# Patient Record
Sex: Male | Born: 1950 | Race: White | Hispanic: No | Marital: Married | Smoking: Former smoker
Health system: Southern US, Community
[De-identification: ages and names within clinical notes are randomized; demographics above are authoritative.]

## PROBLEM LIST (undated history)

## (undated) DIAGNOSIS — Z789 Other specified health status: Secondary | ICD-10-CM

## (undated) DIAGNOSIS — J8410 Pulmonary fibrosis, unspecified: Secondary | ICD-10-CM

## (undated) HISTORY — PX: EYE SURGERY: SHX253

---

## 2018-02-06 ENCOUNTER — Ambulatory Visit: Payer: Self-pay

## 2019-03-18 ENCOUNTER — Other Ambulatory Visit: Payer: Self-pay

## 2019-03-18 ENCOUNTER — Ambulatory Visit: Payer: Self-pay

## 2019-03-18 DIAGNOSIS — Z23 Encounter for immunization: Secondary | ICD-10-CM

## 2019-12-11 ENCOUNTER — Encounter: Payer: Self-pay | Admitting: Emergency Medicine

## 2019-12-11 ENCOUNTER — Inpatient Hospital Stay
Admission: EM | Admit: 2019-12-11 | Discharge: 2019-12-17 | DRG: 196 | Disposition: A | Discharge status: Home / Self Care | Payer: BC Managed Care – PPO | Attending: Hospitalist | Admitting: Hospitalist

## 2019-12-11 ENCOUNTER — Encounter: Payer: Self-pay | Admitting: Registered Nurse

## 2019-12-11 ENCOUNTER — Emergency Department: Payer: BC Managed Care – PPO

## 2019-12-11 ENCOUNTER — Ambulatory Visit: Payer: Self-pay | Admitting: Registered Nurse

## 2019-12-11 ENCOUNTER — Other Ambulatory Visit: Payer: Self-pay

## 2019-12-11 ENCOUNTER — Telehealth: Payer: Self-pay | Admitting: Registered Nurse

## 2019-12-11 VITALS — HR 126 | Resp 20

## 2019-12-11 VITALS — HR 126

## 2019-12-11 DIAGNOSIS — K219 Gastro-esophageal reflux disease without esophagitis: Secondary | ICD-10-CM | POA: Diagnosis present

## 2019-12-11 DIAGNOSIS — J9601 Acute respiratory failure with hypoxia: Secondary | ICD-10-CM | POA: Diagnosis present

## 2019-12-11 DIAGNOSIS — J310 Chronic rhinitis: Secondary | ICD-10-CM

## 2019-12-11 DIAGNOSIS — I5031 Acute diastolic (congestive) heart failure: Secondary | ICD-10-CM | POA: Diagnosis not present

## 2019-12-11 DIAGNOSIS — J189 Pneumonia, unspecified organism: Secondary | ICD-10-CM | POA: Diagnosis present

## 2019-12-11 DIAGNOSIS — R0602 Shortness of breath: Secondary | ICD-10-CM

## 2019-12-11 DIAGNOSIS — Z87891 Personal history of nicotine dependence: Secondary | ICD-10-CM | POA: Diagnosis not present

## 2019-12-11 DIAGNOSIS — Z79899 Other long term (current) drug therapy: Secondary | ICD-10-CM

## 2019-12-11 DIAGNOSIS — R0609 Other forms of dyspnea: Secondary | ICD-10-CM

## 2019-12-11 DIAGNOSIS — Z20822 Contact with and (suspected) exposure to covid-19: Secondary | ICD-10-CM | POA: Diagnosis present

## 2019-12-11 DIAGNOSIS — J84112 Idiopathic pulmonary fibrosis: Principal | ICD-10-CM | POA: Diagnosis present

## 2019-12-11 DIAGNOSIS — F419 Anxiety disorder, unspecified: Secondary | ICD-10-CM | POA: Diagnosis present

## 2019-12-11 DIAGNOSIS — R5383 Other fatigue: Secondary | ICD-10-CM

## 2019-12-11 DIAGNOSIS — R439 Unspecified disturbances of smell and taste: Secondary | ICD-10-CM | POA: Diagnosis present

## 2019-12-11 HISTORY — DX: Other specified health status: Z78.9

## 2019-12-11 LAB — CBC WITH DIFFERENTIAL/PLATELET
Abs Immature Granulocytes: 0.11 10*3/uL — ABNORMAL HIGH (ref 0.00–0.07)
Basophils Absolute: 0.2 10*3/uL — ABNORMAL HIGH (ref 0.0–0.1)
Basophils Relative: 1 %
Eosinophils Absolute: 0.2 10*3/uL (ref 0.0–0.5)
Eosinophils Relative: 1 %
HCT: 45.3 % (ref 39.0–52.0)
Hemoglobin: 14.8 g/dL (ref 13.0–17.0)
Immature Granulocytes: 1 %
Lymphocytes Relative: 9 %
Lymphs Abs: 1.7 10*3/uL (ref 0.7–4.0)
MCH: 29.2 pg (ref 26.0–34.0)
MCHC: 32.7 g/dL (ref 30.0–36.0)
MCV: 89.3 fL (ref 80.0–100.0)
Monocytes Absolute: 1.5 10*3/uL — ABNORMAL HIGH (ref 0.1–1.0)
Monocytes Relative: 8 %
Neutro Abs: 14.3 10*3/uL — ABNORMAL HIGH (ref 1.7–7.7)
Neutrophils Relative %: 80 %
Platelets: 354 10*3/uL (ref 150–400)
RBC: 5.07 MIL/uL (ref 4.22–5.81)
RDW: 13.4 % (ref 11.5–15.5)
WBC: 18 10*3/uL — ABNORMAL HIGH (ref 4.0–10.5)
nRBC: 0 % (ref 0.0–0.2)

## 2019-12-11 LAB — PROCALCITONIN: Procalcitonin: <0.1 ng/mL

## 2019-12-11 LAB — COMPREHENSIVE METABOLIC PANEL
ALT: 16 U/L (ref 0–44)
AST: 27 U/L (ref 15–41)
Albumin: 4 g/dL (ref 3.5–5.0)
Alkaline Phosphatase: 116 U/L (ref 38–126)
Anion gap: 12 (ref 5–15)
BUN: 10 mg/dL (ref 8–23)
CO2: 24 mmol/L (ref 22–32)
Calcium: 9.4 mg/dL (ref 8.9–10.3)
Chloride: 99 mmol/L (ref 98–111)
Creatinine, Ser: 0.85 mg/dL (ref 0.61–1.24)
GFR calc Af Amer: >60 mL/min (ref >60)
GFR calc non Af Amer: >60 mL/min (ref >60)
Glucose, Bld: 115 mg/dL — ABNORMAL HIGH (ref 70–99)
Potassium: 4 mmol/L (ref 3.5–5.1)
Sodium: 135 mmol/L (ref 135–145)
Total Bilirubin: 1.3 mg/dL — ABNORMAL HIGH (ref 0.3–1.2)
Total Protein: 8.9 g/dL — ABNORMAL HIGH (ref 6.5–8.1)

## 2019-12-11 LAB — URINALYSIS, COMPLETE (UACMP) WITH MICROSCOPIC
Bacteria, UA: NONE SEEN
Bilirubin Urine: NEGATIVE
Glucose, UA: NEGATIVE mg/dL
Hgb urine dipstick: NEGATIVE
Ketones, ur: 20 mg/dL — AB
Leukocytes,Ua: NEGATIVE
Nitrite: NEGATIVE
Protein, ur: NEGATIVE mg/dL
Specific Gravity, Urine: >1.046 — ABNORMAL HIGH (ref 1.005–1.030)
pH: 6 (ref 5.0–8.0)

## 2019-12-11 LAB — LACTATE DEHYDROGENASE: LDH: 279 U/L — ABNORMAL HIGH (ref 98–192)

## 2019-12-11 LAB — TRIGLYCERIDES: Triglycerides: 65 mg/dL (ref <150)

## 2019-12-11 LAB — BRAIN NATRIURETIC PEPTIDE: B Natriuretic Peptide: 151.2 pg/mL — ABNORMAL HIGH (ref 0.0–100.0)

## 2019-12-11 LAB — SARS CORONAVIRUS 2 BY RT PCR (HOSPITAL ORDER, PERFORMED IN ~~LOC~~ HOSPITAL LAB): SARS Coronavirus 2: NEGATIVE

## 2019-12-11 LAB — FIBRINOGEN: Fibrinogen: >750 mg/dL — ABNORMAL HIGH (ref 210–475)

## 2019-12-11 LAB — TROPONIN I (HIGH SENSITIVITY)
Troponin I (High Sensitivity): 20 ng/L — ABNORMAL HIGH (ref <18)
Troponin I (High Sensitivity): 20 ng/L — ABNORMAL HIGH (ref <18)

## 2019-12-11 LAB — FERRITIN: Ferritin: 347 ng/mL — ABNORMAL HIGH (ref 24–336)

## 2019-12-11 LAB — POC COVID19 BINAXNOW: SARS Coronavirus 2 Ag: NEGATIVE

## 2019-12-11 LAB — C-REACTIVE PROTEIN: CRP: 14.5 mg/dL — ABNORMAL HIGH (ref <1.0)

## 2019-12-11 LAB — FIBRIN DERIVATIVES D-DIMER (ARMC ONLY): Fibrin derivatives D-dimer (ARMC): 1375.9 ng/mL (FEU) — ABNORMAL HIGH (ref 0.00–499.00)

## 2019-12-11 LAB — LACTIC ACID, PLASMA: Lactic Acid, Venous: 1.8 mmol/L (ref 0.5–1.9)

## 2019-12-11 MED ORDER — ONDANSETRON HCL 4 MG/2ML IJ SOLN: 4.0000 mg | Freq: Four times a day (QID) | INTRAMUSCULAR | Status: DC | PRN

## 2019-12-11 MED ORDER — ONDANSETRON 4 MG PO TBDP
4.0000 mg | ORAL_TABLET | Freq: Three times a day (TID) | ORAL | Status: DC | PRN
  Filled 2019-12-11: qty 1

## 2019-12-11 MED ORDER — ALBUTEROL SULFATE HFA 108 (90 BASE) MCG/ACT IN AERS: 1.0000 | INHALATION_SPRAY | RESPIRATORY_TRACT | 0 refills | Status: DC | PRN

## 2019-12-11 MED ORDER — CALCIUM CARBONATE ANTACID 500 MG PO CHEW: 1.0000 | CHEWABLE_TABLET | Freq: Three times a day (TID) | ORAL | Status: DC | PRN

## 2019-12-11 MED ORDER — SALINE SPRAY 0.65 % NA SOLN: 2.0000 | NASAL | 0 refills | Status: DC

## 2019-12-11 MED ORDER — SODIUM CHLORIDE 0.9 % IV SOLN
2.0000 g | INTRAVENOUS | Status: DC
  Administered 2019-12-11 – 2019-12-12 (×2): 2 g via INTRAVENOUS
  Filled 2019-12-11: qty 2
  Filled 2019-12-11 (×4): qty 20

## 2019-12-11 MED ORDER — DEXAMETHASONE SODIUM PHOSPHATE 10 MG/ML IJ SOLN
10.0000 mg | Freq: Once | INTRAMUSCULAR | Status: AC
  Administered 2019-12-11: 10 mg via INTRAVENOUS
  Filled 2019-12-11: qty 1

## 2019-12-11 MED ORDER — IOHEXOL 350 MG/ML SOLN
75.0000 mL | Freq: Once | INTRAVENOUS | Status: AC | PRN
  Administered 2019-12-11: 75 mL via INTRAVENOUS

## 2019-12-11 MED ORDER — DOCUSATE SODIUM 100 MG PO CAPS: 100.0000 mg | ORAL_CAPSULE | Freq: Two times a day (BID) | ORAL | Status: DC | PRN

## 2019-12-11 MED ORDER — GUAIFENESIN-DM 100-10 MG/5ML PO SYRP
10.0000 mL | ORAL_SOLUTION | Freq: Four times a day (QID) | ORAL | Status: DC | PRN
  Filled 2019-12-11: qty 10

## 2019-12-11 MED ORDER — TRIAMCINOLONE ACETONIDE 55 MCG/ACT NA AERO: 1.0000 | INHALATION_SPRAY | Freq: Two times a day (BID) | NASAL | 0 refills | Status: DC

## 2019-12-11 MED ORDER — ACETAMINOPHEN 500 MG PO TABS: 1000.0000 mg | ORAL_TABLET | Freq: Three times a day (TID) | ORAL | Status: DC | PRN

## 2019-12-11 MED ORDER — ALUM & MAG HYDROXIDE-SIMETH 200-200-20 MG/5ML PO SUSP
15.0000 mL | Freq: Four times a day (QID) | ORAL | Status: DC | PRN
  Filled 2019-12-11: qty 30

## 2019-12-11 MED ORDER — PANTOPRAZOLE SODIUM 40 MG PO TBEC
40.0000 mg | DELAYED_RELEASE_TABLET | Freq: Two times a day (BID) | ORAL | Status: DC
  Administered 2019-12-11 – 2019-12-17 (×12): 40 mg via ORAL
  Filled 2019-12-11 (×12): qty 1

## 2019-12-11 MED ORDER — POLYETHYLENE GLYCOL 3350 17 G PO PACK: 17.0000 g | PACK | Freq: Two times a day (BID) | ORAL | Status: DC | PRN

## 2019-12-11 MED ORDER — ACETAMINOPHEN 500 MG PO TABS: 1000.0000 mg | ORAL_TABLET | Freq: Four times a day (QID) | ORAL | 0 refills | Status: DC | PRN

## 2019-12-11 MED ORDER — SODIUM CHLORIDE 0.9 % IV SOLN
500.0000 mg | INTRAVENOUS | Status: DC
  Administered 2019-12-11: 500 mg via INTRAVENOUS
  Filled 2019-12-11 (×4): qty 500

## 2019-12-11 MED ORDER — ENOXAPARIN SODIUM 40 MG/0.4ML ~~LOC~~ SOLN
40.0000 mg | SUBCUTANEOUS | Status: DC
  Administered 2019-12-11 – 2019-12-16 (×6): 40 mg via SUBCUTANEOUS
  Filled 2019-12-11 (×5): qty 0.4

## 2019-12-11 NOTE — H&P (Addendum)
History and Physical    Donald Sellers YQM:[id number] DOB: 05/16/1951 DOA: 12/11/2019  PCP: Patient, No Pcp Per  Patient coming from: home  I have personally briefly reviewed patient's old medical records in [facility]  Chief Complaint: dyspnea  HPI: Donald Sellers is a 69 y.o. Caucasian male with no significant medical history who presented with acute worsening of dyspnea.   Pt reported severe dyspnea started 2 days ago.  Prior to that, pt had noted gradual development of dyspnea on exertion for the past year.  Has morning cough sometimes with clear sputum production, but in the last 2 days, sputum was green mixed with specks of blood.  No fever, but some sweats at night.  Has been loosing weight, but that's intentional.  Distant hx of smoking but quit in 1980's.  Normal urine output with no hematuria.  Chest tightness sometimes but no chest pain.  No abdominal pain, N/V/D, dysuria, increased swelling.  No hx of heart failure.  No other medical conditions.     ED Course: Initial sat 79% on RA, was placed on 6L [place].  Afebrile, pulse 126, BP 151/92.  Labs notable for WBC 18, Hgb wnl, BNP 151, trop 20 and 20, lactic acid wnl, procal <0.1, COVID neg, CXR showed "Extensive bilateral interstitial opacities throughout both lungs."  CTA neg for PE, but showed "Right-sided volume loss with subpleural architectural distortion/honeycombing. Extensive superimposed ground-glass pulmonary infiltrate" and "Superimposed consolidation within the left upper lobe."  Pt received ceftriaxone, azithromycin and IV deca 10 mg in the ED before admission.   Assessment/Plan Active Problems:   Acute respiratory failure with hypoxia (HCC)  # Acute hypoxic respiratory failure  --needed 6L on presentation.  COVID neg.   CTA neg for PE, but showed "Right-sided volume loss with subpleural architectural distortion/honeycombing. Extensive superimposed ground-glass pulmonary infiltrate" and "Superimposed consolidation  within the left upper lobe."  PLAN: --continue supplemental O2 to maintain sats >=92% --Treat the underlying conditions  # Pulmonary fibrosis --New dx.  Per hx, likely slowly progressing and acutely worsened.  PLAN: --continue supplemental O2 to maintain sats >=92% --Pulm consult  # Left upper lobe PNA --presented with leukocytosis, but no fever and procal neg.  CTA showed consolidation. --started on ceftriaxone and azithro in the ED. PLAN: --continue ceftriaxone and azithro   DVT prophylaxis: Lovenox SQ Code Status: Full code  Family Communication:   Disposition Plan: home  Consults called: pulm consult Admission status: Inpatient   Review of Systems: As per HPI otherwise 10 point review of systems negative.   Past Medical History:  Diagnosis Date  . Patient denies medical problems   Neg for COPD, asthma and CHF   Past Surgical History:  Procedure Laterality Date  . EYE SURGERY       reports that he quit smoking about 41 years ago. His smoking use included cigarettes. He has a 11.00 pack-year smoking history. He has never used smokeless tobacco. He reports current alcohol use. He reports that he does not use drugs.  No Known Allergies  Family History  Problem Relation Age of Onset  . Pulmonary fibrosis Neg Hx      Prior to Admission medications   Medication Sig Start Date End Date Taking? Authorizing Provider  sodium chloride (OCEAN) 0.65 % SOLN nasal spray Place 2 sprays into both nostrils every 2 (two) hours while awake. 12/11/19 01/10/20 Yes Betancourt, Jarold Song, NP  triamcinolone (NASACORT) 55 MCG/ACT AERO nasal inhaler Place 1 spray into the nose 2 (two) times  daily. 12/11/19 01/10/20 Yes Betancourt, Jarold Song, NP  acetaminophen (TYLENOL) 500 MG tablet Take 2 tablets (1,000 mg total) by mouth every 6 (six) hours as needed for up to 3 days for mild pain or moderate pain. 12/11/19 12/14/19  Betancourt, Jarold Song, NP  albuterol (VENTOLIN HFA) 108 (90 Base) MCG/ACT inhaler  Inhale 1-2 puffs into the lungs every 4 (four) hours as needed for wheezing or shortness of breath. 12/11/19 01/10/20  Barbaraann Barthel, NP    Physical Exam: Vitals:   12/11/19 1340 12/11/19 1343 12/11/19 1408 12/11/19 1511  BP:  (!) 151/92    Pulse:    (!) 117  Resp: (!) 40   (!) 24  Temp:   98.4 F (36.9 C)   TempSrc:   Oral   SpO2:    100%  Weight:      Height:        Constitutional: NAD, AAOx3 HEENT: conjunctivae and lids normal, EOMI CV: RRR tachycardic. Distal pulses +2.  No cyanosis.   RESP: fine crackles over posterior bases bilaterally, increased RR, on 4L [place] GI: +BS, NTND Extremities: No effusions, edema, or tenderness in BLE SKIN: warm, dry and intact Neuro: II - XII grossly intact.  Sensation intact Psych: Normal mood and affect.  Appropriate judgement and reason   Labs on Admission: I have personally reviewed following labs and imaging studies  CBC: Recent Labs  Lab 12/11/19 1314  WBC 18.0*  NEUTROABS 14.3*  HGB 14.8  HCT 45.3  MCV 89.3  PLT 354   Basic Metabolic Panel: Recent Labs  Lab 12/11/19 1314  NA 135  K 4.0  CL 99  CO2 24  GLUCOSE 115*  BUN 10  CREATININE 0.85  CALCIUM 9.4   GFR: Estimated Creatinine Clearance: 87.1 mL/min (by C-G formula based on SCr of 0.85 mg/dL). Liver Function Tests: Recent Labs  Lab 12/11/19 1314  AST 27  ALT 16  ALKPHOS 116  BILITOT 1.3*  PROT 8.9*  ALBUMIN 4.0   No results for input(s): LIPASE, AMYLASE in the last 168 hours. No results for input(s): AMMONIA in the last 168 hours. Coagulation Profile: No results for input(s): INR, PROTIME in the last 168 hours. Cardiac Enzymes: No results for input(s): CKTOTAL, CKMB, CKMBINDEX, TROPONINI in the last 168 hours. BNP (last 3 results) No results for input(s): PROBNP in the last 8760 hours. HbA1C: No results for input(s): HGBA1C in the last 72 hours. CBG: No results for input(s): GLUCAP in the last 168 hours. Lipid Profile: Recent Labs     12/11/19 1620  TRIG 65   Thyroid Function Tests: No results for input(s): TSH, T4TOTAL, FREET4, T3FREE, THYROIDAB in the last 72 hours. Anemia Panel: No results for input(s): VITAMINB12, FOLATE, FERRITIN, TIBC, IRON, RETICCTPCT in the last 72 hours. Urine analysis: No results found for: COLORURINE, APPEARANCEUR, LABSPEC, PHURINE, GLUCOSEU, HGBUR, BILIRUBINUR, KETONESUR, PROTEINUR, UROBILINOGEN, NITRITE, LEUKOCYTESUR  Radiological Exams on Admission: CT Angio Chest PE W and/or Wo Contrast  Result Date: 12/11/2019 CLINICAL DATA:  Dyspnea EXAM: CT ANGIOGRAPHY CHEST WITH CONTRAST TECHNIQUE: Multidetector CT imaging of the chest was performed using the standard protocol during bolus administration of intravenous contrast. Multiplanar CT image reconstructions and MIPs were obtained to evaluate the vascular anatomy. CONTRAST:  44mL OMNIPAQUE IOHEXOL 350 MG/ML SOLN COMPARISON:  None. FINDINGS: Cardiovascular: Satisfactory opacification of the pulmonary arteries to the segmental level. No evidence of pulmonary embolism. The central pulmonary arteries are enlarged in keeping with changes of pulmonary arterial hypertension. Moderate coronary artery calcification  primarily within the left anterior descending coronary artery. Global cardiac size within normal limits. No pericardial effusion. Thoracic aorta is unremarkable. Mediastinum/Nodes: There is extensive shotty mediastinal and bilateral hilar adenopathy, likely reactive in nature. No frankly pathologic mediastinal adenopathy. Visualized thyroid is unremarkable. Esophagus is unremarkable. Lungs/Pleura: There is asymmetric right-sided volume loss, similar to that noted on prior chest radiograph. There is peripheral, largely subpleural architectural distortion and honeycombing as well as traction bronchiectasis, best noted within the right upper lobe and lower lobes bilaterally. There is extensive asymmetric ground-glass pulmonary infiltrate throughout the right  lung though small amount is also identified within the left lung base. The degree of architectural distortion is relatively minor. The findings are suggestive of for a broad acute interstitial lung disease and though nonspecific interstitial pneumonia (NSIP) is favored, the differential would include UIP. This is not optimally characterized on this examination. There is, however, more superimposed consolidation within the left upper lobe which appears distinct from the more diffuse pulmonary process and likely represents acute lobar pneumonia in the appropriate clinical setting. No pneumothorax or pleural effusion. No central obstructing mass lesion. Upper Abdomen: Unremarkable Musculoskeletal: No acute bone abnormality. Review of the MIP images confirms the above findings. IMPRESSION: No pulmonary embolism. Right-sided volume loss with subpleural architectural distortion/honeycombing. Extensive superimposed ground-glass pulmonary infiltrate. 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 Follow-up high-resolution CT examination of the chest is recommended in 3 months, after appropriate antibiotic therapy for the patient's underlying acute pneumonia. Pulmonary consultation would be helpful for further management. Superimposed consolidation within the left upper lobe most in keeping with acute lobar pneumonia. Moderate coronary artery calcification. Electronically Signed   By: Helyn Numbers MD   On: 12/11/2019 15:37   DG Chest Portable 1 View  Result Date: 12/11/2019 CLINICAL DATA:  Shortness of breath EXAM: PORTABLE CHEST 1 VIEW COMPARISON:  None. FINDINGS: Heart size is within normal limits. Low lung volumes. Extensive bilateral interstitial opacities throughout both lungs. No large pleural fluid collection. No pneumothorax. IMPRESSION: Extensive bilateral interstitial opacities throughout both lungs. Findings may represent pulmonary edema  versus multifocal atypical/viral infection. Electronically Signed   By: Duanne Guess D.O.   On: 12/11/2019 14:22      Darlin Priestly MD [facility]  If 7PM-7AM, please contact night-coverage 12/11/2019, 5:14 PM

## 2019-12-11 NOTE — Progress Notes (Signed)
Subjective:    Patient ID: Donald Sellers, male    DOB: Oct 03, 1950, 69 y.o.   MRN: [id number]  68y/o caucasian male has reported some dyspnea with exertion ongoing over the past couple of months but worsening this week.  Typically he has to stop when walking across campus to rest and it takes him 10 minutes to walk across campus.  Patient has reported lethargy, fatigue, shortness of breath with exertion worsening on Thursday.  Loss of appetite only had ramen and biscuit yesterday.  Today showered.  Rhinitis this week clear yellow earlier in the week and now green.  Not cloudy or white or bloody.  Post nasal drip.  When walking across campus has been having to stop to take a break.  Wife smoker married 40+ years.  He was smoker and quit in the 1980s usually 1 PPD started age 18.  Wondering if this is COPD.  Patient has no known covid contacts.  Pfizer covid vaccine received Dose 1 Feb and Dose 21 July 2019.  No known covid contacts.  Patient taking aspirin, alkaseltzer cold (antihistamine and decongestent), guaifenesin prn cough/mucous and pseudoephedrine 30mg  2 tabs prn rhinitis, tylenol ER.  Back pain today and that is why he took tylenol.  Denied fever/chills but has been sweating more this week.  Temps have been 96-99 used 2 different thermometers.  Patient has used flonase in the past but stopped as he was getting frequent nose bleeds.  Uses nasal saline during allergy season but not recently.  Typically doesn't go to the doctor.  Receives vaccines at [facility] but hasn't had an annual physical or labs.  Does not get seen at [facility] either.  Typically treats any illness with OTC.  Patient consented to video visit and entire visit completed via video.  Duration of video visit 32 minutes.  Patient denied n/v/d/fever/chills.     Review of Systems  Constitutional: Positive for activity change, appetite change, diaphoresis and fatigue. Negative for chills, fever and unexpected weight change.  HENT:  Positive for congestion, postnasal drip and rhinorrhea. Negative for ear discharge, ear pain, facial swelling, trouble swallowing and voice change.   Eyes: Negative for photophobia, pain, discharge, redness, itching and visual disturbance.  Respiratory: Positive for cough and shortness of breath. Negative for choking, chest tightness, wheezing and stridor.   Cardiovascular: Negative for chest pain and palpitations.  Gastrointestinal: Negative for abdominal pain, diarrhea, nausea and vomiting.  Endocrine: Negative for cold intolerance and heat intolerance.  Genitourinary: Negative for difficulty urinating.  Musculoskeletal: Positive for back pain. Negative for arthralgias, gait problem, joint swelling, myalgias, neck pain and neck stiffness.  Allergic/Immunologic: Positive for environmental allergies. Negative for food allergies.  Neurological: Positive for weakness. Negative for dizziness, tremors, seizures, syncope, facial asymmetry, speech difficulty, light-headedness, numbness and headaches.  Hematological: Negative for adenopathy. Does not bruise/bleed easily.  Psychiatric/Behavioral: Negative for agitation, confusion and sleep disturbance.       Objective:   Physical Exam Vitals and nursing note reviewed.  Constitutional:      General: He is awake.     Appearance: He is well-developed, well-groomed and overweight. He is ill-appearing. He is not toxic-appearing or diaphoretic.  HENT:     Head: Normocephalic and atraumatic.     Jaw: There is normal jaw occlusion.     Salivary Glands: Right salivary gland is not diffusely enlarged. Left salivary gland is not diffusely enlarged.     Right Ear: Hearing and external ear normal.     Left Ear:  Hearing and external ear normal.     Nose: Congestion and rhinorrhea present.     Mouth/Throat:     Lips: Pink. No lesions.     Mouth: Mucous membranes are moist.     Pharynx: Oropharynx is clear.  Eyes:     General: Vision grossly intact. Gaze  aligned appropriately. No visual field deficit or scleral icterus.       Right eye: No discharge.        Left eye: No discharge.     Extraocular Movements: Extraocular movements intact.     Conjunctiva/sclera: Conjunctivae normal.     Pupils: Pupils are equal, round, and reactive to light.  Neck:     Trachea: Trachea and phonation normal. No tracheostomy or tracheal deviation.  Cardiovascular:     Rate and Rhythm: Tachycardia present. Rhythm irregular.  Pulmonary:     Effort: No respiratory distress.     Breath sounds: Normal air entry. No stridor. No wheezing.     Comments: Patient with nasal congestion and occasional throat clearing on video visit; no cough observed; taking a breath midsentence but speaking full sentences without difficulty Musculoskeletal:        General: Normal range of motion.     Right shoulder: Normal.     Left shoulder: Normal.     Right elbow: Normal.     Left elbow: Normal.     Right hand: Normal.     Left hand: Normal.     Cervical back: Normal. No swelling, edema, deformity, erythema, signs of trauma, lacerations, rigidity, torticollis or crepitus. No pain with movement. Normal range of motion.     Thoracic back: Normal.     Comments: Observed patient in chair during video visit; gait not observed  Lymphadenopathy:     Head:     Right side of head: No submental, submandibular or preauricular adenopathy.     Left side of head: No submental, submandibular or preauricular adenopathy.     Cervical:     Right cervical: No superficial cervical adenopathy.    Left cervical: No superficial cervical adenopathy.  Skin:    General: Skin is warm and dry.     Capillary Refill: Capillary refill takes less than 2 seconds.     Coloration: Skin is not ashen, cyanotic, jaundiced, mottled, pale or sallow.     Findings: No abrasion, abscess, acne, bruising, burn, ecchymosis, erythema, signs of injury, laceration, lesion, petechiae, rash or wound.     Nails: There is no  clubbing.  Neurological:     General: No focal deficit present.     Mental Status: He is alert and oriented to person, place, and time.     Cranial Nerves: Cranial nerves are intact. No cranial nerve deficit, dysarthria or facial asymmetry.     Motor: Motor function is intact. No weakness, tremor, atrophy, abnormal muscle tone or seizure activity.     Coordination: Coordination is intact.  Psychiatric:        Attention and Perception: Attention and perception normal.        Mood and Affect: Mood and affect normal.        Speech: Speech normal.        Behavior: Behavior normal. Behavior is cooperative.        Thought Content: Thought content normal.        Cognition and Memory: Cognition and memory normal.        Judgment: Judgment normal.  I spent 60 minutes dedicated to the care of this patient on the date of this encounter to include pre-visit review of Epic encounters during the past year, results review, care everywhere, allergies, medical history, surgical history, family medical history, medications; face to face time with the patient, and post visit ordering of medications and writing patient instructions/attaching handouts to my chart account.  1251  Patient arrived to clinic via POV with spouse and Covid POCT testing sample obtained by Janee Morn CMA and pending results in 12 minutes.  SP02 86% RA pulse 126 and irregular.  Patient contacted via telephone and notified that I recommended he go to ER of his preference for further evaluation and treatment as he may need supplemental oxygen, medications and further work up that is not available at [facility] clinic e.g. chest xray, EKG, labs.  Discussed possible differential diagnoses covid, URI, pneumonia, COPD, bronchitis, afib.  Patient and spouse agreed with plan of care and had no further questions at this time.  Report called to triage nurse Tiffany at 1259.  Covid POCT results still pending.  Patient stated he was driving with  spouse from [facility] straight to ER for further evaluation.  POCT Covid test negative patient notified via telephone and verbalized understanding information and had no further questions at this time.  He has checked into [facility] ER and awaiting triage nurse.  Discussed with him that I called report to Tiffany RN earlier today.     Assessment & Plan:  A-dyspnea with exertion, rhinitis, fatigue  P-POCT covid today. Patient may continue OTC mucinex use prn discussed recommended not taking prior to bedtime as promotes coughing which would interrupt sleep.  Electronic Rx. Cough lozenges po q2h prn cough or OTC cough medicine such as robitussin.  Albuterol MDI 1-2 puffs po q4-6h prn protracted cough/wheeze #1 RF0 side effect increased heart rate.  Discussed may be cheaper for him to use Good Rx coupon then insurance per information that showed on my screens today.  Good Rx albuterol $25 versus insurance copay $90.  Patient preferred pharmacy [facility] on [address] not in [employer]. Possible bronchitis Viruses are the most common cause of bronchial inflammation in otherwise healthy adults with acute bronchitis.  The appearance of sputum is not predictive of whether a bacterial infection is present.  Purulent sputum is most often caused by viral infections.  There are a small portion of those caused by non-viral agents being Mycoplama pneumonia.  Microscopic examination or C&S of sputum in the healthy adult with acute bronchitis is generally not helpful (usually negative or normal respiratory flora) other considerations being cough from upper respiratory tract infections, sinusitis or allergic syndromes (mild asthma or viral pneumonia).  Differential Diagnoses:  reactive airway disease (asthma, allergic aspergillosis (eosinophilia), chronic bronchitis, respiratory infection (sinusitis, common cold, pneumonia), congestive heart failure, reflux esophagitis, bronchogenic tumor, aspiration syndromes  and/or exposure to pulmonary irritants/smoke.  Without high fever, severe dyspnea, lack of physical findings or other risk factors, I will hold on a chest radiograph and CBC at this time.  I discussed that approximately 50% of patients with acute bronchitis have a cough that lasts up to three weeks, and 25% for over a month.  Tylenol 500mg  one to two tablets every four to six hours as needed for fever or myalgias.  No aspirin. Exitcare handout on shortness of breath, covid 19, and albuterol inhaler use.  Patient notified will given him printed AVS when he arrives to clinic  for covid testing in parking lot.  ER if hemopthysis, SOB, worst chest pain of life.   Patient instructed to follow up in one week or sooner if symptoms worsen.  Patient verbalized agreement and understanding of treatment plan.  P2:  hand washing and cover cough   Patient may use normal saline nasal spray 2 sprays each nostril q2h wa as needed. Flonase causes nosebleeds in past.  Trial nasacort/triamcinolone nasal 1 spray each nostril BID #1 RF0 electronic Rx to his pharmacy of choice.  Patient denied personal or family history of ENT cancer.  OTC antihistamine of choice example claritin/zyrtec  po daily.  Avoid triggers if possible.  Shower prior to bedtime if exposed to triggers.  If allergic dust/dust mites recommend mattress/pillow covers/encasements; washing linens, vacuuming, sweeping, dusting weekly.  Call or return to clinic as needed if these symptoms worsen or fail to improve as anticipated.   Exitcare handout on nonallergic rhinitis, allergic rhinitis and sinus rinse.  Kim to give him printed copy of handouts when he arrives to parking lot for covid testing.  Patient verbalized understanding of instructions, agreed with plan of care and had no further questions at this time.   Fatigue new could be related to viral URI or chronic dyspnea on exertion recently worsened ?hypoxia.  I recommend full physical with PA Ratcliffe in  the next 2 weeks with annual labs. Consider chest xray as patient with history smoking 1PPD quit 1980s and exposure second hand smoke from spouse x 40+ years.   Patient to schedule appt.  Patient verbalized understanding information/instructions, agreed with plan of care and had no further questions at this time. P2:  Avoidance and hand washing.

## 2019-12-11 NOTE — ED Provider Notes (Signed)
[facility] Emergency Department Provider Note    First MD Initiated Contact with Patient 12/11/19 1332     (approximate)  I have reviewed the triage vital signs and the nursing notes.   HISTORY  Chief Complaint Shortness of Breath    HPI Donald Sellers is a 69 y.o. male presents to the ER for evaluation of worsening shortness of breath cough and congestion.  States been progressively worsening over past several days and is having decreased oral intake and loss of taste for things.  Denies any recent illnesses.  Not currently on antibiotics.  Does not wear home oxygen.   Denies any history of COPD or asthma.  No history of CHF or CAD.   Past Medical History:  Diagnosis Date  . Patient denies medical problems    History reviewed. No pertinent family history. Past Surgical History:  Procedure Laterality Date  . EYE SURGERY     Patient Active Problem List   Diagnosis Date Noted  . Acute respiratory failure with hypoxia (HCC) 12/11/2019      Prior to Admission medications   Medication Sig Start Date End Date Taking? Authorizing Provider  sodium chloride (OCEAN) 0.65 % SOLN nasal spray Place 2 sprays into both nostrils every 2 (two) hours while awake. 12/11/19 01/10/20 Yes Betancourt, Jarold Songina A, NP  triamcinolone (NASACORT) 55 MCG/ACT AERO nasal inhaler Place 1 spray into the nose 2 (two) times daily. 12/11/19 01/10/20 Yes Betancourt, Jarold Songina A, NP  acetaminophen (TYLENOL) 500 MG tablet Take 2 tablets (1,000 mg total) by mouth every 6 (six) hours as needed for up to 3 days for mild pain or moderate pain. 12/11/19 12/14/19  Betancourt, Jarold Songina A, NP  albuterol (VENTOLIN HFA) 108 (90 Base) MCG/ACT inhaler Inhale 1-2 puffs into the lungs every 4 (four) hours as needed for wheezing or shortness of breath. 12/11/19 01/10/20  Betancourt, Jarold Songina A, NP    Allergies Patient has no known allergies.    Social History Social History   Tobacco Use  . Smoking status: Former  Smoker    Packs/day: 1.00    Years: 11.00    Pack years: 11.00    Types: Cigarettes    Quit date: 1980    Years since quitting: 41.5  . Smokeless tobacco: Never Used  Substance Use Topics  . Alcohol use: Yes  . Drug use: Never    Review of Systems Patient denies headaches, rhinorrhea, blurry vision, numbness, shortness of breath, chest pain, edema, cough, abdominal pain, nausea, vomiting, diarrhea, dysuria, fevers, rashes or hallucinations unless otherwise stated above in HPI. ____________________________________________   PHYSICAL EXAM:  VITAL SIGNS: Vitals:   12/11/19 1408 12/11/19 1511  BP:    Pulse:  (!) 117  Resp:  (!) 24  Temp: 98.4 F (36.9 C)   SpO2:  100%    Constitutional: Alert and oriented.  Ill-appearing dyspneic Eyes: Conjunctivae are normal.  Head: Atraumatic. Nose: No congestion/rhinnorhea. Mouth/Throat: Mucous membranes are moist.   Neck: No stridor. Painless ROM.  Cardiovascular: Normal rate, regular rhythm. Grossly normal heart sounds.  Good peripheral circulation. Respiratory: Tachypnea with posterior inspiratory crackles.  Does have acute hypoxic respiratory failure requiring supplemental oxygen.  Gastrointestinal: Soft and nontender. No distention. No abdominal bruits. No CVA tenderness. Genitourinary:  Musculoskeletal: No lower extremity tenderness nor edema.  No joint effusions. Neurologic:  Normal speech and language. No gross focal neurologic deficits are appreciated. No facial droop Skin:  Skin is warm, dry and intact. No rash noted. Psychiatric: Mood and  affect are normal. Speech and behavior are normal.  ____________________________________________   LABS (all labs ordered are listed, but only abnormal results are displayed)  Results for orders placed or performed during the hospital encounter of 12/11/19 (from the past 24 hour(s))  CBC with Differential/Platelet     Status: Abnormal   Collection Time: 12/11/19  1:14 PM  Result Value  Ref Range   WBC 18.0 (H) 4.0 - 10.5 K/uL   RBC 5.07 4.22 - 5.81 MIL/uL   Hemoglobin 14.8 13.0 - 17.0 g/dL   HCT 24.2 39 - 52 %   MCV 89.3 80.0 - 100.0 fL   MCH 29.2 26.0 - 34.0 pg   MCHC 32.7 30.0 - 36.0 g/dL   RDW 68.3 41.9 - 62.2 %   Platelets 354 150 - 400 K/uL   nRBC 0.0 0.0 - 0.2 %   Neutrophils Relative % 80 %   Neutro Abs 14.3 (H) 1.7 - 7.7 K/uL   Lymphocytes Relative 9 %   Lymphs Abs 1.7 0.7 - 4.0 K/uL   Monocytes Relative 8 %   Monocytes Absolute 1.5 (H) 0 - 1 K/uL   Eosinophils Relative 1 %   Eosinophils Absolute 0.2 0 - 0 K/uL   Basophils Relative 1 %   Basophils Absolute 0.2 (H) 0 - 0 K/uL   Immature Granulocytes 1 %   Abs Immature Granulocytes 0.11 (H) 0.00 - 0.07 K/uL  Comprehensive metabolic panel     Status: Abnormal   Collection Time: 12/11/19  1:14 PM  Result Value Ref Range   Sodium 135 135 - 145 mmol/L   Potassium 4.0 3.5 - 5.1 mmol/L   Chloride 99 98 - 111 mmol/L   CO2 24 22 - 32 mmol/L   Glucose, Bld 115 (H) 70 - 99 mg/dL   BUN 10 8 - 23 mg/dL   Creatinine, Ser 2.97 0.61 - 1.24 mg/dL   Calcium 9.4 8.9 - 98.9 mg/dL   Total Protein 8.9 (H) 6.5 - 8.1 g/dL   Albumin 4.0 3.5 - 5.0 g/dL   AST 27 15 - 41 U/L   ALT 16 0 - 44 U/L   Alkaline Phosphatase 116 38 - 126 U/L   Total Bilirubin 1.3 (H) 0.3 - 1.2 mg/dL   GFR calc non Af Amer >60 >60 mL/min   GFR calc Af Amer >60 >60 mL/min   Anion gap 12 5 - 15  Lactic acid, plasma     Status: None   Collection Time: 12/11/19  1:14 PM  Result Value Ref Range   Lactic Acid, Venous 1.8 0.5 - 1.9 mmol/L  Procalcitonin     Status: None   Collection Time: 12/11/19  1:14 PM  Result Value Ref Range   Procalcitonin <0.10 ng/mL  Brain natriuretic peptide     Status: Abnormal   Collection Time: 12/11/19  1:14 PM  Result Value Ref Range   B Natriuretic Peptide 151.2 (H) 0.0 - 100.0 pg/mL  Troponin I (High Sensitivity)     Status: Abnormal   Collection Time: 12/11/19  1:14 PM  Result Value Ref Range   Troponin I  (High Sensitivity) 20 (H) <18 ng/L  SARS Coronavirus 2 by RT PCR (hospital order, performed in [facility] hospital lab) Nasopharyngeal Nasopharyngeal Swab     Status: None   Collection Time: 12/11/19  2:02 PM   Specimen: Nasopharyngeal Swab  Result Value Ref Range   SARS Coronavirus 2 NEGATIVE NEGATIVE   ____________________________________________  EKG My review and personal interpretation  at Time: 13:40   Indication: sob  Rate: 130  Rhythm: sinus Axis: normal Other: nonspecific st abn, no stemi, abnml ekg ____________________________________________  RADIOLOGY  I personally reviewed all radiographic images ordered to evaluate for the above acute complaints and reviewed radiology reports and findings.  These findings were personally discussed with the patient.  Please see medical record for radiology report.  ____________________________________________   PROCEDURES  Procedure(s) performed:  .Critical Care Performed by: Willy Eddy, MD Authorized by: Willy Eddy, MD   Critical care provider statement:    Critical care time (minutes):  40   Critical care time was exclusive of:  Separately billable procedures and treating other patients   Critical care was necessary to treat or prevent imminent or life-threatening deterioration of the following conditions:  Respiratory failure   Critical care was time spent personally by me on the following activities:  Development of treatment plan with patient or surrogate, discussions with consultants, evaluation of patient's response to treatment, examination of patient, obtaining history from patient or surrogate, ordering and performing treatments and interventions, ordering and review of laboratory studies, ordering and review of radiographic studies, pulse oximetry, re-evaluation of patient's condition and review of old charts      Critical Care performed: yes ____________________________________________   INITIAL  IMPRESSION / ASSESSMENT AND PLAN / ED COURSE  Pertinent labs & imaging results that were available during my care of the patient were reviewed by me and considered in my medical decision making (see chart for details).   DDX: Asthma, copd, CHF, pna, ptx, malignancy, Pe, anemia   Vartan Kerins is a 69 y.o. who presents to the ED with respiratory symptoms and presentation as described above.  Patient is afebrile but tachycardic and hypoxic requiring supplemental oxygen.  He is protecting his airway but tachypneic.  Blood work as well as imaging will be ordered for the above differential.  Clinical Course as of Dec 11 1655  Fri Dec 11, 2019  1452 Lactate normal.  Does have significantly elevated white count.  I am highly suspicious for viral pneumonia including Covid based on presentation.  Does not have any other signs or symptoms of congestive heart failure but edema is on the differential.  Diastolic pressures are not that elevated.  Did receive antibiotics for community-acquired pneumonia.  He is satting well on supplemental oxygen.   [PR]  1605 Given findings on CTA with absence of PE will give dose of steroid given concern for possible NSIP and inflammatory process have ordered decadron.  Will discuss with hospitalist for admission.   [PR]    Clinical Course User Index [PR] Willy Eddy, MD    The patient was evaluated in Emergency Department today for the symptoms described in the history of present illness. He/she was evaluated in the context of the global COVID-19 pandemic, which necessitated consideration that the patient might be at risk for infection with the SARS-CoV-2 virus that causes COVID-19. Institutional protocols and algorithms that pertain to the evaluation of patients at risk for COVID-19 are in a state of rapid change based on information released by regulatory bodies including the CDC and federal and state organizations. These policies and algorithms were followed during  the patient's care in the ED.  As part of my medical decision making, I reviewed the following data within the electronic MEDICAL RECORD NUMBER Nursing notes reviewed and incorporated, Labs reviewed, notes from prior ED visits and [place] Controlled Substance Database   ____________________________________________   FINAL CLINICAL IMPRESSION(S) / ED  DIAGNOSES  Final diagnoses:  Acute respiratory failure with hypoxia (HCC)      NEW MEDICATIONS STARTED DURING THIS VISIT:  New Prescriptions   No medications on file     Note:  This document was prepared using Dragon voice recognition software and may include unintentional dictation errors.    Willy Eddy, MD 12/11/19 [phone]

## 2019-12-11 NOTE — Consult Note (Signed)
Pulmonary Medicine          Date: 12/11/2019,   MRN# [id number] Donald Sellers 04-19-1951     AdmissionWeight: 81.6 kg                 CurrentWeight: 81.6 kg  Referring physician: Dr Fran Lowes    CHIEF COMPLAINT:   Acute hypoxemic respiratory failure   HISTORY OF PRESENT ILLNESS   Donald Sellers with no PMH came in with chief complaint of SOB/DOE 2d. He had noted worsening cough and phlegm on expectoration. He was asmoker in past. He was found to be acutely hypoxemic but shares that DOE has been progressively getting worse over past 1 year.Initial sat 79% on RA, was placed on 6L [place].  Afebrile, pulse 126, BP 151/92.  Labs notable for WBC 18, Hgb wnl, BNP 151, trop 20 and 20, lactic acid wnl, procal <0.1, COVID neg, CXR showed Extensive bilateral interstitial opacities throughout both lungs. CTA neg for PE, but showed Right-sided volume loss with subpleural architectural distortion/honeycombing. Extensive superimposed ground-glass pulmonary infiltrate and Superimposed consolidation within the left upper lobe.  Pt received ceftriaxone, azithromycin and IV deca 10 mg in the ED before admission.  Pulmonary consultation for further management of fibrotic lung disease with acute hypoxemia.    PAST MEDICAL HISTORY   Past Medical History:  Diagnosis Date  . Patient denies medical problems      SURGICAL HISTORY   Past Surgical History:  Procedure Laterality Date  . EYE SURGERY       FAMILY HISTORY   Family History  Problem Relation Age of Onset  . Pulmonary fibrosis Neg Hx      SOCIAL HISTORY   Social History   Tobacco Use  . Smoking status: Former Smoker    Packs/day: 1.00    Years: 11.00    Pack years: 11.00    Types: Cigarettes    Quit date: 1980    Years since quitting: 41.5  . Smokeless tobacco: Never Used  Substance Use Topics  . Alcohol use: Yes  . Drug use: Never     MEDICATIONS    Home Medication:  Current Outpatient Rx  . Order #: [id number] Class: OTC   . Order #: [id number] Class: Normal  . Order #: [id number] Class: OTC  . Order #: [id number] Class: Normal    Current Medication:  Current Facility-Administered Medications:  .  acetaminophen (TYLENOL) tablet 1,000 mg, 1,000 mg, Oral, Q8H PRN, Darlin Priestly, MD .  alum & mag hydroxide-simeth (MAALOX/MYLANTA) 200-200-20 MG/5ML suspension 15 mL, 15 mL, Oral, Q6H PRN, Darlin Priestly, MD .  azithromycin (ZITHROMAX) 500 mg in sodium chloride 0.9 % 250 mL IVPB, 500 mg, Intravenous, Q24H, Willy Eddy, MD, Stopped at 12/11/19 1610 .  calcium carbonate (TUMS - dosed in mg elemental calcium) chewable tablet 200 mg of elemental calcium, 1 tablet, Oral, TID PRN, Darlin Priestly, MD .  cefTRIAXone (ROCEPHIN) 2 g in sodium chloride 0.9 % 100 mL IVPB, 2 g, Intravenous, Q24H, Willy Eddy, MD, Stopped at 12/11/19 1527 .  docusate sodium (COLACE) capsule 100 mg, 100 mg, Oral, BID PRN, Darlin Priestly, MD .  guaiFENesin-dextromethorphan (ROBITUSSIN DM) 100-10 MG/5ML syrup 10 mL, 10 mL, Oral, Q6H PRN, Darlin Priestly, MD .  ondansetron [facility]) injection 4 mg, 4 mg, Intravenous, Q6H PRN, Darlin Priestly, MD .  ondansetron (ZOFRAN-ODT) disintegrating tablet 4 mg, 4 mg, Oral, Q8H PRN, Darlin Priestly, MD .  polyethylene glycol (MIRALAX / GLYCOLAX) packet 17 g, 17 g, Oral, BID PRN, Darlin Priestly, MD  Current Outpatient Medications:  .  sodium chloride (OCEAN) 0.65 % SOLN nasal spray, Place 2 sprays into both nostrils every 2 (two) hours while awake., Disp: , Rfl: 0 .  triamcinolone (NASACORT) 55 MCG/ACT AERO nasal inhaler, Place 1 spray into the nose 2 (two) times daily., Disp: 1 Inhaler, Rfl: 0 .  acetaminophen (TYLENOL) 500 MG tablet, Take 2 tablets (1,000 mg total) by mouth every 6 (six) hours as needed for up to 3 days for mild pain or moderate pain., Disp: 8 tablet, Rfl: 0 .  albuterol (VENTOLIN HFA) 108 (90 Base) MCG/ACT inhaler, Inhale 1-2 puffs into the lungs every 4 (four) hours as needed for wheezing or shortness of breath., Disp: 6.7 g,  Rfl: 0    ALLERGIES   Patient has no known allergies.     REVIEW OF SYSTEMS    Review of Systems:  Gen:  Denies  fever, sweats, chills weigh loss  HEENT: Denies blurred vision, double vision, ear pain, eye pain, hearing loss, nose bleeds, sore throat Cardiac:  No dizziness, chest pain or heaviness, chest tightness,edema Resp:   Denies cough or sputum porduction, shortness of breath,wheezing, hemoptysis,  Gi: Denies swallowing difficulty, stomach pain, nausea or vomiting, diarrhea, constipation, bowel incontinence Gu:  Denies bladder incontinence, burning urine Ext:   Denies Joint pain, stiffness or swelling Skin: Denies  skin rash, easy bruising or bleeding or hives Endoc:  Denies polyuria, polydipsia , polyphagia or weight change Psych:   Denies depression, insomnia or hallucinations   Other:  All other systems negative   VS: BP (!) 151/92   Pulse (!) 117   Temp 98.4 F (36.9 C) (Oral)   Resp (!) 24   Ht 5\' 10"  (1.778 m)   Wt 81.6 kg   SpO2 100%   BMI 25.83 kg/m      PHYSICAL EXAM    GENERAL:NAD, no fevers, chills, no weakness no fatigue HEAD: Normocephalic, atraumatic.  EYES: Pupils equal, round, reactive to light. Extraocular muscles intact. No scleral icterus.  MOUTH: Moist mucosal membrane. Dentition intact. No abscess noted.  EAR, NOSE, THROAT: Clear without exudates. No external lesions.  NECK: Supple. No thyromegaly. No nodules. No JVD.  PULMONARY: Diffuse coarse rhonchi right sided +wheezes CARDIOVASCULAR: S1 and S2. Regular rate and rhythm. No murmurs, rubs, or gallops. No edema. Pedal pulses 2+ bilaterally.  GASTROINTESTINAL: Soft, nontender, nondistended. No masses. Positive bowel sounds. No hepatosplenomegaly.  MUSCULOSKELETAL: No swelling, clubbing, or edema. Range of motion full in all extremities.  NEUROLOGIC: Cranial nerves II through XII are intact. No gross focal neurological deficits. Sensation intact. Reflexes intact.  SKIN: No ulceration,  lesions, rashes, or cyanosis. Skin warm and dry. Turgor intact.  PSYCHIATRIC: Mood, affect within normal limits. The patient is awake, alert and oriented x 3. Insight, judgment intact.       IMAGING    CT Angio Chest PE W and/or Wo Contrast  Result Date: 12/11/2019 CLINICAL DATA:  Dyspnea EXAM: CT ANGIOGRAPHY CHEST WITH CONTRAST TECHNIQUE: Multidetector CT imaging of the chest was performed using the standard protocol during bolus administration of intravenous contrast. Multiplanar CT image reconstructions and MIPs were obtained to evaluate the vascular anatomy. CONTRAST:  9mL OMNIPAQUE IOHEXOL 350 MG/ML SOLN COMPARISON:  None. FINDINGS: Cardiovascular: Satisfactory opacification of the pulmonary arteries to the segmental level. No evidence of pulmonary embolism. The central pulmonary arteries are enlarged in keeping with changes of pulmonary arterial hypertension. Moderate coronary artery calcification primarily within the left anterior descending coronary artery. Global cardiac size  within normal limits. No pericardial effusion. Thoracic aorta is unremarkable. Mediastinum/Nodes: There is extensive shotty mediastinal and bilateral hilar adenopathy, likely reactive in nature. No frankly pathologic mediastinal adenopathy. Visualized thyroid is unremarkable. Esophagus is unremarkable. Lungs/Pleura: There is asymmetric right-sided volume loss, similar to that noted on prior chest radiograph. There is peripheral, largely subpleural architectural distortion and honeycombing as well as traction bronchiectasis, best noted within the right upper lobe and lower lobes bilaterally. There is extensive asymmetric ground-glass pulmonary infiltrate throughout the right lung though small amount is also identified within the left lung base. The degree of architectural distortion is relatively minor. The findings are suggestive of for a broad acute interstitial lung disease and though nonspecific interstitial pneumonia  (NSIP) is favored, the differential would include UIP. This is not optimally characterized on this examination. There is, however, more superimposed consolidation within the left upper lobe which appears distinct from the more diffuse pulmonary process and likely represents acute lobar pneumonia in the appropriate clinical setting. No pneumothorax or pleural effusion. No central obstructing mass lesion. Upper Abdomen: Unremarkable Musculoskeletal: No acute bone abnormality. Review of the MIP images confirms the above findings. IMPRESSION: No pulmonary embolism. Right-sided volume loss with subpleural architectural distortion/honeycombing. Extensive superimposed ground-glass pulmonary infiltrate. 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 Follow-up high-resolution CT examination of the chest is recommended in 3 months, after appropriate antibiotic therapy for the patient's underlying acute pneumonia. Pulmonary consultation would be helpful for further management. Superimposed consolidation within the left upper lobe most in keeping with acute lobar pneumonia. Moderate coronary artery calcification. Electronically Signed   By: Helyn Numbers MD   On: 12/11/2019 15:37   DG Chest Portable 1 View  Result Date: 12/11/2019 CLINICAL DATA:  Shortness of breath EXAM: PORTABLE CHEST 1 VIEW COMPARISON:  None. FINDINGS: Heart size is within normal limits. Low lung volumes. Extensive bilateral interstitial opacities throughout both lungs. No large pleural fluid collection. No pneumothorax. IMPRESSION: Extensive bilateral interstitial opacities throughout both lungs. Findings may represent pulmonary edema versus multifocal atypical/viral infection. Electronically Signed   By: Duanne Guess D.O.   On: 12/11/2019 14:22      ASSESSMENT/PLAN   Acute exacerbation of Idiopathic pulmonary fibosis   - there is left upper lobe consolidation suggestive of  acute infectious cause of current exacerbation   - will obtain serology for ANA comprehensive, cryptococcal antigen, aspergillus ab, fungitell   - Respiratory viral panel    - sputum bacterial cultures   - procalcitonin trend   - MRSA PCR   - empiric low dose steroids      GERD   -this should be very tightly controlled while in acute exacerbation of IPF - will start protonix 40 bid     Thank you for allowing me to participate in the care of this patient.   Patient/Family are satisfied with care plan and all questions have been answered.  This document was prepared using Dragon voice recognition software and may include unintentional dictation errors.     Vida Rigger, M.D.  Division of Pulmonary & Critical Care Medicine  [facility]

## 2019-12-11 NOTE — ED Triage Notes (Signed)
Here for [facility].  Had rapid covid test that was negative.  Arrived sat 79% RA, placed on 6 L [place]. sats 90 6 L. Labored. Reports gradual SHOB but then got acutely worse. Denies pain or fever.

## 2019-12-11 NOTE — Patient Instructions (Addendum)
Please go to clinic for covid testing before 2pm today call Belinda when you are in clinic parking lot May continue mucinex and sudafed as needed.  Max sudafed is 245m per 24 hours (8- 375mpills) Start nasal saline 2 sprays each nostril every 2 hours while awake if congested or to wash out mucous Start nasacort/generic triamcinolone 1 spray each nostril twice a day Start albuterol inhaler 1-2 puffs every 4-6 hours for shortness of breath or if protracted coughing You may use over the counter robitissun/cough medicine as needed; honey with lemon is a natural cough suppressent and typically lasts 4 hours also Tylenol [id number] mouth every 6 hours as needed for pain/fever  If covid test negative schedule a follow up with [facility] in 1-2 weeks for annual physical/check up and labs  If covid test positive you will need to quarantine at home for 10 days  Please let me know if you need a work excuse note ER if worsening difficulty breathing, blue lips, chest pain, confusion or loss of consciousness   Albuterol inhalation aerosol What is this medicine? ALBUTEROL (al BYONormajean Glasgows a bronchodilator. It helps open up the airways in your lungs to make it easier to breathe. This medicine is used to treat and to prevent bronchospasm. This medicine may be used for other purposes; ask your health care provider or pharmacist if you have questions. COMMON BRAND NAME(S): Proair HFA, Proventil, Proventil HFA, Respirol, Ventolin, Ventolin HFA What should I tell my health care provider before I take this medicine? They need to know if you have any of the following conditions: diabetes heart disease or irregular heartbeat high blood pressure pheochromocytoma seizures thyroid disease an unusual or allergic reaction to albuterol, levalbuterol, other medicines, foods, dyes, or preservatives pregnant or trying to get pregnant breast-feeding How should I use this medicine? This medicine is for inhalation  through the mouth. Follow the directions on your prescription label. Take your medicine at regular intervals. Do not use more often than directed. Make sure that you are using your inhaler correctly. Ask your doctor or health care provider if you have any questions. Talk to your pediatrician regarding the use of this medicine in children. While this drug may be prescribed for children as young as 4 years for selected conditions, precautions do apply. Overdosage: If you think you have taken too much of this medicine contact a poison control center or emergency room at once. NOTE: This medicine is only for you. Do not share this medicine with others. What if I miss a dose? If you miss a dose, use it as soon as you can. If it is almost time for your next dose, use only that dose. Do not use double or extra doses. What may interact with this medicine? anti-infectives like chloroquine and pentamidine caffeine cisapride diuretics medicines for colds medicines for depression or for emotional or psychotic conditions medicines for weight loss including some herbal products methadone some antibiotics like clarithromycin, erythromycin, levofloxacin, and linezolid some heart medicines steroid hormones like dexamethasone, cortisone, hydrocortisone theophylline thyroid hormones This list may not describe all possible interactions. Give your health care provider a list of all the medicines, herbs, non-prescription drugs, or dietary supplements you use. Also tell them if you smoke, drink alcohol, or use illegal drugs. Some items may interact with your medicine. What should I watch for while using this medicine? Tell your doctor or health care professional if your symptoms do not improve. Do not use extra albuterol.  If your asthma or bronchitis gets worse while you are using this medicine, call your doctor right away. If your mouth gets dry try chewing sugarless gum or sucking hard candy. Drink water as  directed. What side effects may I notice from receiving this medicine? Side effects that you should report to your doctor or health care professional as soon as possible: allergic reactions like skin rash, itching or hives, swelling of the face, lips, or tongue breathing problems chest pain feeling faint or lightheaded, falls high blood pressure irregular heartbeat fever muscle cramps or weakness pain, tingling, numbness in the hands or feet vomiting Side effects that usually do not require medical attention (report to your doctor or health care professional if they continue or are bothersome): changes in taste cough dry mouth headache nervousness or trembling stomach upset stuffy or runny nose throat irritation trouble sleeping This list may not describe all possible side effects. Call your doctor for medical advice about side effects. You may report side effects to FDA at 1-800-FDA-1088. Where should I keep my medicine? Keep out of the reach of children. Store Proventil HFA and ProAir HFA at room temperature between 15 and 25 degrees C (59 and 77 degrees F). Store Ventolin HFA at room temperature between 20 and 25 degrees C (68 and 77 degrees F); it may be stored between 15 and 30 degrees C (59 and 86 degrees F) on occasion. The contents are under pressure and may burst when exposed to heat or flame. Do not freeze. This medicine does not work as well if it is too cold. Throw away the inhaler when the dose counter displays "0" or after the expiration date on the package, whichever comes first. Ventolin HFA should be thrown away 12 months after removing it from the foil pouch. NOTE: This sheet is a summary. It may not cover all possible information. If you have questions about this medicine, talk to your doctor, pharmacist, or health care provider.  2020 Elsevier/Gold Standard (2018-08-21 12:46:54) Chronic Obstructive Pulmonary Disease Exacerbation  Chronic obstructive pulmonary  disease (COPD) is a long-term (chronic) condition that affects the lungs. COPD is a general term that can be used to describe many different lung problems that cause lung swelling (inflammation) and limit airflow, including chronic bronchitis and emphysema. COPD exacerbations are episodes when breathing symptoms become much worse and require extra treatment. COPD exacerbations are usually caused by infections. Without treatment, COPD exacerbations can be severe and even life threatening. Frequent COPD exacerbations can cause further damage to the lungs. What are the causes? This condition may be caused by: Respiratory infections, including viral and bacterial infections. Exposure to smoke. Exposure to air pollution, chemical fumes, or dust. Things that give you an allergic reaction (allergens). Not taking your usual COPD medicines as directed. Underlying medical problems, such as congestive heart failure or infections not involving the lungs. In many cases, the cause (trigger) of this condition is not known. What increases the risk? The following factors may make you more likely to develop this condition: Smoking cigarettes. Old age. Frequent prior COPD exacerbations. What are the signs or symptoms? Symptoms of this condition include: Increased coughing. Increased production of mucus from your lungs (sputum). Increased wheezing. Increased shortness of breath. Rapid or labored breathing. Chest tightness. Less energy than usual. Sleep disruption from symptoms. Confusion or increased sleepiness. Often these symptoms happen or get worse even with the use of medicines. How is this diagnosed? This condition is diagnosed based on: Your medical history. A  physical exam. You may also have tests, including: A chest X-ray. Blood tests. Lung (pulmonary) function tests. How is this treated? Treatment for this condition depends on the severity and cause of the symptoms. You may need to be  admitted to a hospital for treatment. Some of the treatments commonly used to treat COPD exacerbations are: Antibiotic medicines. These may be used for severe exacerbations caused by a lung infection, such as pneumonia. Bronchodilators. These are inhaled medicines that expand the air passages and allow increased airflow. Steroid medicines. These act to reduce inflammation in the airways. They may be given with an inhaler, taken by mouth, or given through an IV tube inserted into one of your veins. Supplemental oxygen therapy. Airway clearing techniques, such as noninvasive ventilation (NIV) and positive expiratory pressure (PEP). These provide respiratory support through a mask or other noninvasive device. An example of this would be using a continuous positive airway pressure (CPAP) machine to improve delivery of oxygen into your lungs. Follow these instructions at home: Medicines Take over-the-counter and prescription medicines only as told by your health care provider. It is important to use correct technique with inhaled medicines. If you were prescribed an antibiotic medicine or oral steroid, take it as told by your health care provider. Do not stop taking the medicine even if you start to feel better. Lifestyle Eat a healthy diet. Exercise regularly. Get plenty of sleep. Avoid exposure to all substances that irritate the airway, especially to tobacco smoke. Wash your hands often with soap and water to reduce the risk of infection. If soap and water are not available, use hand sanitizer. During flu season, avoid enclosed spaces that are crowded with people. General instructions Drink enough fluid to keep your urine clear or pale yellow (unless you have a medical condition that requires fluid restriction). Use a cool mist vaporizer. This humidifies the air and makes it easier for you to clear your chest when you cough. If you have a home nebulizer and oxygen, continue to use them as told by  your health care provider. Keep all follow-up visits as told by your health care provider. This is important. How is this prevented? Stay up-to-date on pneumococcal and influenza (flu) vaccines. A flu shot is recommended every year to help prevent exacerbations. Do not use any products that contain nicotine or tobacco, such as cigarettes and e-cigarettes. Quitting smoking is very important in preventing COPD from getting worse and in preventing exacerbations from happening as often. If you need help quitting, ask your health care provider. Follow all instructions for pulmonary rehabilitation after a recent exacerbation. This can help prevent future exacerbations. Work with your health care provider to develop and follow an action plan. This tells you what steps to take when you experience certain symptoms. Contact a health care provider if: You have a worsening of your regular COPD symptoms. Get help right away if: You have worsening shortness of breath, even when resting. You have trouble talking. You have severe chest pain. You cough up blood. You have a fever. You have weakness, vomit repeatedly, or faint. You feel confused. You are not able to sleep because of your symptoms. You have trouble doing daily activities. Summary COPD exacerbations are episodes when breathing symptoms become much worse and require extra treatment above your normal treatment. Exacerbations can be severe and even life threatening. Frequent COPD exacerbations can cause further damage to your lungs. COPD exacerbations are usually triggered by infections such as the flu, colds, and even pneumonia.  Treatment for this condition depends on the severity and cause of the symptoms. You may need to be admitted to a hospital for treatment. Quitting smoking is very important to prevent COPD from getting worse and to prevent exacerbations from happening as often. This information is not intended to replace advice given to you  by your health care provider. Make sure you discuss any questions you have with your health care provider. Document Revised: 04/19/2017 Document Reviewed: 06/11/2016 Elsevier Patient Education  [place]. Shortness of Breath, Adult Shortness of breath means you have trouble breathing. Shortness of breath could be a sign of a medical problem. Follow these instructions at home:   Watch for any changes in your symptoms.  Do not use any products that contain nicotine or tobacco, such as cigarettes, e-cigarettes, and chewing tobacco.  Do not smoke. Smoking can cause shortness of breath. If you need help to quit smoking, ask your doctor.  Avoid things that can make it harder to breathe, such as: ? Mold. ? Dust. ? Air pollution. ? Chemical smells. ? Things that can cause allergy symptoms (allergens), if you have allergies.  Keep your living space clean. Use products that help remove mold and dust.  Rest as needed. Slowly return to your normal activities.  Take over-the-counter and prescription medicines only as told by your doctor. This includes oxygen therapy and inhaled medicines.  Keep all follow-up visits as told by your doctor. This is important. Contact a doctor if:  Your condition does not get better as soon as expected.  You have a hard time doing your normal activities, even after you rest.  You have new symptoms. Get help right away if:  Your shortness of breath gets worse.  You have trouble breathing when you are resting.  You feel light-headed or you pass out (faint).  You have a cough that is not helped by medicines.  You cough up blood.  You have pain with breathing.  You have pain in your chest, arms, shoulders, or belly (abdomen).  You have a fever.  You cannot walk up stairs.  You cannot exercise the way you normally do. These symptoms may represent a serious problem that is an emergency. Do not wait to see if the symptoms will go away. Get  medical help right away. Call your local emergency services (911 in the [place] Do not drive yourself to the hospital. Summary  Shortness of breath is when you have trouble breathing enough air. It can be a sign of a medical problem.  Avoid things that make it hard for you to breathe, such as smoking, pollution, mold, and dust.  Watch for any changes in your symptoms. Contact your doctor if you do not get better or you get worse. This information is not intended to replace advice given to you by your health care provider. Make sure you discuss any questions you have with your health care provider. Document Revised: 10/07/2017 Document Reviewed: 10/07/2017 Elsevier Patient Education  [place].  How to Perform a Sinus Rinse A sinus rinse is a home treatment that is used to rinse your sinuses with a sterile mixture of salt and water (saline solution). Sinuses are air-filled spaces in your skull behind the bones of your face and forehead that open into your nasal cavity. A sinus rinse can help to clear mucus, dirt, dust, or pollen from your nasal cavity. You may do a sinus rinse when you have a cold, a virus, nasal allergy symptoms,  a sinus infection, or stuffiness in your nose or sinuses. Talk with your health care provider about whether a sinus rinse might help you. What are the risks? A sinus rinse is generally safe and effective. However, there are a few risks, which include:  A burning sensation in your sinuses. This may happen if you do not make the saline solution as directed. Be sure to follow all directions when making the saline solution.  Nasal irritation.  Infection from contaminated water. This is rare, but possible. Do not do a sinus rinse if you have had ear or nasal surgery, ear infection, or blocked ears. Supplies needed:  Saline solution or powder.  Distilled or sterile water may be needed to mix with saline powder. ? You may use boiled and cooled tap water. Boil  tap water for 5 minutes; cool until it is lukewarm. Use within 24 hours. ? Do not use regular tap water to mix with the saline solution.  Neti pot or nasal rinse bottle. These supplies release the saline solution into your nose and through your sinuses. Neti pots and nasal rinse bottles can be purchased at [employer], a health food store, or online. How to perform a sinus rinse  1. Wash your hands with soap and water. 2. Wash your device according to the directions that came with the product and then dry it. 3. Use the solution that comes with your product or one that is sold separately in stores. Follow the mixing directions on the package if you need to mix with sterile or distilled water. 4. Fill the device with the amount of saline solution noted in the device instructions. 5. Stand over a sink and tilt your head sideways over the sink. 6. Place the spout of the device in your upper nostril (the one closer to the ceiling). 7. Gently pour or squeeze the saline solution into your nasal cavity. The liquid should drain out from the lower nostril if you are not too congested. 8. While rinsing, breathe through your open mouth. 9. Gently blow your nose to clear any mucus and rinse solution. Blowing too hard may cause ear pain. 10. Repeat in your other nostril. 11. Clean and rinse your device with clean water and then air-dry it. Talk with your health care provider or pharmacist if you have questions about how to do a sinus rinse. Summary  A sinus rinse is a home treatment that is used to rinse your sinuses with a sterile mixture of salt and water (saline solution).  A sinus rinse is generally safe and effective. Follow all instructions carefully.  Before doing a sinus rinse, talk with your health care provider about whether it would be helpful for you. This information is not intended to replace advice given to you by your health care provider. Make sure you discuss any questions you  have with your health care provider. Document Revised: 03/04/2017 Document Reviewed: 03/04/2017 Elsevier Patient Education  2020 [place].  Nonallergic Rhinitis Nonallergic rhinitis is a condition that causes symptoms that affect the nose, such as a runny nose and a stuffed-up nose (nasal congestion) that can make it hard to breathe through the nose. This condition is different from having an allergy (allergic rhinitis). Allergic rhinitis occurs when the body's defense system (immune system) reacts to a substance that you are allergic to (allergen), such as pollen, pet dander, mold, or dust. Nonallergic rhinitis has many similar symptoms, but it is not caused by allergens. Nonallergic rhinitis can be  a short-term or long-term problem. What are the causes? This condition can be caused by many different things. Some common types of nonallergic rhinitis include: Infectious rhinitis  This is usually due to an infection in the upper respiratory tract. Vasomotor rhinitis  This is the most common type of long-term nonallergic rhinitis.  It is caused by too much blood flow through the nose, which makes the tissue inside of the nose swell.  Symptoms are often triggered by strong odors, cold air, stress, drinking alcohol, cigarette smoke, or changes in the weather. Occupational rhinitis  This type is caused by triggers in the workplace, such as chemicals, dusts, animal dander, or air pollution. Hormonal rhinitis  This type occurs in women as a result of an increase in the male hormone estrogen.  It may occur during pregnancy, puberty, and menstrual cycles.  Symptoms improve when estrogen levels drop. Drug-induced rhinitis Several drugs can cause nonallergic rhinitis, including:  Medicines that are used to treat high blood pressure, heart disease, and Parkinson disease.  Aspirin and NSAIDs.  Over-the-counter nasal decongestant sprays. These can cause a type of nonallergic rhinitis  (rhinitis medicamentosa) when they are used for more than a few days. Nonallergic rhinitis with eosinophilia syndrome (NARES)  This type is caused by having too much of a certain type of white blood cell (eosinophil). Nonallergic rhinitis can also be caused by a reaction to eating hot or spicy foods. This does not usually cause long-term symptoms. In some cases, the cause of nonallergic rhinitis is not known. What increases the risk? You are more likely to develop this condition if:  You are 16-29 years of age.  You are a woman. Women are twice as likely to have this condition. What are the signs or symptoms? Common symptoms of this condition include:  Nasal congestion.  Runny nose.  The feeling of mucus going down the back of the throat (postnasal drip).  Trouble sleeping at night and daytime sleepiness. Less common symptoms include:  Sneezing.  Coughing.  Itchy nose.  Bloodshot eyes. How is this diagnosed? This condition may be diagnosed based on:  Your symptoms and medical history.  A physical exam.  Allergy testing to rule out allergic rhinitis. You may have skin tests or blood tests. In some cases, the health care provider may take a swab of nasal secretions to look for an increased number of eosinophils. This would be done to confirm a diagnosis of NARES. How is this treated? Treatment for this condition depends on the cause. No single treatment works for everyone. Work with your health care provider to find the best treatment for you. Treatment may include:  Avoiding the things that trigger your symptoms.  Using medicines to relieve congestion, such as: ? Steroid nasal spray. There are many types. You may need to try a few to find out which one works best. ? Decongestant medicine. This may be an oral medicine or a nasal spray. These medicines are only used for a short time.  Using medicines to relieve a runny nose. These may include antihistamine medicines or  anticholinergic nasal sprays.  Surgery to remove tissue from inside the nose may be needed in severe cases if the condition has not improved after 6-12 months of medical treatment. Follow these instructions at home:  Take or use over-the-counter and prescription medicines only as told by your health care provider. Do not stop using your medicine even if you start to feel better.  Use salt-water (saline) rinses or other solutions (  nasal washes or irrigations) to wash or rinse out the inside of your nose as told by your health care provider.  Do not take NSAIDs or medicines that contain aspirin if they make your symptoms worse.  Do not drink alcohol if it makes your symptoms worse.  Do not use any tobacco products, such as cigarettes, chewing tobacco, and e-cigarettes. If you need help quitting, ask your health care provider.  Avoid secondhand smoke.  Get some exercise every day. Exercise may help reduce symptoms of nonallergic rhinitis for some people. Ask your health care provider how much exercise and what types of exercise are safe for you.  Sleep with the head of your bed raised (elevated). This may reduce nighttime nasal congestion.  Keep all follow-up visits as told by your health care provider. This is important. Contact a health care provider if:  You have a fever.  Your symptoms are getting worse at home.  Your symptoms are not responding to medicine.  You develop new symptoms, especially a headache or nosebleed. This information is not intended to replace advice given to you by your health care provider. Make sure you discuss any questions you have with your health care provider. Document Revised: 04/19/2017 Document Reviewed: 07/28/2015 Elsevier Patient Education  [place]. Allergic Rhinitis, Adult Allergic rhinitis is an allergic reaction that affects the mucous membrane inside the nose. It causes sneezing, a runny or stuffy nose, and the feeling of mucus going  down the back of the throat (postnasal drip). Allergic rhinitis can be mild to severe. There are two types of allergic rhinitis:  Seasonal. This type is also called hay fever. It happens only during certain seasons.  Perennial. This type can happen at any time of the year. What are the causes? This condition happens when the body's defense system (immune system) responds to certain harmless substances called allergens as though they were germs.  Seasonal allergic rhinitis is triggered by pollen, which can come from grasses, trees, and weeds. Perennial allergic rhinitis may be caused by:  House dust mites.  Pet dander.  Mold spores. What are the signs or symptoms? Symptoms of this condition include:  Sneezing.  Runny or stuffy nose (nasal congestion).  Postnasal drip.  Itchy nose.  Tearing of the eyes.  Trouble sleeping.  Daytime sleepiness. How is this diagnosed? This condition may be diagnosed based on:  Your medical history.  A physical exam.  Tests to check for related conditions, such as: ? Asthma. ? Pink eye. ? Ear infection. ? Upper respiratory infection.  Tests to find out which allergens trigger your symptoms. These may include skin or blood tests. How is this treated? There is no cure for this condition, but treatment can help control symptoms. Treatment may include:  Taking medicines that block allergy symptoms, such as antihistamines. Medicine may be given as a shot, nasal spray, or pill.  Avoiding the allergen.  Desensitization. This treatment involves getting ongoing shots until your body becomes less sensitive to the allergen. This treatment may be done if other treatments do not help.  If taking medicine and avoiding the allergen does not work, new, stronger medicines may be prescribed. Follow these instructions at home:  Find out what you are allergic to. Common allergens include smoke, dust, and pollen.  Avoid the things you are allergic to.  These are some things you can do to help avoid allergens: ? Replace carpet with wood, tile, or vinyl flooring. Carpet can trap dander and dust. ?  Do not smoke. Do not allow smoking in your home. ? Change your heating and air conditioning filter at least once a month. ? During allergy season:  Keep windows closed as much as possible.  Plan outdoor activities when pollen counts are lowest. This is usually during the evening hours.  When coming indoors, change clothing and shower before sitting on furniture or bedding.  Take over-the-counter and prescription medicines only as told by your health care provider.  Keep all follow-up visits as told by your health care provider. This is important. Contact a health care provider if:  You have a fever.  You develop a persistent cough.  You make whistling sounds when you breathe (you wheeze).  Your symptoms interfere with your normal daily activities. Get help right away if:  You have shortness of breath. Summary  This condition can be managed by taking medicines as directed and avoiding allergens.  Contact your health care provider if you develop a persistent cough or fever.  During allergy season, keep windows closed as much as possible. This information is not intended to replace advice given to you by your health care provider. Make sure you discuss any questions you have with your health care provider. Document Revised: 04/19/2017 Document Reviewed: 06/14/2016 Elsevier Patient Education  [place].  Fatigue If you have fatigue, you feel tired all the time and have a lack of energy or a lack of motivation. Fatigue may make it difficult to start or complete tasks because of exhaustion. In general, occasional or mild fatigue is often a normal response to activity or life. However, long-lasting (chronic) or extreme fatigue may be a symptom of a medical condition. Follow these instructions at home: General instructions  Watch  your fatigue for any changes.  Go to bed and get up at the same time every day.  Avoid fatigue by pacing yourself during the day and getting enough sleep at night.  Maintain a healthy weight. Medicines  Take over-the-counter and prescription medicines only as told by your health care provider.  Take a multivitamin, if told by your health care provider.  Do not use herbal or dietary supplements unless they are approved by your health care provider. Activity   Exercise regularly, as told by your health care provider.  Use or practice techniques to help you relax, such as yoga, tai chi, meditation, or massage therapy. Eating and drinking   Avoid heavy meals in the evening.  Eat a well-balanced diet, which includes lean proteins, whole grains, plenty of fruits and vegetables, and low-fat dairy products.  Avoid consuming too much caffeine.  Avoid the use of alcohol.  Drink enough fluid to keep your urine pale yellow. Lifestyle  Change situations that cause you stress. Try to keep your work and personal schedule in balance.  Do not use any products that contain nicotine or tobacco, such as cigarettes and e-cigarettes. If you need help quitting, ask your health care provider.  Do not use drugs. Contact a health care provider if:  Your fatigue does not get better.  You have a fever.  You suddenly lose or gain weight.  You have headaches.  You have trouble falling asleep or sleeping through the night.  You feel angry, guilty, anxious, or sad.  You are unable to have a bowel movement (constipation).  Your skin is dry.  You have swelling in your legs or another part of your body. Get help right away if:  You feel confused.  Your vision is  blurry.  You feel faint or you pass out.  You have a severe headache.  You have severe pain in your abdomen, your back, or the area between your waist and hips (pelvis).  You have chest pain, shortness of breath, or an  irregular or fast heartbeat.  You are unable to urinate, or you urinate less than normal.  You have abnormal bleeding, such as bleeding from the rectum, vagina, nose, lungs, or nipples.  You vomit blood.  You have thoughts about hurting yourself or others. If you ever feel like you may hurt yourself or others, or have thoughts about taking your own life, get help right away. You can go to your nearest emergency department or call:  Your local emergency services (911 in the [place]  A suicide crisis helpline, such as the [facility] at (216) 436-5390. This is open 24 hours a day. Summary  If you have fatigue, you feel tired all the time and have a lack of energy or a lack of motivation.  Fatigue may make it difficult to start or complete tasks because of exhaustion.  Long-lasting (chronic) or extreme fatigue may be a symptom of a medical condition.  Exercise regularly, as told by your health care provider.  Change situations that cause you stress. Try to keep your work and personal schedule in balance. This information is not intended to replace advice given to you by your health care provider. Make sure you discuss any questions you have with your health care provider. Document Revised: 11/26/2018 Document Reviewed: 01/30/2017 Elsevier Patient Education  2020 [place].  COVID-19 COVID-19 is a respiratory infection that is caused by a virus called severe acute respiratory syndrome coronavirus 2 (SARS-CoV-2). The disease is also known as coronavirus disease or novel coronavirus. In some people, the virus may not cause any symptoms. In others, it may cause a serious infection. The infection can get worse quickly and can lead to complications, such as:  Pneumonia, or infection of the lungs.  Acute respiratory distress syndrome or ARDS. This is a condition in which fluid build-up in the lungs prevents the lungs from filling with air and passing oxygen into  the blood.  Acute respiratory failure. This is a condition in which there is not enough oxygen passing from the lungs to the body or when carbon dioxide is not passing from the lungs out of the body.  Sepsis or septic shock. This is a serious bodily reaction to an infection.  Blood clotting problems.  Secondary infections due to bacteria or fungus.  Organ failure. This is when your body's organs stop working. The virus that causes COVID-19 is contagious. This means that it can spread from person to person through droplets from coughs and sneezes (respiratory secretions). What are the causes? This illness is caused by a virus. You may catch the virus by:  Breathing in droplets from an infected person. Droplets can be spread by a person breathing, speaking, singing, coughing, or sneezing.  Touching something, like a table or a doorknob, that was exposed to the virus (contaminated) and then touching your mouth, nose, or eyes. What increases the risk? Risk for infection You are more likely to be infected with this virus if you:  Are within 6 feet (2 meters) of a person with COVID-19.  Provide care for or live with a person who is infected with COVID-19.  Spend time in crowded indoor spaces or live in shared housing. Risk for serious illness You are more likely  to become seriously ill from the virus if you:  Are 31 years of age or older. The higher your age, the more you are at risk for serious illness.  Live in a nursing home or long-term care facility.  Have cancer.  Have a long-term (chronic) disease such as: ? Chronic lung disease, including chronic obstructive pulmonary disease or asthma. ? A long-term disease that lowers your body's ability to fight infection (immunocompromised). ? Heart disease, including heart failure, a condition in which the arteries that lead to the heart become narrow or blocked (coronary artery disease), a disease which makes the heart muscle thick,  weak, or stiff (cardiomyopathy). ? Diabetes. ? Chronic kidney disease. ? Sickle cell disease, a condition in which red blood cells have an abnormal "sickle" shape. ? Liver disease.  Are obese. What are the signs or symptoms? Symptoms of this condition can range from mild to severe. Symptoms may appear any time from 2 to 14 days after being exposed to the virus. They include:  A fever or chills.  A cough.  Difficulty breathing.  Headaches, body aches, or muscle aches.  Runny or stuffy (congested) nose.  A sore throat.  New loss of taste or smell. Some people may also have stomach problems, such as nausea, vomiting, or diarrhea. Other people may not have any symptoms of COVID-19. How is this diagnosed? This condition may be diagnosed based on:  Your signs and symptoms, especially if: ? You live in an area with a COVID-19 outbreak. ? You recently traveled to or from an area where the virus is common. ? You provide care for or live with a person who was diagnosed with COVID-19. ? You were exposed to a person who was diagnosed with COVID-19.  A physical exam.  Lab tests, which may include: ? Taking a sample of fluid from the back of your nose and throat (nasopharyngeal fluid), your nose, or your throat using a swab. ? A sample of mucus from your lungs (sputum). ? Blood tests.  Imaging tests, which may include, X-rays, CT scan, or ultrasound. How is this treated? At present, there is no medicine to treat COVID-19. Medicines that treat other diseases are being used on a trial basis to see if they are effective against COVID-19. Your health care provider will talk with you about ways to treat your symptoms. For most people, the infection is mild and can be managed at home with rest, fluids, and over-the-counter medicines. Treatment for a serious infection usually takes places in a hospital intensive care unit (ICU). It may include one or more of the following treatments. These  treatments are given until your symptoms improve.  Receiving fluids and medicines through an IV.  Supplemental oxygen. Extra oxygen is given through a tube in the nose, a face mask, or a hood.  Positioning you to lie on your stomach (prone position). This makes it easier for oxygen to get into the lungs.  Continuous positive airway pressure (CPAP) or bi-level positive airway pressure (BPAP) machine. This treatment uses mild air pressure to keep the airways open. A tube that is connected to a motor delivers oxygen to the body.  Ventilator. This treatment moves air into and out of the lungs by using a tube that is placed in your windpipe.  Tracheostomy. This is a procedure to create a hole in the neck so that a breathing tube can be inserted.  Extracorporeal membrane oxygenation (ECMO). This procedure gives the lungs a chance to recover by  taking over the functions of the heart and lungs. It supplies oxygen to the body and removes carbon dioxide. Follow these instructions at home: Lifestyle  If you are sick, stay home except to get medical care. Your health care provider will tell you how long to stay home. Call your health care provider before you go for medical care.  Rest at home as told by your health care provider.  Do not use any products that contain nicotine or tobacco, such as cigarettes, e-cigarettes, and chewing tobacco. If you need help quitting, ask your health care provider.  Return to your normal activities as told by your health care provider. Ask your health care provider what activities are safe for you. General instructions  Take over-the-counter and prescription medicines only as told by your health care provider.  Drink enough fluid to keep your urine pale yellow.  Keep all follow-up visits as told by your health care provider. This is important. How is this prevented?  There is no vaccine to help prevent COVID-19 infection. However, there are steps you can take  to protect yourself and others from this virus. To protect yourself:   Do not travel to areas where COVID-19 is a risk. The areas where COVID-19 is reported change often. To identify high-risk areas and travel restrictions, check the CDC travel website: FatFares.com.br  If you live in, or must travel to, an area where COVID-19 is a risk, take precautions to avoid infection. ? Stay away from people who are sick. ? Wash your hands often with soap and water for 20 seconds. If soap and water are not available, use an alcohol-based hand sanitizer. ? Avoid touching your mouth, face, eyes, or nose. ? Avoid going out in public, follow guidance from your state and local health authorities. ? If you must go out in public, wear a cloth face covering or face mask. Make sure your mask covers your nose and mouth. ? Avoid crowded indoor spaces. Stay at least 6 feet (2 meters) away from others. ? Disinfect objects and surfaces that are frequently touched every day. This may include:  Counters and tables.  Doorknobs and light switches.  Sinks and faucets.  Electronics, such as phones, remote controls, keyboards, computers, and tablets. To protect others: If you have symptoms of COVID-19, take steps to prevent the virus from spreading to others.  If you think you have a COVID-19 infection, contact your health care provider right away. Tell your health care team that you think you may have a COVID-19 infection.  Stay home. Leave your house only to seek medical care. Do not use public transport.  Do not travel while you are sick.  Wash your hands often with soap and water for 20 seconds. If soap and water are not available, use alcohol-based hand sanitizer.  Stay away from other members of your household. Let healthy household members care for children and pets, if possible. If you have to care for children or pets, wash your hands often and wear a mask. If possible, stay in your own room,  separate from others. Use a different bathroom.  Make sure that all people in your household wash their hands well and often.  Cough or sneeze into a tissue or your sleeve or elbow. Do not cough or sneeze into your hand or into the air.  Wear a cloth face covering or face mask. Make sure your mask covers your nose and mouth. Where to find more information  Centers for Disease Control  and Prevention: PurpleGadgets.be  World Health Organization: https://www.castaneda.info/ Contact a health care provider if:  You live in or have traveled to an area where COVID-19 is a risk and you have symptoms of the infection.  You have had contact with someone who has COVID-19 and you have symptoms of the infection. Get help right away if:  You have trouble breathing.  You have pain or pressure in your chest.  You have confusion.  You have bluish lips and fingernails.  You have difficulty waking from sleep.  You have symptoms that get worse. These symptoms may represent a serious problem that is an emergency. Do not wait to see if the symptoms will go away. Get medical help right away. Call your local emergency services (911 in the [place] Do not drive yourself to the hospital. Let the emergency medical personnel know if you think you have COVID-19. Summary  COVID-19 is a respiratory infection that is caused by a virus. It is also known as coronavirus disease or novel coronavirus. It can cause serious infections, such as pneumonia, acute respiratory distress syndrome, acute respiratory failure, or sepsis.  The virus that causes COVID-19 is contagious. This means that it can spread from person to person through droplets from breathing, speaking, singing, coughing, or sneezing.  You are more likely to develop a serious illness if you are 74 years of age or older, have a weak immune system, live in a nursing home, or have chronic disease.  There is no medicine  to treat COVID-19. Your health care provider will talk with you about ways to treat your symptoms.  Take steps to protect yourself and others from infection. Wash your hands often and disinfect objects and surfaces that are frequently touched every day. Stay away from people who are sick and wear a mask if you are sick. This information is not intended to replace advice given to you by your health care provider. Make sure you discuss any questions you have with your health care provider. Document Revised: 03/06/2019 Document Reviewed: 06/12/2018 Elsevier Patient Education  [place].

## 2019-12-12 DIAGNOSIS — J9601 Acute respiratory failure with hypoxia: Secondary | ICD-10-CM | POA: Diagnosis not present

## 2019-12-12 LAB — EXPECTORATED SPUTUM ASSESSMENT W GRAM STAIN, RFLX TO RESP C

## 2019-12-12 LAB — CBC
HCT: 41.6 % (ref 39.0–52.0)
Hemoglobin: 14 g/dL (ref 13.0–17.0)
MCH: 30 pg (ref 26.0–34.0)
MCHC: 33.7 g/dL (ref 30.0–36.0)
MCV: 89.3 fL (ref 80.0–100.0)
Platelets: 334 10*3/uL (ref 150–400)
RBC: 4.66 MIL/uL (ref 4.22–5.81)
RDW: 13.6 % (ref 11.5–15.5)
WBC: 9.5 10*3/uL (ref 4.0–10.5)
nRBC: 0 % (ref 0.0–0.2)

## 2019-12-12 LAB — RESPIRATORY PANEL BY RT PCR (FLU A&B, COVID)
Influenza A by PCR: NEGATIVE
Influenza B by PCR: NEGATIVE
SARS Coronavirus 2 by RT PCR: NEGATIVE

## 2019-12-12 LAB — CRYPTOCOCCAL ANTIGEN: Crypto Ag: NEGATIVE

## 2019-12-12 LAB — HIV ANTIBODY (ROUTINE TESTING W REFLEX): HIV Screen 4th Generation wRfx: NONREACTIVE

## 2019-12-12 LAB — MAGNESIUM: Magnesium: 2.2 mg/dL (ref 1.7–2.4)

## 2019-12-12 MED ORDER — SODIUM CHLORIDE 0.9 % IV SOLN
INTRAVENOUS | Status: DC | PRN
  Administered 2019-12-12: 15:00:00 250 mL via INTRAVENOUS

## 2019-12-12 MED ORDER — METHYLPREDNISOLONE SODIUM SUCC 40 MG IJ SOLR
20.0000 mg | Freq: Two times a day (BID) | INTRAMUSCULAR | Status: DC
  Administered 2019-12-12 – 2019-12-13 (×2): 20 mg via INTRAVENOUS
  Filled 2019-12-12 (×3): qty 1

## 2019-12-12 MED ORDER — PNEUMOCOCCAL VAC POLYVALENT 25 MCG/0.5ML IJ INJ
0.5000 mL | INJECTION | INTRAMUSCULAR | Status: DC
  Filled 2019-12-12: qty 0.5

## 2019-12-12 MED ORDER — LEVOFLOXACIN 750 MG PO TABS
750.0000 mg | ORAL_TABLET | Freq: Every day | ORAL | Status: DC
  Administered 2019-12-12 – 2019-12-15 (×4): 750 mg via ORAL
  Filled 2019-12-12 (×4): qty 1

## 2019-12-12 NOTE — Progress Notes (Signed)
PROGRESS NOTE    Donald Sellers  XKG:[id number] DOB: Dec 22, 1950 DOA: 12/11/2019 PCP: Patient, No Pcp Per    Assessment & Plan:   Active Problems:   Acute respiratory failure with hypoxia (HCC)    Donald Sellers is a 69 y.o. Caucasian male with no significant medical history who presented with acute worsening of dyspnea.     # Acute hypoxic respiratory failure  --needed 6L on presentation.  COVID neg.   CTA neg for PE, but showed "Right-sided volume loss with subpleural architectural distortion/honeycombing. Extensive superimposed ground-glass pulmonary infiltrate" and "Superimposed consolidation within the left upper lobe."  --O2 requirement down to 4L today, pt reported breathing better. PLAN: --continue supplemental O2 to maintain sats >=92% --Treat the following conditions  # Acute exacerbation of Pulmonary fibrosis --New dx.  Per hx, likely slowly progressing and acutely worsened due to superimposed PNA PLAN: --continue supplemental O2 to maintain sats >=92% --Pulm consult rec   - obtain serology for ANA comprehensive, cryptococcal antigen, aspergillus ab, fungitell   - Respiratory viral panel   - sputum bacterial cultures   - procalcitonin trend   - MRSA PCR   - empiric low dose steroids -solumedrol 20 bid  # Left upper lobe PNA --presented with leukocytosis, but no fever and procal neg.  CTA showed consolidation. --started on ceftriaxone and azithro in the ED. PLAN: --de-escalate to Levaquin 750 mg daily, per pulm  # GERD --Per pulm GERD should be tightly controlled while in acute exacerbation of IPF --continue protonix 40 mg BID   DVT prophylaxis: Lovenox SQ Code Status: Full code  Family Communication:  Status is: inpatient Dispo:   The patient is from: home Anticipated d/c is to: home Anticipated d/c date is: unclear Patient currently is not medically stable to d/c due to: still on 4L supplemental O2, on solumedrol for acute exacerbation of  IPF.   Subjective and Interval History:  Pt reported feeling better, can take deeper breaths now.  Eating well.  No fever.   Objective: Vitals:   12/12/19 0533 12/12/19 0858 12/12/19 1250 12/12/19 1817  BP: (!) 120/87 117/75 114/68 (!) 109/58  Pulse: 102 102 (!) 108 101  Resp: (!) 28 20 20 20   Temp: 97.7 F (36.5 C) 98.1 F (36.7 C) (!) 97.5 F (36.4 C) 98.7 F (37.1 C)  TempSrc: Oral Oral Oral Oral  SpO2: 93% 96% 94% 96%  Weight:      Height:        Intake/Output Summary (Last 24 hours) at 12/12/2019 1932 Last data filed at 12/12/2019 1500 Gross per 24 hour  Intake 452.21 ml  Output 600 ml  Net -147.79 ml   Filed Weights   12/11/19 1335 12/11/19 2038  Weight: 81.6 kg 83.6 kg    Examination:   Constitutional: NAD, AAOx3 HEENT: conjunctivae and lids normal, EOMI CV: RRR tachycardic. Distal pulses +2.  No cyanosis.   RESP: crackles over posterior bases bilaterally, normal respiratory effort, on 4L GI: +BS, NTND Extremities: No effusions, edema, or tenderness in BLE SKIN: warm, dry and intact Neuro: II - XII grossly intact.  Sensation intact Psych: Normal mood and affect.  Appropriate judgement and reason   Data Reviewed: I have personally reviewed following labs and imaging studies  CBC: Recent Labs  Lab 12/11/19 1314 12/12/19 0503  WBC 18.0* 9.5  NEUTROABS 14.3*  --   HGB 14.8 14.0  HCT 45.3 41.6  MCV 89.3 89.3  PLT 354 334   Basic Metabolic Panel: Recent Labs  Lab  12/11/19 1314 12/12/19 0503  NA 135  --   K 4.0  --   CL 99  --   CO2 24  --   GLUCOSE 115*  --   BUN 10  --   CREATININE 0.85  --   CALCIUM 9.4  --   MG  --  2.2   GFR: Estimated Creatinine Clearance: 87.1 mL/min (by C-G formula based on SCr of 0.85 mg/dL). Liver Function Tests: Recent Labs  Lab 12/11/19 1314  AST 27  ALT 16  ALKPHOS 116  BILITOT 1.3*  PROT 8.9*  ALBUMIN 4.0   No results for input(s): LIPASE, AMYLASE in the last 168 hours. No results for input(s):  AMMONIA in the last 168 hours. Coagulation Profile: No results for input(s): INR, PROTIME in the last 168 hours. Cardiac Enzymes: No results for input(s): CKTOTAL, CKMB, CKMBINDEX, TROPONINI in the last 168 hours. BNP (last 3 results) No results for input(s): PROBNP in the last 8760 hours. HbA1C: No results for input(s): HGBA1C in the last 72 hours. CBG: No results for input(s): GLUCAP in the last 168 hours. Lipid Profile: Recent Labs    12/11/19 1620  TRIG 65   Thyroid Function Tests: No results for input(s): TSH, T4TOTAL, FREET4, T3FREE, THYROIDAB in the last 72 hours. Anemia Panel: Recent Labs    12/11/19 1620  FERRITIN 347*   Sepsis Labs: Recent Labs  Lab 12/11/19 1314  PROCALCITON <0.10  LATICACIDVEN 1.8    Recent Results (from the past 240 hour(s))  Blood Culture (routine x 2)     Status: None (Preliminary result)   Collection Time: 12/11/19  1:14 PM   Specimen: BLOOD  Result Value Ref Range Status   Specimen Description BLOOD RIGHT ANTECUBITAL  Final   Special Requests   Final    BOTTLES DRAWN AEROBIC AND ANAEROBIC Blood Culture adequate volume   Culture   Final    NO GROWTH < 24 HOURS Performed at [facility], [address] [place], [place] [id number]    Report Status PENDING  Incomplete  SARS Coronavirus 2 by RT PCR (hospital order, performed [facility]) Nasopharyngeal Nasopharyngeal Swab     Status: None   Collection Time: 12/11/19  2:02 PM   Specimen: Nasopharyngeal Swab  Result Value Ref Range Status   SARS Coronavirus 2 NEGATIVE NEGATIVE Final    Comment: (NOTE) SARS-CoV-2 target nucleic acids are NOT DETECTED.  The SARS-CoV-2 RNA is generally detectable in upper and lower respiratory specimens during the acute phase of infection. The lowest concentration of SARS-CoV-2 viral copies this assay can detect is 250 copies / mL. A negative result does not preclude SARS-CoV-2 infection and should not be used as the sole  basis for treatment or other patient management decisions.  A negative result may occur with improper specimen collection / handling, submission of specimen other than nasopharyngeal swab, presence of viral mutation(s) within the areas targeted by this assay, and inadequate number of viral copies (<250 copies / mL). A negative result must be combined with clinical observations, patient history, and epidemiological information.  Fact Sheet for Patients:   BoilerBrush.com.cyhttps://www.fda.gov/media/136312/download  Fact Sheet for Healthcare Providers: https://pope.com/https://www.fda.gov/media/136313/download  This test is not yet approved or  cleared by the [place] FDA and has been authorized for detection and/or diagnosis of SARS-CoV-2 by FDA under an Emergency Use Authorization (EUA).  This EUA will remain in effect (meaning this test can be used) for the duration of the COVID-19 declaration under Section  564(b)(1) of the Act, 21 U.S.C. section 360bbb-3(b)(1), unless the authorization is terminated or revoked sooner.  Performed at [facility], [address] [place], [place] [address]   Blood Culture (routine x 2)     Status: None (Preliminary result)   Collection Time: 12/11/19  2:03 PM   Specimen: BLOOD  Result Value Ref Range Status   Specimen Description BLOOD BLOOD LEFT FOREARM  Final   Special Requests   Final    BOTTLES DRAWN AEROBIC AND ANAEROBIC Blood Culture adequate volume   Culture   Final    NO GROWTH < 24 HOURS Performed at [facility], [address] [place], [place] [address]    Report Status PENDING  Incomplete  Expectorated sputum assessment w rflx to resp cult     Status: None   Collection Time: 12/11/19  8:50 PM   Specimen: Urine, Clean Catch; Sputum  Result Value Ref Range Status   Specimen Description Expect. Sput  Final   Special Requests NONE  Final   Sputum evaluation   Final    THIS SPECIMEN IS ACCEPTABLE FOR SPUTUM CULTURE Performed at [facility], [address] [place], [place] [address]    Report Status 12/12/2019 FINAL  Final  Culture, respiratory     Status: None (Preliminary result)   Collection Time: 12/11/19  8:50 PM  Result Value Ref Range Status   Specimen Description   Final    Expect. Sput Performed at [facility], [address] [place], [place] [address]    Special Requests   Final    NONE Reflexed from [id number] Performed at [facility], [address] [place], [place] [address]    Gram Stain   Final    MODERATE WBC PRESENT, PREDOMINANTLY PMN RARE SQUAMOUS EPITHELIAL CELLS PRESENT FEW GRAM POSITIVE COCCI IN PAIRS RARE GRAM POSITIVE RODS RARE GRAM NEGATIVE RODS Performed at [facility], [address] [place], [place] [address]    Culture PENDING  Incomplete   Report Status PENDING  Incomplete  Expectorated sputum assessment w rflx to resp cult     Status: None (Preliminary result)   Collection Time: 12/11/19  8:52 PM   Specimen: Sputum  Result Value Ref Range Status   Specimen Description SPUTUM  Final   Special Requests NONE  Final   Sputum evaluation   Final    THIS SPECIMEN IS ACCEPTABLE FOR SPUTUM CULTURE Performed at [facility], [address], [place], [place] [address]    Report Status PENDING  Incomplete  Respiratory Panel by RT PCR (Flu A&B, Covid) - Nasopharyngeal Swab     Status: None   Collection Time: 12/12/19 10:55 AM   Specimen: Nasopharyngeal Swab  Result Value Ref Range Status   SARS Coronavirus 2 by RT PCR NEGATIVE NEGATIVE Final    Comment: (NOTE) SARS-CoV-2 target nucleic acids are NOT DETECTED.  The SARS-CoV-2 RNA is generally detectable in upper respiratoy specimens during the acute phase of infection. The lowest concentration of SARS-CoV-2 viral copies this assay can detect is 131 copies/mL. A negative result does not preclude SARS-Cov-2 infection and should not be used as the sole basis for treatment or other patient  management decisions. A negative result may occur with  improper specimen collection/handling, submission of specimen other than nasopharyngeal swab, presence of viral mutation(s) within the areas targeted by this assay, and inadequate number of viral copies (<131 copies/mL). A negative result must be combined with clinical observations, patient  history, and epidemiological information. The expected result is Negative.  Fact Sheet for Patients:  https://www.moore.com/  Fact Sheet for Healthcare Providers:  https://www.young.biz/  This test is no t yet approved or cleared by the [place] FDA and  has been authorized for detection and/or diagnosis of SARS-CoV-2 by FDA under an Emergency Use Authorization (EUA). This EUA will remain  in effect (meaning this test can be used) for the duration of the COVID-19 declaration under Section 564(b)(1) of the Act, 21 U.S.C. section 360bbb-3(b)(1), unless the authorization is terminated or revoked sooner.     Influenza A by PCR NEGATIVE NEGATIVE Final   Influenza B by PCR NEGATIVE NEGATIVE Final    Comment: (NOTE) The Xpert Xpress SARS-CoV-2/FLU/RSV assay is intended as an aid in  the diagnosis of influenza from Nasopharyngeal swab specimens and  should not be used as a sole basis for treatment. Nasal washings and  aspirates are unacceptable for Xpert Xpress SARS-CoV-2/FLU/RSV  testing.  Fact Sheet for Patients: https://www.moore.com/  Fact Sheet for Healthcare Providers: https://www.young.biz/  This test is not yet approved or cleared by the [place] FDA and  has been authorized for detection and/or diagnosis of SARS-CoV-2 by  FDA under an Emergency Use Authorization (EUA). This EUA will remain  in effect (meaning this test can be used) for the duration of the  Covid-19 declaration under Section 564(b)(1) of the Act, 21  U.S.C. section 360bbb-3(b)(1),  unless the authorization is  terminated or revoked. Performed at [facility], [address] [place], [place] [address]       Radiology Studies: CT Angio Chest PE W and/or Wo Contrast  Result Date: 12/11/2019 CLINICAL DATA:  Dyspnea EXAM: CT ANGIOGRAPHY CHEST WITH CONTRAST TECHNIQUE: Multidetector CT imaging of the chest was performed using the standard protocol during bolus administration of intravenous contrast. Multiplanar CT image reconstructions and MIPs were obtained to evaluate the vascular anatomy. CONTRAST:  69mL OMNIPAQUE IOHEXOL 350 MG/ML SOLN COMPARISON:  None. FINDINGS: Cardiovascular: Satisfactory opacification of the pulmonary arteries to the segmental level. No evidence of pulmonary embolism. The central pulmonary arteries are enlarged in keeping with changes of pulmonary arterial hypertension. Moderate coronary artery calcification primarily within the left anterior descending coronary artery. Global cardiac size within normal limits. No pericardial effusion. Thoracic aorta is unremarkable. Mediastinum/Nodes: There is extensive shotty mediastinal and bilateral hilar adenopathy, likely reactive in nature. No frankly pathologic mediastinal adenopathy. Visualized thyroid is unremarkable. Esophagus is unremarkable. Lungs/Pleura: There is asymmetric right-sided volume loss, similar to that noted on prior chest radiograph. There is peripheral, largely subpleural architectural distortion and honeycombing as well as traction bronchiectasis, best noted within the right upper lobe and lower lobes bilaterally. There is extensive asymmetric ground-glass pulmonary infiltrate throughout the right lung though small amount is also identified within the left lung base. The degree of architectural distortion is relatively minor. The findings are suggestive of for a broad acute interstitial lung disease and though nonspecific interstitial pneumonia (NSIP) is favored, the differential would  include UIP. This is not optimally characterized on this examination. There is, however, more superimposed consolidation within the left upper lobe which appears distinct from the more diffuse pulmonary process and likely represents acute lobar pneumonia in the appropriate clinical setting. No pneumothorax or pleural effusion. No central obstructing mass lesion. Upper Abdomen: Unremarkable Musculoskeletal: No acute bone abnormality. Review of the MIP images confirms the above findings. IMPRESSION: No pulmonary embolism. Right-sided volume loss with subpleural architectural distortion/honeycombing. Extensive superimposed ground-glass pulmonary infiltrate. 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 Follow-up high-resolution CT examination of the chest is recommended in 3 months, after appropriate antibiotic therapy for the patient's underlying acute pneumonia. Pulmonary consultation would be helpful for further management. Superimposed consolidation within the left upper lobe most in keeping with acute lobar pneumonia. Moderate coronary artery calcification. Electronically Signed   By: Helyn Numbers MD   On: 12/11/2019 15:37   DG Chest Portable 1 View  Result Date: 12/11/2019 CLINICAL DATA:  Shortness of breath EXAM: PORTABLE CHEST 1 VIEW COMPARISON:  None. FINDINGS: Heart size is within normal limits. Low lung volumes. Extensive bilateral interstitial opacities throughout both lungs. No large pleural fluid collection. No pneumothorax. IMPRESSION: Extensive bilateral interstitial opacities throughout both lungs. Findings may represent pulmonary edema versus multifocal atypical/viral infection. Electronically Signed   By: Duanne Guess D.O.   On: 12/11/2019 14:22     Scheduled Meds: . enoxaparin (LOVENOX) injection  40 mg Subcutaneous Q24H  . levofloxacin  750 mg Oral Daily  . methylPREDNISolone (SOLU-MEDROL) injection  20 mg  Intravenous BID  . pantoprazole  40 mg Oral BID  . [START ON 12/13/2019] pneumococcal 23 valent vaccine  0.5 mL Intramuscular Tomorrow-1000   Continuous Infusions: . sodium chloride Stopped (12/12/19 1432)     LOS: 1 day     Darlin Priestly, MD [facility] If 7PM-7AM, please contact night-coverage 12/12/2019, 7:32 PM

## 2019-12-12 NOTE — Progress Notes (Signed)
Pt's MEWS remains in yellow due to tachypnea and tachycardia.  Pt has sob on exertion but has rested well during most of shift with RR even and unlabored.  HR has lowered for 110's to low 100's at this time.

## 2019-12-12 NOTE — Progress Notes (Signed)
Pulmonary Medicine          Date: 12/12/2019,   MRN# [id number] Donald Sellers 1951-01-23     AdmissionWeight: 81.6 kg                 CurrentWeight: 83.6 kg  Referring physician: Dr Fran Lowes    CHIEF COMPLAINT:   Acute hypoxemic respiratory failure   SUBJECTIVE   Patient is feeling better this am , he is on 4L/min [place]. He is tachycardic and does have labored breathing still. Plan to continue tx for CAP and low dose steroids for AEIPF.     Care plan and prognosis reviewed with patient.    PAST MEDICAL HISTORY   Past Medical History:  Diagnosis Date  . Patient denies medical problems      SURGICAL HISTORY   Past Surgical History:  Procedure Laterality Date  . EYE SURGERY       FAMILY HISTORY   Family History  Problem Relation Age of Onset  . Pulmonary fibrosis Neg Hx      SOCIAL HISTORY   Social History   Tobacco Use  . Smoking status: Former Smoker    Packs/day: 1.00    Years: 11.00    Pack years: 11.00    Types: Cigarettes    Quit date: 1980    Years since quitting: 41.5  . Smokeless tobacco: Never Used  Substance Use Topics  . Alcohol use: Yes  . Drug use: Never     MEDICATIONS    Home Medication:    Current Medication:  Current Facility-Administered Medications:  .  0.9 %  sodium chloride infusion, , Intravenous, PRN, Darlin Priestly, MD .  acetaminophen (TYLENOL) tablet 1,000 mg, 1,000 mg, Oral, Q8H PRN, Darlin Priestly, MD .  alum & mag hydroxide-simeth (MAALOX/MYLANTA) 200-200-20 MG/5ML suspension 15 mL, 15 mL, Oral, Q6H PRN, Darlin Priestly, MD .  azithromycin (ZITHROMAX) 500 mg in sodium chloride 0.9 % 250 mL IVPB, 500 mg, Intravenous, Q24H, Darlin Priestly, MD, Stopped at 12/11/19 1610 .  calcium carbonate (TUMS - dosed in mg elemental calcium) chewable tablet 200 mg of elemental calcium, 1 tablet, Oral, TID PRN, Darlin Priestly, MD .  cefTRIAXone (ROCEPHIN) 2 g in sodium chloride 0.9 % 100 mL IVPB, 2 g, Intravenous, Q24H, Darlin Priestly, MD, Stopped at  12/11/19 1527 .  docusate sodium (COLACE) capsule 100 mg, 100 mg, Oral, BID PRN, Darlin Priestly, MD .  enoxaparin (LOVENOX) injection 40 mg, 40 mg, Subcutaneous, Q24H, Darlin Priestly, MD, 40 mg at 12/11/19 2225 .  guaiFENesin-dextromethorphan (ROBITUSSIN DM) 100-10 MG/5ML syrup 10 mL, 10 mL, Oral, Q6H PRN, Darlin Priestly, MD .  ondansetron [facility]) injection 4 mg, 4 mg, Intravenous, Q6H PRN, Darlin Priestly, MD .  ondansetron (ZOFRAN-ODT) disintegrating tablet 4 mg, 4 mg, Oral, Q8H PRN, Darlin Priestly, MD .  pantoprazole (PROTONIX) EC tablet 40 mg, 40 mg, Oral, BID, Vida Rigger, MD, 40 mg at 12/12/19 1039 .  [START ON 12/13/2019] pneumococcal 23 valent vaccine (PNEUMOVAX-23) injection 0.5 mL, 0.5 mL, Intramuscular, Tomorrow-1000, Amin, Sumayya, MD .  polyethylene glycol (MIRALAX / GLYCOLAX) packet 17 g, 17 g, Oral, BID PRN, Darlin Priestly, MD    ALLERGIES   Patient has no known allergies.     REVIEW OF SYSTEMS    Review of Systems:  Gen:  Denies  fever, sweats, chills weigh loss  HEENT: Denies blurred vision, double vision, ear pain, eye pain, hearing loss, nose bleeds, sore throat Cardiac:  No dizziness, chest pain or heaviness,  chest tightness,edema Resp:   Denies cough or sputum porduction, shortness of breath,wheezing, hemoptysis,  Gi: Denies swallowing difficulty, stomach pain, nausea or vomiting, diarrhea, constipation, bowel incontinence Gu:  Denies bladder incontinence, burning urine Ext:   Denies Joint pain, stiffness or swelling Skin: Denies  skin rash, easy bruising or bleeding or hives Endoc:  Denies polyuria, polydipsia , polyphagia or weight change Psych:   Denies depression, insomnia or hallucinations   Other:  All other systems negative   VS: BP 114/68 (BP Location: Left Arm)   Pulse (!) 108   Temp (!) 97.5 F (36.4 C) (Oral)   Resp 20   Ht 5\' 10"  (1.778 m)   Wt 83.6 kg   SpO2 94%   BMI 26.43 kg/m      PHYSICAL EXAM    GENERAL:NAD, no fevers, chills, no weakness no  fatigue HEAD: Normocephalic, atraumatic.  EYES: Pupils equal, round, reactive to light. Extraocular muscles intact. No scleral icterus.  MOUTH: Moist mucosal membrane. Dentition intact. No abscess noted.  EAR, NOSE, THROAT: Clear without exudates. No external lesions.  NECK: Supple. No thyromegaly. No nodules. No JVD.  PULMONARY: Diffuse coarse rhonchi right sided +wheezes CARDIOVASCULAR: S1 and S2. Regular rate and rhythm. No murmurs, rubs, or gallops. No edema. Pedal pulses 2+ bilaterally.  GASTROINTESTINAL: Soft, nontender, nondistended. No masses. Positive bowel sounds. No hepatosplenomegaly.  MUSCULOSKELETAL: No swelling, clubbing, or edema. Range of motion full in all extremities.  NEUROLOGIC: Cranial nerves II through XII are intact. No gross focal neurological deficits. Sensation intact. Reflexes intact.  SKIN: No ulceration, lesions, rashes, or cyanosis. Skin warm and dry. Turgor intact.  PSYCHIATRIC: Mood, affect within normal limits. The patient is awake, alert and oriented x 3. Insight, judgment intact.       IMAGING    CT Angio Chest PE W and/or Wo Contrast  Result Date: 12/11/2019 CLINICAL DATA:  Dyspnea EXAM: CT ANGIOGRAPHY CHEST WITH CONTRAST TECHNIQUE: Multidetector CT imaging of the chest was performed using the standard protocol during bolus administration of intravenous contrast. Multiplanar CT image reconstructions and MIPs were obtained to evaluate the vascular anatomy. CONTRAST:  61mL OMNIPAQUE IOHEXOL 350 MG/ML SOLN COMPARISON:  None. FINDINGS: Cardiovascular: Satisfactory opacification of the pulmonary arteries to the segmental level. No evidence of pulmonary embolism. The central pulmonary arteries are enlarged in keeping with changes of pulmonary arterial hypertension. Moderate coronary artery calcification primarily within the left anterior descending coronary artery. Global cardiac size within normal limits. No pericardial effusion. Thoracic aorta is unremarkable.  Mediastinum/Nodes: There is extensive shotty mediastinal and bilateral hilar adenopathy, likely reactive in nature. No frankly pathologic mediastinal adenopathy. Visualized thyroid is unremarkable. Esophagus is unremarkable. Lungs/Pleura: There is asymmetric right-sided volume loss, similar to that noted on prior chest radiograph. There is peripheral, largely subpleural architectural distortion and honeycombing as well as traction bronchiectasis, best noted within the right upper lobe and lower lobes bilaterally. There is extensive asymmetric ground-glass pulmonary infiltrate throughout the right lung though small amount is also identified within the left lung base. The degree of architectural distortion is relatively minor. The findings are suggestive of for a broad acute interstitial lung disease and though nonspecific interstitial pneumonia (NSIP) is favored, the differential would include UIP. This is not optimally characterized on this examination. There is, however, more superimposed consolidation within the left upper lobe which appears distinct from the more diffuse pulmonary process and likely represents acute lobar pneumonia in the appropriate clinical setting. No pneumothorax or pleural effusion. No central obstructing mass lesion.  Upper Abdomen: Unremarkable Musculoskeletal: No acute bone abnormality. Review of the MIP images confirms the above findings. IMPRESSION: No pulmonary embolism. Right-sided volume loss with subpleural architectural distortion/honeycombing. Extensive superimposed ground-glass pulmonary infiltrate. 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 Follow-up high-resolution CT examination of the chest is recommended in 3 months, after appropriate antibiotic therapy for the patient's underlying acute pneumonia. Pulmonary consultation would be helpful for further management. Superimposed consolidation within the left  upper lobe most in keeping with acute lobar pneumonia. Moderate coronary artery calcification. Electronically Signed   By: Helyn Numbers MD   On: 12/11/2019 15:37   DG Chest Portable 1 View  Result Date: 12/11/2019 CLINICAL DATA:  Shortness of breath EXAM: PORTABLE CHEST 1 VIEW COMPARISON:  None. FINDINGS: Heart size is within normal limits. Low lung volumes. Extensive bilateral interstitial opacities throughout both lungs. No large pleural fluid collection. No pneumothorax. IMPRESSION: Extensive bilateral interstitial opacities throughout both lungs. Findings may represent pulmonary edema versus multifocal atypical/viral infection. Electronically Signed   By: Duanne Guess D.O.   On: 12/11/2019 14:22      ASSESSMENT/PLAN   Acute exacerbation of Idiopathic pulmonary fibosis   - there is left upper lobe consolidation suggestive of acute infectious cause of current exacerbation   - will obtain serology for ANA comprehensive, cryptococcal antigen, aspergillus ab, fungitell   - Respiratory viral panel -not done yet   - sputum bacterial cultures   - procalcitonin trend   - MRSA PCR   - empiric low dose steroids -solumedrol 20 bid   -Rocephin /Zothromax >>Levoquin 750 po daily    GERD   -this should be very tightly controlled while in acute exacerbation of IPF - will start protonix 40 bid     Thank you for allowing me to participate in the care of this patient.   Patient/Family are satisfied with care plan and all questions have been answered.  This document was prepared using Dragon voice recognition software and may include unintentional dictation errors.     Vida Rigger, M.D.  Division of Pulmonary & Critical Care Medicine  [facility]

## 2019-12-13 ENCOUNTER — Inpatient Hospital Stay: Admit: 2019-12-13 | Payer: BC Managed Care – PPO

## 2019-12-13 DIAGNOSIS — J9601 Acute respiratory failure with hypoxia: Secondary | ICD-10-CM | POA: Diagnosis not present

## 2019-12-13 LAB — CBC
HCT: 40.9 % (ref 39.0–52.0)
Hemoglobin: 13.7 g/dL (ref 13.0–17.0)
MCH: 29.8 pg (ref 26.0–34.0)
MCHC: 33.5 g/dL (ref 30.0–36.0)
MCV: 88.9 fL (ref 80.0–100.0)
Platelets: 366 10*3/uL (ref 150–400)
RBC: 4.6 MIL/uL (ref 4.22–5.81)
RDW: 13.6 % (ref 11.5–15.5)
WBC: 18 10*3/uL — ABNORMAL HIGH (ref 4.0–10.5)
nRBC: 0 % (ref 0.0–0.2)

## 2019-12-13 LAB — MAGNESIUM: Magnesium: 2.3 mg/dL (ref 1.7–2.4)

## 2019-12-13 LAB — BASIC METABOLIC PANEL
Anion gap: 10 (ref 5–15)
BUN: 22 mg/dL (ref 8–23)
CO2: 26 mmol/L (ref 22–32)
Calcium: 9.3 mg/dL (ref 8.9–10.3)
Chloride: 100 mmol/L (ref 98–111)
Creatinine, Ser: 0.77 mg/dL (ref 0.61–1.24)
GFR calc Af Amer: >60 mL/min (ref >60)
GFR calc non Af Amer: >60 mL/min (ref >60)
Glucose, Bld: 123 mg/dL — ABNORMAL HIGH (ref 70–99)
Potassium: 5.5 mmol/L — ABNORMAL HIGH (ref 3.5–5.1)
Sodium: 136 mmol/L (ref 135–145)

## 2019-12-13 LAB — URINE CULTURE: Culture: NO GROWTH

## 2019-12-13 LAB — PROCALCITONIN: Procalcitonin: <0.1 ng/mL

## 2019-12-13 MED ORDER — ALPRAZOLAM 0.5 MG PO TABS
0.5000 mg | ORAL_TABLET | Freq: Every day | ORAL | Status: DC
  Administered 2019-12-13 – 2019-12-16 (×4): 0.5 mg via ORAL
  Filled 2019-12-13 (×4): qty 1

## 2019-12-13 MED ORDER — MAGNESIUM HYDROXIDE 400 MG/5ML PO SUSP
30.0000 mL | Freq: Every day | ORAL | Status: DC
  Administered 2019-12-15: 30 mL via ORAL
  Filled 2019-12-13 (×5): qty 30

## 2019-12-13 MED ORDER — FUROSEMIDE 10 MG/ML IJ SOLN
20.0000 mg | Freq: Every day | INTRAMUSCULAR | Status: DC
  Administered 2019-12-13 – 2019-12-17 (×5): 20 mg via INTRAVENOUS
  Filled 2019-12-13 (×5): qty 2

## 2019-12-13 MED ORDER — FUROSEMIDE 10 MG/ML IJ SOLN: 40.0000 mg | Freq: Every day | INTRAMUSCULAR | Status: DC

## 2019-12-13 MED ORDER — METOPROLOL TARTRATE 25 MG PO TABS
12.5000 mg | ORAL_TABLET | Freq: Two times a day (BID) | ORAL | Status: DC
  Administered 2019-12-13 – 2019-12-17 (×8): 12.5 mg via ORAL
  Filled 2019-12-13 (×8): qty 1

## 2019-12-13 MED ORDER — PREDNISONE 20 MG PO TABS
40.0000 mg | ORAL_TABLET | Freq: Every day | ORAL | Status: DC
  Administered 2019-12-14 – 2019-12-17 (×4): 40 mg via ORAL
  Filled 2019-12-13 (×4): qty 2

## 2019-12-13 NOTE — Progress Notes (Signed)
PROGRESS NOTE    Donald Sellers  HOZ:[id number] DOB: June 02, 1950 DOA: 12/11/2019 PCP: Patient, No Pcp Per    Assessment & Plan:   Active Problems:   Acute respiratory failure with hypoxia (HCC)    Donald Sellers is a 69 y.o. Caucasian male with no significant medical history who presented with acute worsening of dyspnea.     # Acute hypoxic respiratory failure  --needed 6L on presentation.  COVID neg.   CTA neg for PE, but showed "Right-sided volume loss with subpleural architectural distortion/honeycombing. Extensive superimposed ground-glass pulmonary infiltrate" and "Superimposed consolidation within the left upper lobe."  --O2 requirement down to 4L on 7/24, pt reported breathing better. PLAN: --continue supplemental O2 to maintain sats >=92%, wean as able --Treat the following conditions  # Acute exacerbation of Pulmonary fibrosis --New dx.  Per hx, likely slowly progressing and acutely worsened due to superimposed PNA --procal neg x2 --cryptococcal antigen neg PLAN: --continue supplemental O2 to maintain sats >=92%, wean as able --Pulm consult rec   - serology for ANA comprehensive, aspergillus ab, fungitell pending   - Respiratory viral panel   - sputum bacterial cultures pending   - MRSA PCR   - continue solumedrol 20 mg BID  # Left upper lobe PNA --presented with leukocytosis, but no fever and procal neg.  CTA showed consolidation. --started on ceftriaxone and azithro in the ED, and switched to Levaquin on 7/24 PLAN: --continue Levaquin   # GERD --Per pulm GERD should be tightly controlled while in acute exacerbation of IPF --continue protonix 40 mg BID   DVT prophylaxis: Lovenox SQ Code Status: Full code  Family Communication:  Status is: inpatient Dispo:   The patient is from: home Anticipated d/c is to: home Anticipated d/c date is: unclear Patient currently is not medically stable to d/c due to: still on 4L supplemental O2, on solumedrol for acute  exacerbation of IPF.   Subjective and Interval History:  Pt continued to feel better, better breath and improved appetite.  Was up grooming himself today.   Objective: Vitals:   12/13/19 0005 12/13/19 0521 12/13/19 0959 12/13/19 1238  BP: 109/74 (!) 109/95 109/65 117/74  Pulse: 79 94 97 94  Resp: 16 16 16 20   Temp: 98.1 F (36.7 C) (!) 97.3 F (36.3 C) 97.7 F (36.5 C) (!) 97.5 F (36.4 C)  TempSrc: Oral Oral Oral Oral  SpO2: 96% 96% 97% 98%  Weight:      Height:        Intake/Output Summary (Last 24 hours) at 12/13/2019 1326 Last data filed at 12/13/2019 1000 Gross per 24 hour  Intake 452.21 ml  Output --  Net 452.21 ml   Filed Weights   12/11/19 1335 12/11/19 2038  Weight: 81.6 kg 83.6 kg    Examination:   Constitutional: NAD, AAOx3 HEENT: conjunctivae and lids normal, EOMI CV: RRR no M,R,G. Distal pulses +2.  No cyanosis.   RESP: CTA B/L, normal respiratory effort, on 4L GI: +BS, NTND Extremities: No effusions, edema, or tenderness in BLE SKIN: warm, dry and intact Neuro: II - XII grossly intact.  Sensation intact Psych: Normal mood and affect.  Appropriate judgement and reason    Data Reviewed: I have personally reviewed following labs and imaging studies  CBC: Recent Labs  Lab 12/11/19 1314 12/12/19 0503 12/13/19 0620  WBC 18.0* 9.5 18.0*  NEUTROABS 14.3*  --   --   HGB 14.8 14.0 13.7  HCT 45.3 41.6 40.9  MCV 89.3 89.3 88.9  PLT 354 334 366   Basic Metabolic Panel: Recent Labs  Lab 12/11/19 1314 12/12/19 0503 12/13/19 0620  NA 135  --  136  K 4.0  --  5.5*  CL 99  --  100  CO2 24  --  26  GLUCOSE 115*  --  123*  BUN 10  --  22  CREATININE 0.85  --  0.77  CALCIUM 9.4  --  9.3  MG  --  2.2 2.3   GFR: Estimated Creatinine Donald: 92.5 mL/min (by C-G formula based on SCr of 0.77 mg/dL). Liver Function Tests: Recent Labs  Lab 12/11/19 1314  AST 27  ALT 16  ALKPHOS 116  BILITOT 1.3*  PROT 8.9*  ALBUMIN 4.0   No results for  input(s): LIPASE, AMYLASE in the last 168 hours. No results for input(s): AMMONIA in the last 168 hours. Coagulation Profile: No results for input(s): INR, PROTIME in the last 168 hours. Cardiac Enzymes: No results for input(s): CKTOTAL, CKMB, CKMBINDEX, TROPONINI in the last 168 hours. BNP (last 3 results) No results for input(s): PROBNP in the last 8760 hours. HbA1C: No results for input(s): HGBA1C in the last 72 hours. CBG: No results for input(s): GLUCAP in the last 168 hours. Lipid Profile: Recent Labs    12/11/19 1620  TRIG 65   Thyroid Function Tests: No results for input(s): TSH, T4TOTAL, FREET4, T3FREE, THYROIDAB in the last 72 hours. Anemia Panel: Recent Labs    12/11/19 1620  FERRITIN 347*   Sepsis Labs: Recent Labs  Lab 12/11/19 1314 12/13/19 0620  PROCALCITON <0.10 <0.10  LATICACIDVEN 1.8  --     Recent Results (from the past 240 hour(s))  Blood Culture (routine x 2)     Status: None (Preliminary result)   Collection Time: 12/11/19  1:14 PM   Specimen: BLOOD  Result Value Ref Range Status   Specimen Description BLOOD RIGHT ANTECUBITAL  Final   Special Requests   Final    BOTTLES DRAWN AEROBIC AND ANAEROBIC Blood Culture adequate volume   Culture   Final    NO GROWTH 2 DAYS Performed at [facility], [address], [place], [place] [address]    Report Status PENDING  Incomplete  SARS Coronavirus 2 by RT PCR (hospital order, performed in [facility] hospital lab) Nasopharyngeal Nasopharyngeal Swab     Status: None   Collection Time: 12/11/19  2:02 PM   Specimen: Nasopharyngeal Swab  Result Value Ref Range Status   SARS Coronavirus 2 NEGATIVE NEGATIVE Final    Comment: (NOTE) SARS-CoV-2 target nucleic acids are NOT DETECTED.  The SARS-CoV-2 RNA is generally detectable in upper and lower respiratory specimens during the acute phase of infection. The lowest concentration of SARS-CoV-2 viral copies this assay can detect is 250 copies / mL.  A negative result does not preclude SARS-CoV-2 infection and should not be used as the sole basis for treatment or other patient management decisions.  A negative result may occur with improper specimen collection / handling, submission of specimen other than nasopharyngeal swab, presence of viral mutation(s) within the areas targeted by this assay, and inadequate number of viral copies (<250 copies / mL). A negative result must be combined with clinical observations, patient history, and epidemiological information.  Fact Sheet for Patients:   BoilerBrush.com.cy  Fact Sheet for Healthcare Providers: https://pope.com/  This test is not yet approved or  cleared by the [place] FDA and has been authorized for detection and/or diagnosis of SARS-CoV-2 by  FDA under an Emergency Use Authorization (EUA).  This EUA will remain in effect (meaning this test can be used) for the duration of the COVID-19 declaration under Section 564(b)(1) of the Act, 21 U.S.C. section 360bbb-3(b)(1), unless the authorization is terminated or revoked sooner.  Performed at [facility], [address] [place], [place] [address]   Blood Culture (routine x 2)     Status: None (Preliminary result)   Collection Time: 12/11/19  2:03 PM   Specimen: BLOOD  Result Value Ref Range Status   Specimen Description BLOOD BLOOD LEFT FOREARM  Final   Special Requests   Final    BOTTLES DRAWN AEROBIC AND ANAEROBIC Blood Culture adequate volume   Culture   Final    NO GROWTH 2 DAYS Performed at [facility], [address] [place], [place] [address]    Report Status PENDING  Incomplete  Expectorated sputum assessment w rflx to resp cult     Status: None   Collection Time: 12/11/19  8:50 PM   Specimen: Urine, Clean Catch; Sputum  Result Value Ref Range Status   Specimen Description Expect. Sput  Final   Special Requests NONE  Final   Sputum  evaluation   Final    THIS SPECIMEN IS ACCEPTABLE FOR SPUTUM CULTURE Performed at [facility], [address] [place], [place] [address]    Report Status 12/12/2019 FINAL  Final  Culture, respiratory     Status: None (Preliminary result)   Collection Time: 12/11/19  8:50 PM  Result Value Ref Range Status   Specimen Description   Final    Expect. Sput Performed at [facility], [address] [place], [place] [address]    Special Requests   Final    NONE Reflexed from [id number] Performed at [facility], [address] [place], [place] [address]    Gram Stain   Final    MODERATE WBC PRESENT, PREDOMINANTLY PMN RARE SQUAMOUS EPITHELIAL CELLS PRESENT FEW GRAM POSITIVE COCCI IN PAIRS RARE GRAM POSITIVE RODS RARE GRAM NEGATIVE RODS    Culture   Final    CULTURE REINCUBATED FOR BETTER GROWTH Performed at [facility], [address] [place], [place] [address]    Report Status PENDING  Incomplete  Urine culture     Status: None   Collection Time: 12/11/19  8:52 PM   Specimen: Urine, Random  Result Value Ref Range Status   Specimen Description   Final    URINE, RANDOM Performed at [facility], [address] [place], [place] [address]    Special Requests   Final    NONE Performed at [facility], [address] [place], [place] [address]    Culture   Final    NO GROWTH Performed at [facility], [address] [place], [place] [address]    Report Status 12/13/2019 FINAL  Final  Expectorated sputum assessment w rflx to resp cult     Status: None (Preliminary result)   Collection Time: 12/11/19  8:52 PM   Specimen: Sputum  Result Value Ref Range Status   Specimen Description SPUTUM  Final   Special Requests NONE  Final   Sputum evaluation   Final    THIS SPECIMEN IS ACCEPTABLE FOR SPUTUM CULTURE Performed at [facility], [address] [place], [place] [address]    Report Status PENDING   Incomplete  Respiratory Panel by RT PCR (Flu A&B, Covid) - Nasopharyngeal Swab  Status: None   Collection Time: 12/12/19 10:55 AM   Specimen: Nasopharyngeal Swab  Result Value Ref Range Status   SARS Coronavirus 2 by RT PCR NEGATIVE NEGATIVE Final    Comment: (NOTE) SARS-CoV-2 target nucleic acids are NOT DETECTED.  The SARS-CoV-2 RNA is generally detectable in upper respiratoy specimens during the acute phase of infection. The lowest concentration of SARS-CoV-2 viral copies this assay can detect is 131 copies/mL. A negative result does not preclude SARS-Cov-2 infection and should not be used as the sole basis for treatment or other patient management decisions. A negative result may occur with  improper specimen collection/handling, submission of specimen other than nasopharyngeal swab, presence of viral mutation(s) within the areas targeted by this assay, and inadequate number of viral copies (<131 copies/mL). A negative result must be combined with clinical observations, patient history, and epidemiological information. The expected result is Negative.  Fact Sheet for Patients:  https://www.moore.com/  Fact Sheet for Healthcare Providers:  https://www.young.biz/  This test is no t yet approved or cleared by the [place] FDA and  has been authorized for detection and/or diagnosis of SARS-CoV-2 by FDA under an Emergency Use Authorization (EUA). This EUA will remain  in effect (meaning this test can be used) for the duration of the COVID-19 declaration under Section 564(b)(1) of the Act, 21 U.S.C. section 360bbb-3(b)(1), unless the authorization is terminated or revoked sooner.     Influenza A by PCR NEGATIVE NEGATIVE Final   Influenza B by PCR NEGATIVE NEGATIVE Final    Comment: (NOTE) The Xpert Xpress SARS-CoV-2/FLU/RSV assay is intended as an aid in  the diagnosis of influenza from Nasopharyngeal swab specimens and  should not  be used as a sole basis for treatment. Nasal washings and  aspirates are unacceptable for Xpert Xpress SARS-CoV-2/FLU/RSV  testing.  Fact Sheet for Patients: https://www.moore.com/  Fact Sheet for Healthcare Providers: https://www.young.biz/  This test is not yet approved or cleared by the [place] FDA and  has been authorized for detection and/or diagnosis of SARS-CoV-2 by  FDA under an Emergency Use Authorization (EUA). This EUA will remain  in effect (meaning this test can be used) for the duration of the  Covid-19 declaration under Section 564(b)(1) of the Act, 21  U.S.C. section 360bbb-3(b)(1), unless the authorization is  terminated or revoked. Performed at [facility], [address] [place], [place] [address]       Radiology Studies: CT Angio Chest PE W and/or Wo Contrast  Result Date: 12/11/2019 CLINICAL DATA:  Dyspnea EXAM: CT ANGIOGRAPHY CHEST WITH CONTRAST TECHNIQUE: Multidetector CT imaging of the chest was performed using the standard protocol during bolus administration of intravenous contrast. Multiplanar CT image reconstructions and MIPs were obtained to evaluate the vascular anatomy. CONTRAST:  75mL OMNIPAQUE IOHEXOL 350 MG/ML SOLN COMPARISON:  None. FINDINGS: Cardiovascular: Satisfactory opacification of the pulmonary arteries to the segmental level. No evidence of pulmonary embolism. The central pulmonary arteries are enlarged in keeping with changes of pulmonary arterial hypertension. Moderate coronary artery calcification primarily within the left anterior descending coronary artery. Global cardiac size within normal limits. No pericardial effusion. Thoracic aorta is unremarkable. Mediastinum/Nodes: There is extensive shotty mediastinal and bilateral hilar adenopathy, likely reactive in nature. No frankly pathologic mediastinal adenopathy. Visualized thyroid is unremarkable. Esophagus is unremarkable. Lungs/Pleura:  There is asymmetric right-sided volume loss, similar to that noted on prior chest radiograph. There is peripheral, largely subpleural architectural distortion and honeycombing as well as traction bronchiectasis, best noted within the right  upper lobe and lower lobes bilaterally. There is extensive asymmetric ground-glass pulmonary infiltrate throughout the right lung though small amount is also identified within the left lung base. The degree of architectural distortion is relatively minor. The findings are suggestive of for a broad acute interstitial lung disease and though nonspecific interstitial pneumonia (NSIP) is favored, the differential would include UIP. This is not optimally characterized on this examination. There is, however, more superimposed consolidation within the left upper lobe which appears distinct from the more diffuse pulmonary process and likely represents acute lobar pneumonia in the appropriate clinical setting. No pneumothorax or pleural effusion. No central obstructing mass lesion. Upper Abdomen: Unremarkable Musculoskeletal: No acute bone abnormality. Review of the MIP images confirms the above findings. IMPRESSION: No pulmonary embolism. Right-sided volume loss with subpleural architectural distortion/honeycombing. Extensive superimposed ground-glass pulmonary infiltrate. 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 Follow-up high-resolution CT examination of the chest is recommended in 3 months, after appropriate antibiotic therapy for the patient's underlying acute pneumonia. Pulmonary consultation would be helpful for further management. Superimposed consolidation within the left upper lobe most in keeping with acute lobar pneumonia. Moderate coronary artery calcification. Electronically Signed   By: Helyn NumbersAshesh  Parikh MD   On: 12/11/2019 15:37   DG Chest Portable 1 View  Result Date: 12/11/2019 CLINICAL DATA:   Shortness of breath EXAM: PORTABLE CHEST 1 VIEW COMPARISON:  None. FINDINGS: Heart size is within normal limits. Low lung volumes. Extensive bilateral interstitial opacities throughout both lungs. No large pleural fluid collection. No pneumothorax. IMPRESSION: Extensive bilateral interstitial opacities throughout both lungs. Findings may represent pulmonary edema versus multifocal atypical/viral infection. Electronically Signed   By: Duanne GuessNicholas  Plundo D.O.   On: 12/11/2019 14:22     Scheduled Meds:  enoxaparin (LOVENOX) injection  40 mg Subcutaneous Q24H   levofloxacin  750 mg Oral Daily   methylPREDNISolone (SOLU-MEDROL) injection  20 mg Intravenous BID   pantoprazole  40 mg Oral BID   pneumococcal 23 valent vaccine  0.5 mL Intramuscular Tomorrow-1000   Continuous Infusions:  sodium chloride Stopped (12/12/19 1432)     LOS: 2 days     Darlin Priestlyina Bucky Grigg, MD [facility] If 7PM-7AM, please contact night-coverage 12/13/2019, 1:26 PM

## 2019-12-13 NOTE — Progress Notes (Signed)
Pulmonary Medicine          Date: 12/13/2019,   MRN# [id number] Donald Sellers 1951-03-15     AdmissionWeight: 81.6 kg                 CurrentWeight: 83.6 kg  Referring physician: Dr Donald Sellers    CHIEF COMPLAINT:   Acute hypoxemic respiratory failure   SUBJECTIVE   Patient is feeling better this am , he is on 4L/min [place]. He is tachycardic and does have labored breathing still. Plan to continue tx for CAP and low dose steroids for AEIPF.     Will gently diurese with lasix 20 today, patient has anxiety and we will start xanax QHS to help patient while on steroids.  He is still tachycardic and we have started mild metoprolol 12.5bid.    PAST MEDICAL HISTORY   Past Medical History:  Diagnosis Date  . Patient denies medical problems      SURGICAL HISTORY   Past Surgical History:  Procedure Laterality Date  . EYE SURGERY       FAMILY HISTORY   Family History  Problem Relation Age of Onset  . Pulmonary fibrosis Neg Hx      SOCIAL HISTORY   Social History   Tobacco Use  . Smoking status: Former Smoker    Packs/day: 1.00    Years: 11.00    Pack years: 11.00    Types: Cigarettes    Quit date: 1980    Years since quitting: 41.5  . Smokeless tobacco: Never Used  Substance Use Topics  . Alcohol use: Yes  . Drug use: Never     MEDICATIONS    Home Medication:    Current Medication:  Current Facility-Administered Medications:  .  0.9 %  sodium chloride infusion, , Intravenous, PRN, Donald Priestly, MD, Paused at 12/12/19 1432 .  acetaminophen (TYLENOL) tablet 1,000 mg, 1,000 mg, Oral, Q8H PRN, Donald Priestly, MD .  ALPRAZolam Prudy Feeler) tablet 0.5 mg, 0.5 mg, Oral, QHS, Donald Washinton, MD .  alum & mag hydroxide-simeth (MAALOX/MYLANTA) 200-200-20 MG/5ML suspension 15 mL, 15 mL, Oral, Q6H PRN, Donald Priestly, MD .  calcium carbonate (TUMS - dosed in mg elemental calcium) chewable tablet 200 mg of elemental calcium, 1 tablet, Oral, TID PRN, Donald Priestly, MD .  docusate  sodium (COLACE) capsule 100 mg, 100 mg, Oral, BID PRN, Donald Priestly, MD .  enoxaparin (LOVENOX) injection 40 mg, 40 mg, Subcutaneous, Q24H, Donald Priestly, MD, 40 mg at 12/12/19 2132 .  furosemide (LASIX) injection 40 mg, 40 mg, Intravenous, Daily, Donald Sandiford, MD .  guaiFENesin-dextromethorphan (ROBITUSSIN DM) 100-10 MG/5ML syrup 10 mL, 10 mL, Oral, Q6H PRN, Donald Priestly, MD .  levofloxacin (LEVAQUIN) tablet 750 mg, 750 mg, Oral, Daily, Donald Rigger, MD, 750 mg at 12/13/19 1232 .  magnesium hydroxide (MILK OF MAGNESIA) suspension 30 mL, 30 mL, Oral, Daily, Donald Sellers, Donald Ormand, MD .  ondansetron (ZOFRAN) injection 4 mg, 4 mg, Intravenous, Q6H PRN, Donald Priestly, MD .  ondansetron (ZOFRAN-ODT) disintegrating tablet 4 mg, 4 mg, Oral, Q8H PRN, Donald Priestly, MD .  pantoprazole (PROTONIX) EC tablet 40 mg, 40 mg, Oral, BID, Donald Rigger, MD, 40 mg at 12/13/19 1232 .  pneumococcal 23 valent vaccine (PNEUMOVAX-23) injection 0.5 mL, 0.5 mL, Intramuscular, Tomorrow-1000, Amin, Sumayya, MD .  polyethylene glycol (MIRALAX / GLYCOLAX) packet 17 g, 17 g, Oral, BID PRN, Donald Priestly, MD .  Melene Muller ON 12/14/2019] predniSONE (DELTASONE) tablet 40 mg, 40 mg, Oral, Q breakfast, Donald Broeker,  MD    ALLERGIES   Patient has no known allergies.     REVIEW OF SYSTEMS    Review of Systems:  Gen:  Denies  fever, sweats, chills weigh loss  HEENT: Denies blurred vision, double vision, ear pain, eye pain, hearing loss, nose bleeds, sore throat Cardiac:  No dizziness, chest pain or heaviness, chest tightness,edema Resp:   Denies cough or sputum porduction, shortness of breath,wheezing, hemoptysis,  Gi: Denies swallowing difficulty, stomach pain, nausea or vomiting, diarrhea, constipation, bowel incontinence Gu:  Denies bladder incontinence, burning urine Ext:   Denies Joint pain, stiffness or swelling Skin: Denies  skin rash, easy bruising or bleeding or hives Endoc:  Denies polyuria, polydipsia , polyphagia or weight  change Psych:   Denies depression, insomnia or hallucinations   Other:  All other systems negative   VS: BP 117/77 (BP Location: Left Arm)   Pulse 100   Temp (!) 97.5 F (36.4 C) (Oral)   Resp 20   Ht 5\' 10"  (1.778 m)   Wt 83.6 kg   SpO2 96%   BMI 26.43 kg/m      PHYSICAL EXAM    GENERAL:NAD, no fevers, chills, no weakness no fatigue HEAD: Normocephalic, atraumatic.  EYES: Pupils equal, round, reactive to light. Extraocular muscles intact. No scleral icterus.  MOUTH: Moist mucosal membrane. Dentition intact. No abscess noted.  EAR, NOSE, THROAT: Clear without exudates. No external lesions.  NECK: Supple. No thyromegaly. No nodules. No JVD.  PULMONARY: Diffuse coarse rhonchi right sided +wheezes CARDIOVASCULAR: S1 and S2. Regular rate and rhythm. No murmurs, rubs, or gallops. No edema. Pedal pulses 2+ bilaterally.  GASTROINTESTINAL: Soft, nontender, nondistended. No masses. Positive bowel sounds. No hepatosplenomegaly.  MUSCULOSKELETAL: No swelling, clubbing, or edema. Range of motion full in all extremities.  NEUROLOGIC: Cranial nerves II through XII are intact. No gross focal neurological deficits. Sensation intact. Reflexes intact.  SKIN: No ulceration, lesions, rashes, or cyanosis. Skin warm and dry. Turgor intact.  PSYCHIATRIC: Mood, affect within normal limits. The patient is awake, alert and oriented x 3. Insight, judgment intact.       IMAGING    CT Angio Chest PE W and/or Wo Contrast  Result Date: 12/11/2019 CLINICAL DATA:  Dyspnea EXAM: CT ANGIOGRAPHY CHEST WITH CONTRAST TECHNIQUE: Multidetector CT imaging of the chest was performed using the standard protocol during bolus administration of intravenous contrast. Multiplanar CT image reconstructions and MIPs were obtained to evaluate the vascular anatomy. CONTRAST:  54mL OMNIPAQUE IOHEXOL 350 MG/ML SOLN COMPARISON:  None. FINDINGS: Cardiovascular: Satisfactory opacification of the pulmonary arteries to the  segmental level. No evidence of pulmonary embolism. The central pulmonary arteries are enlarged in keeping with changes of pulmonary arterial hypertension. Moderate coronary artery calcification primarily within the left anterior descending coronary artery. Global cardiac size within normal limits. No pericardial effusion. Thoracic aorta is unremarkable. Mediastinum/Nodes: There is extensive shotty mediastinal and bilateral hilar adenopathy, likely reactive in nature. No frankly pathologic mediastinal adenopathy. Visualized thyroid is unremarkable. Esophagus is unremarkable. Lungs/Pleura: There is asymmetric right-sided volume loss, similar to that noted on prior chest radiograph. There is peripheral, largely subpleural architectural distortion and honeycombing as well as traction bronchiectasis, best noted within the right upper lobe and lower lobes bilaterally. There is extensive asymmetric ground-glass pulmonary infiltrate throughout the right lung though small amount is also identified within the left lung base. The degree of architectural distortion is relatively minor. The findings are suggestive of for a broad acute interstitial lung disease and though nonspecific  interstitial pneumonia (NSIP) is favored, the differential would include UIP. This is not optimally characterized on this examination. There is, however, more superimposed consolidation within the left upper lobe which appears distinct from the more diffuse pulmonary process and likely represents acute lobar pneumonia in the appropriate clinical setting. No pneumothorax or pleural effusion. No central obstructing mass lesion. Upper Abdomen: Unremarkable Musculoskeletal: No acute bone abnormality. Review of the MIP images confirms the above findings. IMPRESSION: No pulmonary embolism. Right-sided volume loss with subpleural architectural distortion/honeycombing. Extensive superimposed ground-glass pulmonary infiltrate. 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 Follow-up high-resolution CT examination of the chest is recommended in 3 months, after appropriate antibiotic therapy for the patient's underlying acute pneumonia. Pulmonary consultation would be helpful for further management. Superimposed consolidation within the left upper lobe most in keeping with acute lobar pneumonia. Moderate coronary artery calcification. Electronically Signed   By: Helyn Numbers MD   On: 12/11/2019 15:37   DG Chest Portable 1 View  Result Date: 12/11/2019 CLINICAL DATA:  Shortness of breath EXAM: PORTABLE CHEST 1 VIEW COMPARISON:  None. FINDINGS: Heart size is within normal limits. Low lung volumes. Extensive bilateral interstitial opacities throughout both lungs. No large pleural fluid collection. No pneumothorax. IMPRESSION: Extensive bilateral interstitial opacities throughout both lungs. Findings may represent pulmonary edema versus multifocal atypical/viral infection. Electronically Signed   By: Duanne Guess D.O.   On: 12/11/2019 14:22      ASSESSMENT/PLAN   Acute exacerbation of Idiopathic pulmonary fibosis   - there is left upper lobe consolidation suggestive of acute infectious cause of current exacerbation   - will obtain serology for ANA comprehensive, cryptococcal antigen, aspergillus ab, fungitell   - Respiratory viral panel -not done yet   - sputum bacterial cultures   - procalcitonin trend   - MRSA PCR   - empiric low dose steroids -solumedrol 20 bid>>prednisone 40   -Rocephin /Zothromax >>Levoquin 750 po daily    GERD   -this should be very tightly controlled while in acute exacerbation of IPF - will start protonix 40 bid     Thank you for allowing me to participate in the care of this patient.   Patient/Family are satisfied with care plan and all questions have been answered.  This document was prepared using Dragon voice recognition software and may  include unintentional dictation errors.     Donald Sellers, M.D.  Division of Pulmonary & Critical Care Medicine  [facility]

## 2019-12-14 ENCOUNTER — Inpatient Hospital Stay (HOSPITAL_COMMUNITY)
Admit: 2019-12-14 | Discharge: 2019-12-14 | Disposition: A | Discharge status: Home / Self Care | Payer: BC Managed Care – PPO | Attending: Pulmonary Disease | Admitting: Pulmonary Disease

## 2019-12-14 DIAGNOSIS — I5031 Acute diastolic (congestive) heart failure: Secondary | ICD-10-CM

## 2019-12-14 DIAGNOSIS — J9601 Acute respiratory failure with hypoxia: Secondary | ICD-10-CM | POA: Diagnosis not present

## 2019-12-14 LAB — CULTURE, RESPIRATORY W GRAM STAIN: Culture: NORMAL

## 2019-12-14 LAB — HISTOPLASMA ANTIGEN, URINE: Histoplasma Antigen, urine: <0.5 (ref <0.5)

## 2019-12-14 LAB — ECHOCARDIOGRAM COMPLETE
AR max vel: 2.53 cm2
AV Area VTI: 3.07 cm2
AV Area mean vel: 2.71 cm2
AV Mean grad: 3.5 mmHg
AV Peak grad: 5.9 mmHg
Ao pk vel: 1.21 m/s
Area-P 1/2: 3.4 cm2
Height: 70 in
S' Lateral: 2.72 cm
Weight: 2947.2 oz

## 2019-12-14 LAB — ANA COMPREHENSIVE PANEL
Anti JO-1: <0.2 AI (ref 0.0–0.9)
Centromere Ab Screen: <0.2 AI (ref 0.0–0.9)
Chromatin Ab SerPl-aCnc: <0.2 AI (ref 0.0–0.9)
ENA SM Ab Ser-aCnc: <0.2 AI (ref 0.0–0.9)
Ribonucleic Protein: 0.6 AI (ref 0.0–0.9)
SSA (Ro) (ENA) Antibody, IgG: <0.2 AI (ref 0.0–0.9)
SSB (La) (ENA) Antibody, IgG: <0.2 AI (ref 0.0–0.9)
Scleroderma (Scl-70) (ENA) Antibody, IgG: <0.2 AI (ref 0.0–0.9)
ds DNA Ab: <1 IU/mL (ref 0–9)

## 2019-12-14 LAB — BASIC METABOLIC PANEL
Anion gap: 8 (ref 5–15)
BUN: 27 mg/dL — ABNORMAL HIGH (ref 8–23)
CO2: 31 mmol/L (ref 22–32)
Calcium: 9 mg/dL (ref 8.9–10.3)
Chloride: 100 mmol/L (ref 98–111)
Creatinine, Ser: 0.89 mg/dL (ref 0.61–1.24)
GFR calc Af Amer: >60 mL/min (ref >60)
GFR calc non Af Amer: >60 mL/min (ref >60)
Glucose, Bld: 97 mg/dL (ref 70–99)
Potassium: 4.8 mmol/L (ref 3.5–5.1)
Sodium: 139 mmol/L (ref 135–145)

## 2019-12-14 LAB — CBC
HCT: 41.7 % (ref 39.0–52.0)
Hemoglobin: 13.7 g/dL (ref 13.0–17.0)
MCH: 29.7 pg (ref 26.0–34.0)
MCHC: 32.9 g/dL (ref 30.0–36.0)
MCV: 90.3 fL (ref 80.0–100.0)
Platelets: 387 10*3/uL (ref 150–400)
RBC: 4.62 MIL/uL (ref 4.22–5.81)
RDW: 13.3 % (ref 11.5–15.5)
WBC: 15.1 10*3/uL — ABNORMAL HIGH (ref 4.0–10.5)
nRBC: 0 % (ref 0.0–0.2)

## 2019-12-14 LAB — MAGNESIUM: Magnesium: 2.2 mg/dL (ref 1.7–2.4)

## 2019-12-14 NOTE — Progress Notes (Signed)
PROGRESS NOTE    Donald Sellers  UJW:[id number] DOB: 1950-07-30 DOA: 12/11/2019 PCP: Patient, No Pcp Per    Assessment & Plan:   Active Problems:   Acute respiratory failure with hypoxia (HCC)    Donald Sellers is a 69 y.o. Caucasian male with no significant medical history who presented with acute worsening of dyspnea.     # Acute hypoxic respiratory failure  --needed 6L on presentation.  COVID neg.   CTA neg for PE, but showed "Right-sided volume loss with subpleural architectural distortion/honeycombing. Extensive superimposed ground-glass pulmonary infiltrate" and "Superimposed consolidation within the left upper lobe."  --O2 requirement down to 4L on 7/24, pt reported breathing better. --TTE showed normal LVEF and grade I diastolic dysfunction. PLAN: --continue supplemental O2 to maintain sats >=92%, wean as able --Treat the following conditions --Trail IV lasix 20 mg daily  # Acute exacerbation of Pulmonary fibrosis --New dx.  Per hx, likely slowly progressing and acutely worsened due to superimposed PNA --procal neg x2 --cryptococcal antigen neg PLAN: --continue supplemental O2 to maintain sats >=92%, wean as able --Pulm consult rec   - serology for ANA comprehensive, aspergillus ab, fungitell pending   - Respiratory viral panel   - sputum bacterial cultures pending   - MRSA PCR   - switch steroid to prednisone 40 mg daily (from solumedrol 20 mg BID)  # Left upper lobe PNA --presented with leukocytosis, but no fever and procal neg.  CTA showed consolidation. --started on ceftriaxone and azithro in the ED, and switched to Levaquin on 7/24 PLAN: --continue Levaquin  # GERD --Per pulm GERD should be tightly controlled while in acute exacerbation of IPF --continue protonix BID   DVT prophylaxis: Lovenox SQ Code Status: Full code  Family Communication:  Status is: inpatient Dispo:   The patient is from: home Anticipated d/c is to: home Anticipated d/c date  is: 1-2 days Patient currently is not medically stable to d/c due to: still on 4L supplemental O2, need pulm clearance for discharge.  Likely will need home O2.   Subjective and Interval History:  Pt reported feeling better, slept better with Xanax.  Good urine output with IV lasix.  Still on 4L O2.   Objective: Vitals:   12/13/19 2317 12/14/19 0427 12/14/19 0820 12/14/19 1203  BP: 104/75 104/73 101/73 106/72  Pulse: 67 71 85 97  Resp: Temp: 98.1 F (36.7 C) 97.9 F (36.6 C) (!) 97.5 F (36.4 C) 98.2 F (36.8 C)  TempSrc: Oral Oral Oral   SpO2: 94% 94% 94% 91%  Weight:      Height:        Intake/Output Summary (Last 24 hours) at 12/14/2019 1903 Last data filed at 12/14/2019 1833 Gross per 24 hour  Intake 1659 ml  Output 1410 ml  Net 249 ml   Filed Weights   12/11/19 1335 12/11/19 2038  Weight: 81.6 kg 83.6 kg    Examination:   Constitutional: NAD, AAOx3 HEENT: conjunctivae and lids normal, EOMI CV: RRR tachycardic. Distal pulses +2.  No cyanosis.   RESP: loud fine crackles over bilateral posterior bases, normal respiratory effort, on 4L GI: +BS, NTND Extremities: No effusions, edema, or tenderness in BLE SKIN: warm, dry and intact Neuro: II - XII grossly intact.  Sensation intact Psych: Normal mood and affect.  Appropriate judgement and reason     Data Reviewed: I have personally reviewed following labs and imaging studies  CBC: Recent Labs  Lab 12/11/19 1314 12/12/19 0503  12/13/19 0620 12/14/19 0518  WBC 18.0* 9.5 18.0* 15.1*  NEUTROABS 14.3*  --   --   --   HGB 14.8 14.0 13.7 13.7  HCT 45.3 41.6 40.9 41.7  MCV 89.3 89.3 88.9 90.3  PLT 354 334 366 387   Basic Metabolic Panel: Recent Labs  Lab 12/11/19 1314 12/12/19 0503 12/13/19 0620 12/14/19 0518  NA 135  --  136 139  K 4.0  --  5.5* 4.8  CL 99  --  100 100  CO2 24  --  26 31  GLUCOSE 115*  --  123* 97  BUN 10  --  22 27*  CREATININE 0.85  --  0.77 0.89  CALCIUM 9.4  --   9.3 9.0  MG  --  2.2 2.3 2.2   GFR: Estimated Creatinine Clearance: 83.2 mL/min (by C-G formula based on SCr of 0.89 mg/dL). Liver Function Tests: Recent Labs  Lab 12/11/19 1314  AST 27  ALT 16  ALKPHOS 116  BILITOT 1.3*  PROT 8.9*  ALBUMIN 4.0   No results for input(s): LIPASE, AMYLASE in the last 168 hours. No results for input(s): AMMONIA in the last 168 hours. Coagulation Profile: No results for input(s): INR, PROTIME in the last 168 hours. Cardiac Enzymes: No results for input(s): CKTOTAL, CKMB, CKMBINDEX, TROPONINI in the last 168 hours. BNP (last 3 results) No results for input(s): PROBNP in the last 8760 hours. HbA1C: No results for input(s): HGBA1C in the last 72 hours. CBG: No results for input(s): GLUCAP in the last 168 hours. Lipid Profile: No results for input(s): CHOL, HDL, LDLCALC, TRIG, CHOLHDL, LDLDIRECT in the last 72 hours. Thyroid Function Tests: No results for input(s): TSH, T4TOTAL, FREET4, T3FREE, THYROIDAB in the last 72 hours. Anemia Panel: No results for input(s): VITAMINB12, FOLATE, FERRITIN, TIBC, IRON, RETICCTPCT in the last 72 hours. Sepsis Labs: Recent Labs  Lab 12/11/19 1314 12/13/19 0620  PROCALCITON <0.10 <0.10  LATICACIDVEN 1.8  --     Recent Results (from the past 240 hour(s))  Blood Culture (routine x 2)     Status: None (Preliminary result)   Collection Time: 12/11/19  1:14 PM   Specimen: BLOOD  Result Value Ref Range Status   Specimen Description BLOOD RIGHT ANTECUBITAL  Final   Special Requests   Final    BOTTLES DRAWN AEROBIC AND ANAEROBIC Blood Culture adequate volume   Culture   Final    NO GROWTH 3 DAYS Performed at [facility], [address] [place], [place] [address]    Report Status PENDING  Incomplete  SARS Coronavirus 2 by RT PCR (hospital order, performed in [facility] hospital lab) Nasopharyngeal Nasopharyngeal Swab     Status: None   Collection Time: 12/11/19  2:02 PM   Specimen: Nasopharyngeal  Swab  Result Value Ref Range Status   SARS Coronavirus 2 NEGATIVE NEGATIVE Final    Comment: (NOTE) SARS-CoV-2 target nucleic acids are NOT DETECTED.  The SARS-CoV-2 RNA is generally detectable in upper and lower respiratory specimens during the acute phase of infection. The lowest concentration of SARS-CoV-2 viral copies this assay can detect is 250 copies / mL. A negative result does not preclude SARS-CoV-2 infection and should not be used as the sole basis for treatment or other patient management decisions.  A negative result may occur with improper specimen collection / handling, submission of specimen other than nasopharyngeal swab, presence of viral mutation(s) within the areas targeted by this assay, and inadequate number of viral copies (<  250 copies / mL). A negative result must be combined with clinical observations, patient history, and epidemiological information.  Fact Sheet for Patients:   BoilerBrush.com.cy  Fact Sheet for Healthcare Providers: https://pope.com/  This test is not yet approved or  cleared by the [place] FDA and has been authorized for detection and/or diagnosis of SARS-CoV-2 by FDA under an Emergency Use Authorization (EUA).  This EUA will remain in effect (meaning this test can be used) for the duration of the COVID-19 declaration under Section 564(b)(1) of the Act, 21 U.S.C. section 360bbb-3(b)(1), unless the authorization is terminated or revoked sooner.  Performed at [facility], [address] [place], [place] [address]   Blood Culture (routine x 2)     Status: None (Preliminary result)   Collection Time: 12/11/19  2:03 PM   Specimen: BLOOD  Result Value Ref Range Status   Specimen Description BLOOD BLOOD LEFT FOREARM  Final   Special Requests   Final    BOTTLES DRAWN AEROBIC AND ANAEROBIC Blood Culture adequate volume   Culture   Final    NO GROWTH 3 DAYS Performed at  [facility], [address], [place], [place] [address]    Report Status PENDING  Incomplete  Expectorated sputum assessment w rflx to resp cult     Status: None   Collection Time: 12/11/19  8:50 PM   Specimen: Urine, Clean Catch; Sputum  Result Value Ref Range Status   Specimen Description Expect. Sput  Final   Special Requests NONE  Final   Sputum evaluation   Final    THIS SPECIMEN IS ACCEPTABLE FOR SPUTUM CULTURE Performed at [facility], [address] [place], [place] [address]    Report Status 12/12/2019 FINAL  Final  Culture, respiratory     Status: None   Collection Time: 12/11/19  8:50 PM  Result Value Ref Range Status   Specimen Description   Final    Expect. Sput Performed at [facility], [address] [place], [place] [address]    Special Requests   Final    NONE Reflexed from [id number] Performed at [facility], [address] [place], [place] [address]    Gram Stain   Final    MODERATE WBC PRESENT, PREDOMINANTLY PMN RARE SQUAMOUS EPITHELIAL CELLS PRESENT FEW GRAM POSITIVE COCCI IN PAIRS RARE GRAM POSITIVE RODS RARE GRAM NEGATIVE RODS    Culture   Final    Consistent with normal respiratory flora. Performed at [facility], [address], [place], [place] [address]    Report Status 12/14/2019 FINAL  Final  Urine culture     Status: None   Collection Time: 12/11/19  8:52 PM   Specimen: Urine, Random  Result Value Ref Range Status   Specimen Description   Final    URINE, RANDOM Performed at [facility], [address], [place], [place] [address]    Special Requests   Final    NONE Performed at [facility], [address] [place], [place] [address]    Culture   Final    NO GROWTH Performed at [facility], [address], [place], [place] [address]    Report Status 12/13/2019 FINAL  Final  Expectorated sputum assessment w rflx to resp cult     Status: None (Preliminary  result)   Collection Time: 12/11/19  8:52 PM   Specimen: Sputum  Result Value Ref Range Status   Specimen Description SPUTUM  Final   Special Requests NONE  Final   Sputum evaluation   Final    THIS SPECIMEN IS ACCEPTABLE FOR SPUTUM CULTURE Performed at [facility], [address] [place], [place] [address]    Report Status PENDING  Incomplete  Respiratory Panel by RT PCR (Flu A&B, Covid) - Nasopharyngeal Swab     Status: None   Collection Time: 12/12/19 10:55 AM   Specimen: Nasopharyngeal Swab  Result Value Ref Range Status   SARS Coronavirus 2 by RT PCR NEGATIVE NEGATIVE Final    Comment: (NOTE) SARS-CoV-2 target nucleic acids are NOT DETECTED.  The SARS-CoV-2 RNA is generally detectable in upper respiratoy specimens during the acute phase of infection. The lowest concentration of SARS-CoV-2 viral copies this assay can detect is 131 copies/mL. A negative result does not preclude SARS-Cov-2 infection and should not be used as the sole basis for treatment or other patient management decisions. A negative result may occur with  improper specimen collection/handling, submission of specimen other than nasopharyngeal swab, presence of viral mutation(s) within the areas targeted by this assay, and inadequate number of viral copies (<131 copies/mL). A negative result must be combined with clinical observations, patient history, and epidemiological information. The expected result is Negative.  Fact Sheet for Patients:  https://www.moore.com/https://www.fda.gov/media/142436/download  Fact Sheet for Healthcare Providers:  https://www.young.biz/https://www.fda.gov/media/142435/download  This test is no t yet approved or cleared by the [place] FDA and  has been authorized for detection and/or diagnosis of SARS-CoV-2 by FDA under an Emergency Use Authorization (EUA). This EUA will remain  in effect (meaning this test can be used) for the duration of the COVID-19 declaration under Section 564(b)(1) of the Act,  21 U.S.C. section 360bbb-3(b)(1), unless the authorization is terminated or revoked sooner.     Influenza A by PCR NEGATIVE NEGATIVE Final   Influenza B by PCR NEGATIVE NEGATIVE Final    Comment: (NOTE) The Xpert Xpress SARS-CoV-2/FLU/RSV assay is intended as an aid in  the diagnosis of influenza from Nasopharyngeal swab specimens and  should not be used as a sole basis for treatment. Nasal washings and  aspirates are unacceptable for Xpert Xpress SARS-CoV-2/FLU/RSV  testing.  Fact Sheet for Patients: https://www.moore.com/https://www.fda.gov/media/142436/download  Fact Sheet for Healthcare Providers: https://www.young.biz/https://www.fda.gov/media/142435/download  This test is not yet approved or cleared by the [place] FDA and  has been authorized for detection and/or diagnosis of SARS-CoV-2 by  FDA under an Emergency Use Authorization (EUA). This EUA will remain  in effect (meaning this test can be used) for the duration of the  Covid-19 declaration under Section 564(b)(1) of the Act, 21  U.S.C. section 360bbb-3(b)(1), unless the authorization is  terminated or revoked. Performed at [facility], [address] [place], [place] [address]       Radiology Studies: ECHOCARDIOGRAM COMPLETE  Result Date: 12/14/2019    ECHOCARDIOGRAM REPORT   Patient Name:   Donald Sellers Date of Exam: 12/14/2019 Medical Rec #:  [id number]     Height:       70.0 in Accession #:    [id number]    Weight:       184.2 lb Date of Birth:  August 28, 1950    BSA:          2.016 m Patient Age:    68 years      BP:           101/73 mmHg Patient Gender: M             HR:  85 bpm. Exam Location:  [facility] Procedure: 2D Echo, Cardiac Doppler and Color Doppler Indications:     CHF-Acute diastolic 428.31  History:         Patient has no prior history of Echocardiogram examinations.                  CHF; COPD.  Sonographer:     Cristela Blue RDCS (AE) Referring Phys:  [id number] Vida Rigger Diagnosing Phys: Lorine Bears MD IMPRESSIONS  1. Left  ventricular ejection fraction, by estimation, is 55 to 60%. The left ventricle has normal function. The left ventricle has no regional wall motion abnormalities. There is mild left ventricular hypertrophy. Left ventricular diastolic parameters are consistent with Grade I diastolic dysfunction (impaired relaxation).  2. Right ventricular systolic function is normal. The right ventricular size is normal. There is moderately elevated pulmonary artery systolic pressure. The estimated right ventricular systolic pressure is 49.7 mmHg.  3. The mitral valve is normal in structure. No evidence of mitral valve regurgitation. No evidence of mitral stenosis.  4. The aortic valve is normal in structure. Aortic valve regurgitation is not visualized. No aortic stenosis is present. FINDINGS  Left Ventricle: Left ventricular ejection fraction, by estimation, is 55 to 60%. The left ventricle has normal function. The left ventricle has no regional wall motion abnormalities. The left ventricular internal cavity size was normal in size. There is  mild left ventricular hypertrophy. Left ventricular diastolic parameters are consistent with Grade I diastolic dysfunction (impaired relaxation). Right Ventricle: The right ventricular size is normal. No increase in right ventricular wall thickness. Right ventricular systolic function is normal. There is moderately elevated pulmonary artery systolic pressure. The tricuspid regurgitant velocity is 3.23 m/s, and with an assumed right atrial pressure of 8 mmHg, the estimated right ventricular systolic pressure is 49.7 mmHg. Left Atrium: Left atrial size was normal in size. Right Atrium: Right atrial size was normal in size. Pericardium: There is no evidence of pericardial effusion. Mitral Valve: The mitral valve is normal in structure. Normal mobility of the mitral valve leaflets. No evidence of mitral valve regurgitation. No evidence of mitral valve stenosis. Tricuspid Valve: The tricuspid valve  is normal in structure. Tricuspid valve regurgitation is mild . No evidence of tricuspid stenosis. Aortic Valve: The aortic valve is normal in structure. Aortic valve regurgitation is not visualized. No aortic stenosis is present. Aortic valve mean gradient measures 3.5 mmHg. Aortic valve peak gradient measures 5.9 mmHg. Aortic valve area, by VTI measures 3.07 cm. Pulmonic Valve: The pulmonic valve was normal in structure. Pulmonic valve regurgitation is not visualized. No evidence of pulmonic stenosis. Aorta: The aortic root is normal in size and structure. Venous: The inferior vena cava was not well visualized. IAS/Shunts: No atrial level shunt detected by color flow Doppler.  LEFT VENTRICLE PLAX 2D LVIDd:         4.24 cm  Diastology LVIDs:         2.72 cm  LV e' lateral:   6.64 cm/s LV PW:         1.03 cm  LV E/e' lateral: 6.8 LV IVS:        0.83 cm  LV e' medial:    5.11 cm/s LVOT diam:     2.00 cm  LV E/e' medial:  8.9 LV SV:         59 LV SV Index:   29 LVOT Area:     3.14 cm  RIGHT VENTRICLE RV Basal diam:  3.43  cm RV S prime:     10.20 cm/s TAPSE (M-mode): 3.0 cm LEFT ATRIUM             Index       RIGHT ATRIUM           Index LA diam:        3.10 cm 1.54 cm/m  RA Area:     12.80 cm LA Vol (A2C):   41.6 ml 20.64 ml/m RA Volume:   28.40 ml  14.09 ml/m LA Vol (A4C):   31.3 ml 15.53 ml/m LA Biplane Vol: 37.2 ml 18.46 ml/m  AORTIC VALVE                   PULMONIC VALVE AV Area (Vmax):    2.53 cm    RVOT Peak grad: 2 mmHg AV Area (Vmean):   2.71 cm AV Area (VTI):     3.07 cm AV Vmax:           121.00 cm/s AV Vmean:          87.100 cm/s AV VTI:            0.194 m AV Peak Grad:      5.9 mmHg AV Mean Grad:      3.5 mmHg LVOT Vmax:         97.30 cm/s LVOT Vmean:        75.200 cm/s LVOT VTI:          0.189 m LVOT/AV VTI ratio: 0.98  AORTA Ao Root diam: 3.60 cm MITRAL VALVE               TRICUSPID VALVE MV Area (PHT): 3.40 cm    TR Peak grad:   41.7 mmHg MV Decel Time: 223 msec    TR Vmax:        323.00  cm/s MV E velocity: 45.40 cm/s MV A velocity: 65.10 cm/s  SHUNTS MV E/A ratio:  0.70        Systemic VTI:  0.19 m                            Systemic Diam: 2.00 cm Lorine Bears MD Electronically signed by Lorine Bears MD Signature Date/Time: 12/14/2019/11:32:49 AM    Final      Scheduled Meds: . ALPRAZolam  0.5 mg Oral QHS  . enoxaparin (LOVENOX) injection  40 mg Subcutaneous Q24H  . furosemide  20 mg Intravenous Daily  . levofloxacin  750 mg Oral Daily  . magnesium hydroxide  30 mL Oral Daily  . metoprolol tartrate  12.5 mg Oral BID  . pantoprazole  40 mg Oral BID  . pneumococcal 23 valent vaccine  0.5 mL Intramuscular Tomorrow-1000  . predniSONE  40 mg Oral Q breakfast   Continuous Infusions: . sodium chloride Stopped (12/12/19 1432)     LOS: 3 days     Darlin Priestly, MD [facility] If 7PM-7AM, please contact night-coverage 12/14/2019, 7:03 PM

## 2019-12-14 NOTE — Progress Notes (Signed)
Date: 12/14/2019,   MRN# [id number] Donald Sellers 09-03-1950 Code Status:     Code Status Orders  (From admission, onward)         Start     Ordered   12/11/19 2015  Full code  Continuous        12/11/19 2015        Code Status History    This patient has a current code status but no historical code status.   Advance Care Planning Activity         HPI: Patient being seen by me for Dr Jan Fireman. Chart reviewed. Patient examined and discussed findings and plan. Still on 4 liters oxygen. But sats holding, he feels stronger. 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   PMHX:   Past Medical History:  Diagnosis Date  . Patient denies medical problems    Surgical Hx:  Past Surgical History:  Procedure Laterality Date  . EYE SURGERY     Family Hx:  Family History  Problem Relation Age of Onset  . Pulmonary fibrosis Neg Hx    Social Hx:   Social History   Tobacco Use  . Smoking status: Former Smoker    Packs/day: 1.00    Years: 11.00    Pack years: 11.00    Types: Cigarettes    Quit date: 1980    Years since quitting: 41.5  . Smokeless tobacco: Never Used  Substance Use Topics  . Alcohol use: Yes  . Drug use: Never   Medication:    Home Medication:    Current Medication: @CURMEDTAB @   Allergies:  Patient has no known allergies.  Review of Systems: Gen:  Denies  fever, sweats, chills HEENT: Denies blurred vision, double vision, ear pain, eye pain, hearing loss, nose bleeds, sore throat Cvc:  No dizziness, chest pain or heaviness Resp: since Friday his breathing has improved. Still dyspneic with ambulation around the bed on 4 lites 02   Gi: Denies swallowing difficulty, stomach pain, nausea or vomiting, diarrhea, constipation, bowel incontinence Gu:  Denies bladder incontinence, burning urine Ext:   No Joint pain, stiffness or swelling Skin: No skin rash, easy bruising or bleeding or hives Endoc:  No polyuria, polydipsia ,  polyphagia or weight change Psych: No depression, insomnia or hallucinations  Other:  All other systems negative  Physical Examination:   VS: BP 106/72 (BP Location: Left Arm)   Pulse 97   Temp 98.2 F (36.8 C)   Resp 17   Ht 5\' 10"  (1.778 m)   Wt 83.6 kg   SpO2 91%   BMI 26.43 kg/m   General Appearance: mild  Distress, on 4 liters  Neuro: without focal findings, mental status, speech normal, alert and oriented, cranial nerves 2-12 intact, reflexes normal and symmetric, sensation grossly normal  HEENT: PERRLA, EOM intact, no ptosis, no other lesions noticed, Mallampati: Pulmonary:.No wheezing, No rales  + crackles  Cardiovascular:  Normal S1,S2.  No m/r/g.  No rubs    Abdomen:Benign, Soft, non-tender, No masses, hepatosplenomegaly, No lymphadenopathy Endoc: No evident thyromegaly, no signs of acromegaly or Cushing features Skin:   warm, no rashes, no ecchymosis  Extremities: normal, no cyanosis, clubbing, no edema, warm with normal capillary refill. Other findings:   Labs results:   Recent Labs    12/12/19 0503 12/13/19 0620 12/14/19 0518  HGB 14.0 13.7 13.7  HCT 41.6 40.9 41.7  MCV 89.3 88.9 90.3  WBC 9.5 18.0* 15.1*  BUN  --  22 27*  CREATININE  --  0.77 0.89  GLUCOSE  --  123* 97  CALCIUM  --  9.3 9.0  ,    Rad results:   ECHOCARDIOGRAM COMPLETE  Result Date: 12/14/2019    ECHOCARDIOGRAM REPORT   Patient Name:   Donald Sellers Date of Exam: 12/14/2019 Medical Rec #:  [id number]     Height:       70.0 in Accession #:    [id number]    Weight:       184.2 lb Date of Birth:  December 07, 1950    BSA:          2.016 m Patient Age:    69 years      BP:           101/73 mmHg Patient Gender: M             HR:           85 bpm. Exam Location:  [facility] Procedure: 2D Echo, Cardiac Doppler and Color Doppler Indications:     CHF-Acute diastolic 428.31  History:         Patient has no prior history of Echocardiogram examinations.                  CHF; COPD.  Sonographer:     Cristela BlueJerry Hege RDCS  (AE) Referring Phys:  [id number] Vida RiggerFUAD ALESKEROV Diagnosing Phys: Lorine BearsMuhammad Arida MD IMPRESSIONS  1. Left ventricular ejection fraction, by estimation, is 55 to 60%. The left ventricle has normal function. The left ventricle has no regional wall motion abnormalities. There is mild left ventricular hypertrophy. Left ventricular diastolic parameters are consistent with Grade I diastolic dysfunction (impaired relaxation).  2. Right ventricular systolic function is normal. The right ventricular size is normal. There is moderately elevated pulmonary artery systolic pressure. The estimated right ventricular systolic pressure is 49.7 mmHg.  3. The mitral valve is normal in structure. No evidence of mitral valve regurgitation. No evidence of mitral stenosis.  4. The aortic valve is normal in structure. Aortic valve regurgitation is not visualized. No aortic stenosis is present. FINDINGS  Left Ventricle: Left ventricular ejection fraction, by estimation, is 55 to 60%. The left ventricle has normal function. The left ventricle has no regional wall motion abnormalities. The left ventricular internal cavity size was normal in size. There is  mild left ventricular hypertrophy. Left ventricular diastolic parameters are consistent with Grade I diastolic dysfunction (impaired relaxation). Right Ventricle: The right ventricular size is normal. No increase in right ventricular wall thickness. Right ventricular systolic function is normal. There is moderately elevated pulmonary artery systolic pressure. The tricuspid regurgitant velocity is 3.23 m/s, and with an assumed right atrial pressure of 8 mmHg, the estimated right ventricular systolic pressure is 49.7 mmHg. Left Atrium: Left atrial size was normal in size. Right Atrium: Right atrial size was normal in size. Pericardium: There is no evidence of pericardial effusion. Mitral Valve: The mitral valve is normal in structure. Normal mobility of the mitral valve leaflets. No evidence of  mitral valve regurgitation. No evidence of mitral valve stenosis. Tricuspid Valve: The tricuspid valve is normal in structure. Tricuspid valve regurgitation is mild . No evidence of tricuspid stenosis. Aortic Valve: The aortic valve is normal in structure. Aortic valve regurgitation is not visualized. No aortic stenosis is present. Aortic valve mean gradient measures 3.5 mmHg. Aortic valve peak gradient measures 5.9 mmHg. Aortic valve area, by VTI measures 3.07 cm. Pulmonic Valve: The pulmonic valve was normal in  structure. Pulmonic valve regurgitation is not visualized. No evidence of pulmonic stenosis. Aorta: The aortic root is normal in size and structure. Venous: The inferior vena cava was not well visualized. IAS/Shunts: No atrial level shunt detected by color flow Doppler.  LEFT VENTRICLE PLAX 2D LVIDd:         4.24 cm  Diastology LVIDs:         2.72 cm  LV e' lateral:   6.64 cm/s LV PW:         1.03 cm  LV E/e' lateral: 6.8 LV IVS:        0.83 cm  LV e' medial:    5.11 cm/s LVOT diam:     2.00 cm  LV E/e' medial:  8.9 LV SV:         59 LV SV Index:   29 LVOT Area:     3.14 cm  RIGHT VENTRICLE RV Basal diam:  3.43 cm RV S prime:     10.20 cm/s TAPSE (M-mode): 3.0 cm LEFT ATRIUM             Index       RIGHT ATRIUM           Index LA diam:        3.10 cm 1.54 cm/m  RA Area:     12.80 cm LA Vol (A2C):   41.6 ml 20.64 ml/m RA Volume:   28.40 ml  14.09 ml/m LA Vol (A4C):   31.3 ml 15.53 ml/m LA Biplane Vol: 37.2 ml 18.46 ml/m  AORTIC VALVE                   PULMONIC VALVE AV Area (Vmax):    2.53 cm    RVOT Peak grad: 2 mmHg AV Area (Vmean):   2.71 cm AV Area (VTI):     3.07 cm AV Vmax:           121.00 cm/s AV Vmean:          87.100 cm/s AV VTI:            0.194 m AV Peak Grad:      5.9 mmHg AV Mean Grad:      3.5 mmHg LVOT Vmax:         97.30 cm/s LVOT Vmean:        75.200 cm/s LVOT VTI:          0.189 m LVOT/AV VTI ratio: 0.98  AORTA Ao Root diam: 3.60 cm MITRAL VALVE               TRICUSPID VALVE MV  Area (PHT): 3.40 cm    TR Peak grad:   41.7 mmHg MV Decel Time: 223 msec    TR Vmax:        323.00 cm/s MV E velocity: 45.40 cm/s MV A velocity: 65.10 cm/s  SHUNTS MV E/A ratio:  0.70        Systemic VTI:  0.19 m                            Systemic Diam: 2.00 cm Lorine Bears MD Electronically signed by Lorine Bears MD Signature Date/Time: 12/14/2019/11:32:49 AM    Final      IMPRESSION: No pulmonary embolism.  Right-sided volume loss with subpleural architectural distortion/honeycombing. Extensive superimposed ground-glass pulmonary infiltrate. 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  Follow-up high-resolution CT examination of the chest is recommended in 3 months, after appropriate antibiotic therapy for the patient's underlying acute pneumonia. Pulmonary consultation would be helpful for further management.  Superimposed consolidation within the left upper lobe most in keeping with acute lobar pneumonia.  Moderate coronary artery calcification.   Electronically Signed   By: Helyn Numbers MD   On: 12/11/2019 15:37  Assessment and Plan: Acute exacerbation of Idiopathic pulmonary fibosis   - there is left upper lobe consolidation suggestive of acute infectious cause of current exacerbation   - will obtain an ace level, ra, in addition to  ANA comprehensive, cryptococcal antigen, aspergillus ab, fungitell   - solumedrol 20 bid>>prednisone 40   -Rocephin /Zothromax >>Levoquin 750 po daily   -discus d/c plans per response tomorrow   -wean fio2 down per response     I have personally obtained a history, examined the patient, evaluated laboratory and imaging results, formulated the assessment and plan and placed orders.  The Patient requires high complexity decision making for assessment and support, frequent evaluation and titration of therapies, application of advanced monitoring technologies and  extensive interpretation of multiple databases.   Trinetta Alemu,M.D. Pulmonary & Critical care Medicine [facility]

## 2019-12-14 NOTE — Progress Notes (Signed)
*  PRELIMINARY RESULTS* Echocardiogram 2D Echocardiogram has been performed.  Donald Sellers 12/14/2019, 9:19 AM

## 2019-12-15 DIAGNOSIS — J9601 Acute respiratory failure with hypoxia: Secondary | ICD-10-CM | POA: Diagnosis not present

## 2019-12-15 LAB — BASIC METABOLIC PANEL
Anion gap: 7 (ref 5–15)
BUN: 24 mg/dL — ABNORMAL HIGH (ref 8–23)
CO2: 32 mmol/L (ref 22–32)
Calcium: 9.1 mg/dL (ref 8.9–10.3)
Chloride: 99 mmol/L (ref 98–111)
Creatinine, Ser: 0.77 mg/dL (ref 0.61–1.24)
GFR calc Af Amer: >60 mL/min (ref >60)
GFR calc non Af Amer: >60 mL/min (ref >60)
Glucose, Bld: 97 mg/dL (ref 70–99)
Potassium: 5.1 mmol/L (ref 3.5–5.1)
Sodium: 138 mmol/L (ref 135–145)

## 2019-12-15 LAB — CBC
HCT: 41 % (ref 39.0–52.0)
Hemoglobin: 13.8 g/dL (ref 13.0–17.0)
MCH: 29.7 pg (ref 26.0–34.0)
MCHC: 33.7 g/dL (ref 30.0–36.0)
MCV: 88.4 fL (ref 80.0–100.0)
Platelets: 355 10*3/uL (ref 150–400)
RBC: 4.64 MIL/uL (ref 4.22–5.81)
RDW: 13.2 % (ref 11.5–15.5)
WBC: 14.8 10*3/uL — ABNORMAL HIGH (ref 4.0–10.5)
nRBC: 0 % (ref 0.0–0.2)

## 2019-12-15 LAB — MAGNESIUM: Magnesium: 2.2 mg/dL (ref 1.7–2.4)

## 2019-12-15 LAB — EXPECTORATED SPUTUM ASSESSMENT W GRAM STAIN, RFLX TO RESP C

## 2019-12-15 NOTE — Progress Notes (Signed)
PROGRESS NOTE    Donald Sellers  WCB:[id number] DOB: [birth year] DOA: 12/11/2019 PCP: Patient, No Pcp Per    Assessment & Plan:   Active Problems:   Acute respiratory failure with hypoxia (HCC)    Donald Sellers is a 69 y.o. Caucasian male with no significant medical history who presented with acute worsening of dyspnea.     # Acute hypoxic respiratory failure  --needed 6L on presentation.  COVID neg.   CTA neg for PE, but showed "Right-sided volume loss with subpleural architectural distortion/honeycombing. Extensive superimposed ground-glass pulmonary infiltrate" and "Superimposed consolidation within the left upper lobe."  --O2 requirement down to 4L on 7/24, pt reported breathing better. --TTE showed normal LVEF and grade I diastolic dysfunction. PLAN: --continue supplemental O2 to maintain sats >=92%, wean as able, currently 3L at rest --Treat the following conditions --Trail IV lasix 20 mg daily  # Acute exacerbation of Pulmonary fibrosis --New dx.  Per hx, likely slowly progressing and acutely worsened due to superimposed PNA --procal neg x2 --cryptococcal antigen neg PLAN: --continue supplemental O2 to maintain sats >=92%, wean as able, currently 3L at rest --Pulm consult rec   - serology for ANA comprehensive, aspergillus ab, fungitell pending   - Respiratory viral panel   - sputum bacterial cultures pending   - MRSA PCR   - continue prednisone 40 mg daily (from solumedrol 20 mg BID)  # Left upper lobe PNA --presented with leukocytosis, but no fever and procal neg.  CTA showed consolidation. --started on ceftriaxone and azithro in the ED, and switched to Levaquin on 7/24 PLAN: --continue Levaquin (day 5 of 5)  # GERD --Per pulm GERD should be tightly controlled while in acute exacerbation of IPF --continue protonix BID   DVT prophylaxis: Lovenox SQ Code Status: Full code  Family Communication:  Status is: inpatient Dispo:   The patient is from:  home Anticipated d/c is to: home Anticipated d/c date is: 1-2 days Patient currently is not medically stable to d/c due to: still on 3L supplemental O2, need pulm clearance for discharge.  Likely will need home O2.   Subjective and Interval History:  Attempt to wean O2 found pt desat with 2L on RA.  Needed at least 3 L at rest.  No new complaints.   Objective: Vitals:   12/15/19 0423 12/15/19 0801 12/15/19 1117 12/15/19 1636  BP: 104/69 111/74 115/74 (!) 121/88  Pulse: 71 82 (!) 108 101  Resp: 17 16 19 17   Temp: 97.9 F (36.6 C) 97.6 F (36.4 C) 98.3 F (36.8 C) 98.4 F (36.9 C)  TempSrc: Oral Oral Oral Oral  SpO2: 98% 98% 90% 92%  Weight:      Height:        Intake/Output Summary (Last 24 hours) at 12/15/2019 1733 Last data filed at 12/15/2019 1100 Gross per 24 hour  Intake 948 ml  Output 2650 ml  Net -1702 ml   Filed Weights   12/11/19 1335 12/11/19 2038  Weight: 81.6 kg 83.6 kg    Examination:   Constitutional: NAD, AAOx3 HEENT: conjunctivae and lids normal, EOMI CV: RRR. Distal pulses +2.  No cyanosis.   RESP: loud fine crackles over bilateral posterior bases, normal respiratory effort, on 3L GI: +BS, NTND Extremities: No effusions, edema, or tenderness in BLE SKIN: warm, dry and intact Neuro: II - XII grossly intact.  Sensation intact Psych: Normal mood and affect.  Appropriate judgement and reason     Data Reviewed: I have personally reviewed following labs  and imaging studies  CBC: Recent Labs  Lab 12/11/19 1314 12/12/19 0503 12/13/19 0620 12/14/19 0518 12/15/19 0534  WBC 18.0* 9.5 18.0* 15.1* 14.8*  NEUTROABS 14.3*  --   --   --   --   HGB 14.8 14.0 13.7 13.7 13.8  HCT 45.3 41.6 40.9 41.7 41.0  MCV 89.3 89.3 88.9 90.3 88.4  PLT 354 334 366 387 355   Basic Metabolic Panel: Recent Labs  Lab 12/11/19 1314 12/12/19 0503 12/13/19 0620 12/14/19 0518 12/15/19 0534  NA 135  --  136 139 138  K 4.0  --  5.5* 4.8 5.1  CL 99  --  100 100 99   CO2 24  --  26 31 32  GLUCOSE 115*  --  123* 97 97  BUN 10  --  22 27* 24*  CREATININE 0.85  --  0.77 0.89 0.77  CALCIUM 9.4  --  9.3 9.0 9.1  MG  --  2.2 2.3 2.2 2.2   GFR: Estimated Creatinine Clearance: 92.5 mL/min (by C-G formula based on SCr of 0.77 mg/dL). Liver Function Tests: Recent Labs  Lab 12/11/19 1314  AST 27  ALT 16  ALKPHOS 116  BILITOT 1.3*  PROT 8.9*  ALBUMIN 4.0   No results for input(s): LIPASE, AMYLASE in the last 168 hours. No results for input(s): AMMONIA in the last 168 hours. Coagulation Profile: No results for input(s): INR, PROTIME in the last 168 hours. Cardiac Enzymes: No results for input(s): CKTOTAL, CKMB, CKMBINDEX, TROPONINI in the last 168 hours. BNP (last 3 results) No results for input(s): PROBNP in the last 8760 hours. HbA1C: No results for input(s): HGBA1C in the last 72 hours. CBG: No results for input(s): GLUCAP in the last 168 hours. Lipid Profile: No results for input(s): CHOL, HDL, LDLCALC, TRIG, CHOLHDL, LDLDIRECT in the last 72 hours. Thyroid Function Tests: No results for input(s): TSH, T4TOTAL, FREET4, T3FREE, THYROIDAB in the last 72 hours. Anemia Panel: No results for input(s): VITAMINB12, FOLATE, FERRITIN, TIBC, IRON, RETICCTPCT in the last 72 hours. Sepsis Labs: Recent Labs  Lab 12/11/19 1314 12/13/19 0620  PROCALCITON <0.10 <0.10  LATICACIDVEN 1.8  --     Recent Results (from the past 240 hour(s))  Blood Culture (routine x 2)     Status: None (Preliminary result)   Collection Time: 12/11/19  1:14 PM   Specimen: BLOOD  Result Value Ref Range Status   Specimen Description BLOOD RIGHT ANTECUBITAL  Final   Special Requests   Final    BOTTLES DRAWN AEROBIC AND ANAEROBIC Blood Culture adequate volume   Culture   Final    NO GROWTH 4 DAYS Performed at [facility], [address] [place], [place] [address]    Report Status PENDING  Incomplete  SARS Coronavirus 2 by RT PCR (hospital order, performed in  [facility] hospital lab) Nasopharyngeal Nasopharyngeal Swab     Status: None   Collection Time: 12/11/19  2:02 PM   Specimen: Nasopharyngeal Swab  Result Value Ref Range Status   SARS Coronavirus 2 NEGATIVE NEGATIVE Final    Comment: (NOTE) SARS-CoV-2 target nucleic acids are NOT DETECTED.  The SARS-CoV-2 RNA is generally detectable in upper and lower respiratory specimens during the acute phase of infection. The lowest concentration of SARS-CoV-2 viral copies this assay can detect is 250 copies / mL. A negative result does not preclude SARS-CoV-2 infection and should not be used as the sole basis for treatment or other patient management decisions.  A  negative result may occur with improper specimen collection / handling, submission of specimen other than nasopharyngeal swab, presence of viral mutation(s) within the areas targeted by this assay, and inadequate number of viral copies (<250 copies / mL). A negative result must be combined with clinical observations, patient history, and epidemiological information.  Fact Sheet for Patients:   BoilerBrush.com.cy  Fact Sheet for Healthcare Providers: https://pope.com/  This test is not yet approved or  cleared by the [place] FDA and has been authorized for detection and/or diagnosis of SARS-CoV-2 by FDA under an Emergency Use Authorization (EUA).  This EUA will remain in effect (meaning this test can be used) for the duration of the COVID-19 declaration under Section 564(b)(1) of the Act, 21 U.S.C. section 360bbb-3(b)(1), unless the authorization is terminated or revoked sooner.  Performed at [facility], [address] [place], [place] [address]   Blood Culture (routine x 2)     Status: None (Preliminary result)   Collection Time: 12/11/19  2:03 PM   Specimen: BLOOD  Result Value Ref Range Status   Specimen Description BLOOD BLOOD LEFT FOREARM  Final   Special  Requests   Final    BOTTLES DRAWN AEROBIC AND ANAEROBIC Blood Culture adequate volume   Culture   Final    NO GROWTH 4 DAYS Performed at [facility], [address], [place], [place] [address]    Report Status PENDING  Incomplete  Expectorated sputum assessment w rflx to resp cult     Status: None   Collection Time: 12/11/19  8:50 PM   Specimen: Urine, Clean Catch; Sputum  Result Value Ref Range Status   Specimen Description Expect. Sput  Final   Special Requests NONE  Final   Sputum evaluation   Final    THIS SPECIMEN IS ACCEPTABLE FOR SPUTUM CULTURE Performed at [facility], [address] [place], [place] [address]    Report Status 12/12/2019 FINAL  Final  Culture, respiratory     Status: None   Collection Time: 12/11/19  8:50 PM  Result Value Ref Range Status   Specimen Description   Final    Expect. Sput Performed at [facility], [address] [place], [place] [address]    Special Requests   Final    NONE Reflexed from [id number] Performed at [facility], [address] [place], [place] [address]    Gram Stain   Final    MODERATE WBC PRESENT, PREDOMINANTLY PMN RARE SQUAMOUS EPITHELIAL CELLS PRESENT FEW GRAM POSITIVE COCCI IN PAIRS RARE GRAM POSITIVE RODS RARE GRAM NEGATIVE RODS    Culture   Final    Consistent with normal respiratory flora. Performed at [facility], [address] [place], [place] [address]    Report Status 12/14/2019 FINAL  Final  Urine culture     Status: None   Collection Time: 12/11/19  8:52 PM   Specimen: Urine, Random  Result Value Ref Range Status   Specimen Description   Final    URINE, RANDOM Performed at [facility], [address] [place], [place] [address]    Special Requests   Final    NONE Performed at [facility], [address], [place], [place] [address]    Culture   Final    NO GROWTH Performed at [facility], [address],  [place], [place] [address]    Report Status 12/13/2019 FINAL  Final  Expectorated sputum assessment w rflx to  resp cult     Status: None   Collection Time: 12/11/19  8:52 PM   Specimen: Sputum  Result Value Ref Range Status   Specimen Description SPUTUM  Final   Special Requests   Final    NONE Performed at [facility], [address] [place], [place] [address]    Report Status 12/15/2019 FINAL  Final  Respiratory Panel by RT PCR (Flu A&B, Covid) - Nasopharyngeal Swab     Status: None   Collection Time: 12/12/19 10:55 AM   Specimen: Nasopharyngeal Swab  Result Value Ref Range Status   SARS Coronavirus 2 by RT PCR NEGATIVE NEGATIVE Final    Comment: (NOTE) SARS-CoV-2 target nucleic acids are NOT DETECTED.  The SARS-CoV-2 RNA is generally detectable in upper respiratoy specimens during the acute phase of infection. The lowest concentration of SARS-CoV-2 viral copies this assay can detect is 131 copies/mL. A negative result does not preclude SARS-Cov-2 infection and should not be used as the sole basis for treatment or other patient management decisions. A negative result may occur with  improper specimen collection/handling, submission of specimen other than nasopharyngeal swab, presence of viral mutation(s) within the areas targeted by this assay, and inadequate number of viral copies (<131 copies/mL). A negative result must be combined with clinical observations, patient history, and epidemiological information. The expected result is Negative.  Fact Sheet for Patients:  https://www.moore.com/  Fact Sheet for Healthcare Providers:  https://www.young.biz/  This test is no t yet approved or cleared by the [place] FDA and  has been authorized for detection and/or diagnosis of SARS-CoV-2 by FDA under an Emergency Use Authorization (EUA). This EUA will remain  in effect (meaning this test can be used) for the duration of  the COVID-19 declaration under Section 564(b)(1) of the Act, 21 U.S.C. section 360bbb-3(b)(1), unless the authorization is terminated or revoked sooner.     Influenza A by PCR NEGATIVE NEGATIVE Final   Influenza B by PCR NEGATIVE NEGATIVE Final    Comment: (NOTE) The Xpert Xpress SARS-CoV-2/FLU/RSV assay is intended as an aid in  the diagnosis of influenza from Nasopharyngeal swab specimens and  should not be used as a sole basis for treatment. Nasal washings and  aspirates are unacceptable for Xpert Xpress SARS-CoV-2/FLU/RSV  testing.  Fact Sheet for Patients: https://www.moore.com/  Fact Sheet for Healthcare Providers: https://www.young.biz/  This test is not yet approved or cleared by the [place] FDA and  has been authorized for detection and/or diagnosis of SARS-CoV-2 by  FDA under an Emergency Use Authorization (EUA). This EUA will remain  in effect (meaning this test can be used) for the duration of the  Covid-19 declaration under Section 564(b)(1) of the Act, 21  U.S.C. section 360bbb-3(b)(1), unless the authorization is  terminated or revoked. Performed at [facility], [address] [place], [place] [address]   Aspergillus Ag, BAL/Serum     Status: None   Collection Time: 12/14/19  5:18 AM   Specimen: Vein  Result Value Ref Range Status   Aspergillus Ag, BAL/Serum 0.11 0.00 - 0.49 Index Final    Comment: (NOTE) Performed At: [facility] [address] [place], [place] [id number] Jolene Schimke MD ER:[id number]       Radiology Studies: ECHOCARDIOGRAM COMPLETE  Result Date: 12/14/2019    ECHOCARDIOGRAM REPORT   Patient Name:   NELSON NOONE Date of Exam: 12/14/2019 Medical Rec #:  [id number]     Height:       70.0 in  Accession #:    [id number]    Weight:       184.2 lb Date of Birth:  11-Feb-1951    BSA:          2.016 m Patient Age:    68 years      BP:           101/73 mmHg Patient Gender: M              HR:           85 bpm. Exam Location:  [facility] Procedure: 2D Echo, Cardiac Doppler and Color Doppler Indications:     CHF-Acute diastolic 428.31  History:         Patient has no prior history of Echocardiogram examinations.                  CHF; COPD.  Sonographer:     Cristela Blue RDCS (AE) Referring Phys:  [id number] Vida Rigger Diagnosing Phys: Lorine Bears MD IMPRESSIONS  1. Left ventricular ejection fraction, by estimation, is 55 to 60%. The left ventricle has normal function. The left ventricle has no regional wall motion abnormalities. There is mild left ventricular hypertrophy. Left ventricular diastolic parameters are consistent with Grade I diastolic dysfunction (impaired relaxation).  2. Right ventricular systolic function is normal. The right ventricular size is normal. There is moderately elevated pulmonary artery systolic pressure. The estimated right ventricular systolic pressure is 49.7 mmHg.  3. The mitral valve is normal in structure. No evidence of mitral valve regurgitation. No evidence of mitral stenosis.  4. The aortic valve is normal in structure. Aortic valve regurgitation is not visualized. No aortic stenosis is present. FINDINGS  Left Ventricle: Left ventricular ejection fraction, by estimation, is 55 to 60%. The left ventricle has normal function. The left ventricle has no regional wall motion abnormalities. The left ventricular internal cavity size was normal in size. There is  mild left ventricular hypertrophy. Left ventricular diastolic parameters are consistent with Grade I diastolic dysfunction (impaired relaxation). Right Ventricle: The right ventricular size is normal. No increase in right ventricular wall thickness. Right ventricular systolic function is normal. There is moderately elevated pulmonary artery systolic pressure. The tricuspid regurgitant velocity is 3.23 m/s, and with an assumed right atrial pressure of 8 mmHg, the estimated right ventricular systolic pressure is 49.7  mmHg. Left Atrium: Left atrial size was normal in size. Right Atrium: Right atrial size was normal in size. Pericardium: There is no evidence of pericardial effusion. Mitral Valve: The mitral valve is normal in structure. Normal mobility of the mitral valve leaflets. No evidence of mitral valve regurgitation. No evidence of mitral valve stenosis. Tricuspid Valve: The tricuspid valve is normal in structure. Tricuspid valve regurgitation is mild . No evidence of tricuspid stenosis. Aortic Valve: The aortic valve is normal in structure. Aortic valve regurgitation is not visualized. No aortic stenosis is present. Aortic valve mean gradient measures 3.5 mmHg. Aortic valve peak gradient measures 5.9 mmHg. Aortic valve area, by VTI measures 3.07 cm. Pulmonic Valve: The pulmonic valve was normal in structure. Pulmonic valve regurgitation is not visualized. No evidence of pulmonic stenosis. Aorta: The aortic root is normal in size and structure. Venous: The inferior vena cava was not well visualized. IAS/Shunts: No atrial level shunt detected by color flow Doppler.  LEFT VENTRICLE PLAX 2D LVIDd:         4.24 cm  Diastology LVIDs:         2.72 cm  LV e'  lateral:   6.64 cm/s LV PW:         1.03 cm  LV E/e' lateral: 6.8 LV IVS:        0.83 cm  LV e' medial:    5.11 cm/s LVOT diam:     2.00 cm  LV E/e' medial:  8.9 LV SV:         59 LV SV Index:   29 LVOT Area:     3.14 cm  RIGHT VENTRICLE RV Basal diam:  3.43 cm RV S prime:     10.20 cm/s TAPSE (M-mode): 3.0 cm LEFT ATRIUM             Index       RIGHT ATRIUM           Index LA diam:        3.10 cm 1.54 cm/m  RA Area:     12.80 cm LA Vol (A2C):   41.6 ml 20.64 ml/m RA Volume:   28.40 ml  14.09 ml/m LA Vol (A4C):   31.3 ml 15.53 ml/m LA Biplane Vol: 37.2 ml 18.46 ml/m  AORTIC VALVE                   PULMONIC VALVE AV Area (Vmax):    2.53 cm    RVOT Peak grad: 2 mmHg AV Area (Vmean):   2.71 cm AV Area (VTI):     3.07 cm AV Vmax:           121.00 cm/s AV Vmean:           87.100 cm/s AV VTI:            0.194 m AV Peak Grad:      5.9 mmHg AV Mean Grad:      3.5 mmHg LVOT Vmax:         97.30 cm/s LVOT Vmean:        75.200 cm/s LVOT VTI:          0.189 m LVOT/AV VTI ratio: 0.98  AORTA Ao Root diam: 3.60 cm MITRAL VALVE               TRICUSPID VALVE MV Area (PHT): 3.40 cm    TR Peak grad:   41.7 mmHg MV Decel Time: 223 msec    TR Vmax:        323.00 cm/s MV E velocity: 45.40 cm/s MV A velocity: 65.10 cm/s  SHUNTS MV E/A ratio:  0.70        Systemic VTI:  0.19 m                            Systemic Diam: 2.00 cm Lorine Bears MD Electronically signed by Lorine Bears MD Signature Date/Time: 12/14/2019/11:32:49 AM    Final      Scheduled Meds: . ALPRAZolam  0.5 mg Oral QHS  . enoxaparin (LOVENOX) injection  40 mg Subcutaneous Q24H  . furosemide  20 mg Intravenous Daily  . levofloxacin  750 mg Oral Daily  . magnesium hydroxide  30 mL Oral Daily  . metoprolol tartrate  12.5 mg Oral BID  . pantoprazole  40 mg Oral BID  . pneumococcal 23 valent vaccine  0.5 mL Intramuscular Tomorrow-1000  . predniSONE  40 mg Oral Q breakfast   Continuous Infusions: . sodium chloride Stopped (12/12/19 1432)     LOS: 4 days     Darlin Priestly, MD [facility] If  7PM-7AM, please contact night-coverage 12/15/2019, 5:33 PM

## 2019-12-15 NOTE — Progress Notes (Signed)
Date: 12/15/2019,   MRN# [id number] Donald Sellers Jun 15, 1950 Code Status:     Code Status Orders  (From admission, onward)         Start     Ordered   12/11/19 2015  Full code  Continuous        12/11/19 2015        Code Status History    This patient has a current code status but no historical code status.   Advance Care Planning Activity       HPI: SLOWLY IMPROVING, FIO2 DOWN SOME. STILL ANXIOUS. See last notes  PMHX:   Past Medical History:  Diagnosis Date  . Patient denies medical problems    Surgical Hx:  Past Surgical History:  Procedure Laterality Date  . EYE SURGERY     Family Hx:  Family History  Problem Relation Age of Onset  . Pulmonary fibrosis Neg Hx    Social Hx:   Social History   Tobacco Use  . Smoking status: Former Smoker    Packs/day: 1.00    Years: 11.00    Pack years: 11.00    Types: Cigarettes    Quit date: 1980    Years since quitting: 41.5  . Smokeless tobacco: Never Used  Substance Use Topics  . Alcohol use: Yes  . Drug use: Never   Medication:    Home Medication:    Current Medication: @CURMEDTAB @   Allergies:  Patient has no known allergies.  Review of Systems: Gen:  Denies  fever, sweats, chills HEENT: Denies blurred vision, double vision, ear pain, eye pain, hearing loss, nose bleeds, sore throat Cvc:  No dizziness, chest pain or heaviness Resp: less dyspnea   Gi: Denies swallowing difficulty, stomach pain, nausea or vomiting, diarrhea, constipation, bowel incontinence Gu:  Denies bladder incontinence, burning urine Ext:   No Joint pain, stiffness or swelling Skin: No skin rash, easy bruising or bleeding or hives Endoc:  No polyuria, polydipsia , polyphagia or weight change Psych: No depression, insomnia or hallucinations  Other:  All other systems negative  Physical Examination:   VS: BP (!) 121/88 (BP Location: Left Arm)   Pulse 101   Temp 98.4 F (36.9 C) (Oral)   Resp 17   Ht 5\' 10"  (1.778 m)   Wt  83.6 kg   SpO2 92%   BMI 26.43 kg/m   General Appearance: mild distress  Neuro: without focal findings, mental status, speech normal, alert and oriented, cranial nerves 2-12 intact, reflexes normal and symmetric, sensation grossly normal  HEENT: PERRLA, EOM intact, no ptosis, no other lesions noticed, Mallampati: Pulmonary:.No wheezing, No rales  + crackles   Cardiovascular:  Normal S1,S2.  No m/r/g.   Abdomen:Benign, Soft, non-tender, No masses, hepatosplenomegaly, No lymphadenopathy Endoc: No evident thyromegaly, no signs of acromegaly or Cushing features Skin:   warm, no rashes, no ecchymosis  Extremities: normal, no cyanosis, clubbing, no edema, warm with normal capillary refill. Other findings:   Labs results:   Recent Labs    12/13/19 0620 12/14/19 0518 12/15/19 0534  HGB 13.7 13.7 13.8  HCT 40.9 41.7 41.0  MCV 88.9 90.3 88.4  WBC 18.0* 15.1* 14.8*  BUN 22 27* 24*  CREATININE 0.77 0.89 0.77  GLUCOSE 123* 97 97  CALCIUM 9.3 9.0 9.1  ,    Other:   Assessment and Plan: Acute exacerbation of Idiopathic pulmonary fibosis,  there is left upper lobe consolidation suggestive of acute infectious cause of current exacerbation. Fio2 is down to 2  liters sats 90-94 % while in the room.  -prednisone 40 -Levoquin 750 po daily   -cxr in am   -wean fio2 down per response   -home possible in 1-2 days with close out patient f/u with Dr. Darci Needle  I have personally obtained a history, examined the patient, evaluated laboratory and imaging results, formulated the assessment and plan and placed orders.  The Patient requires high complexity decision making for assessment and support, frequent evaluation and titration of therapies, application of advanced monitoring technologies and extensive interpretation of multiple databases.   Cambrie Sonnenfeld,M.D. Pulmonary & Critical care Medicine [facility]

## 2019-12-16 ENCOUNTER — Inpatient Hospital Stay: Payer: BC Managed Care – PPO

## 2019-12-16 DIAGNOSIS — J9601 Acute respiratory failure with hypoxia: Secondary | ICD-10-CM | POA: Diagnosis not present

## 2019-12-16 LAB — BASIC METABOLIC PANEL
Anion gap: 9 (ref 5–15)
BUN: 22 mg/dL (ref 8–23)
CO2: 30 mmol/L (ref 22–32)
Calcium: 9.3 mg/dL (ref 8.9–10.3)
Chloride: 97 mmol/L — ABNORMAL LOW (ref 98–111)
Creatinine, Ser: 0.85 mg/dL (ref 0.61–1.24)
GFR calc Af Amer: >60 mL/min (ref >60)
GFR calc non Af Amer: >60 mL/min (ref >60)
Glucose, Bld: 105 mg/dL — ABNORMAL HIGH (ref 70–99)
Potassium: 5.7 mmol/L — ABNORMAL HIGH (ref 3.5–5.1)
Sodium: 136 mmol/L (ref 135–145)

## 2019-12-16 LAB — CBC
HCT: 42.9 % (ref 39.0–52.0)
Hemoglobin: 14.3 g/dL (ref 13.0–17.0)
MCH: 29.5 pg (ref 26.0–34.0)
MCHC: 33.3 g/dL (ref 30.0–36.0)
MCV: 88.6 fL (ref 80.0–100.0)
Platelets: 390 10*3/uL (ref 150–400)
RBC: 4.84 MIL/uL (ref 4.22–5.81)
RDW: 13.2 % (ref 11.5–15.5)
WBC: 13.8 10*3/uL — ABNORMAL HIGH (ref 4.0–10.5)
nRBC: 0 % (ref 0.0–0.2)

## 2019-12-16 LAB — ASPERGILLUS ANTIGEN, BAL/SERUM: Aspergillus Ag, BAL/Serum: 0.11 Index (ref 0.00–0.49)

## 2019-12-16 LAB — CULTURE, BLOOD (ROUTINE X 2)
Culture: NO GROWTH
Culture: NO GROWTH
Special Requests: ADEQUATE
Special Requests: ADEQUATE

## 2019-12-16 LAB — RHEUMATOID FACTOR: Rheumatoid fact SerPl-aCnc: <10 IU/mL (ref 0.0–13.9)

## 2019-12-16 LAB — MAGNESIUM: Magnesium: 2.5 mg/dL — ABNORMAL HIGH (ref 1.7–2.4)

## 2019-12-16 LAB — ANGIOTENSIN CONVERTING ENZYME: Angiotensin-Converting Enzyme: 34 U/L (ref 14–82)

## 2019-12-16 MED ORDER — SODIUM ZIRCONIUM CYCLOSILICATE 5 G PO PACK
5.0000 g | PACK | Freq: Three times a day (TID) | ORAL | Status: DC
  Administered 2019-12-16 (×2): 5 g via ORAL
  Filled 2019-12-16 (×6): qty 1

## 2019-12-16 MED ORDER — LEVOFLOXACIN 750 MG PO TABS
750.0000 mg | ORAL_TABLET | Freq: Every day | ORAL | Status: DC
  Administered 2019-12-16 – 2019-12-17 (×2): 750 mg via ORAL
  Filled 2019-12-16 (×2): qty 1

## 2019-12-16 NOTE — Progress Notes (Signed)
PROGRESS NOTE    Donald Sellers  ZTI:[id number] DOB: 1950-09-11 DOA: 12/11/2019 PCP: Patient, No Pcp Per    Assessment & Plan:   Active Problems:   Acute respiratory failure with hypoxia (HCC)    Donald Sellers is a 69 y.o. Caucasian male with no significant medical history who presented with acute worsening of dyspnea.     # Acute hypoxic respiratory failure  --needed 6L on presentation.  COVID neg.   CTA neg for PE, but showed "Right-sided volume loss with subpleural architectural distortion/honeycombing. Extensive superimposed ground-glass pulmonary infiltrate" and "Superimposed consolidation within the left upper lobe."  --O2 requirement down to 4L on 7/24, pt reported breathing better. --TTE showed normal LVEF and grade I diastolic dysfunction. PLAN: --continue supplemental O2 to maintain sats >=92%, wean as able, currently 3L at rest and 5L with walking --Treat the following conditions --IV lasix 20 mg daily  # Acute exacerbation of Pulmonary fibrosis --New dx.  Per hx, likely slowly progressing and acutely worsened due to superimposed PNA --procal neg x2 --cryptococcal antigen neg PLAN: --continue supplemental O2 to maintain sats >=92%, wean as able, currently 3L at rest and 5L with walking --Pulm consult rec   - serology for ANA comprehensive, aspergillus ab, fungitell pending   - Respiratory viral panel   - sputum bacterial cultures pending   - MRSA PCR   - continue prednisone 40 mg daily (from solumedrol 20 mg BID) until outpatient pulm followup  # Left upper lobe PNA --presented with leukocytosis, but no fever and procal neg.  CTA showed consolidation. --started on ceftriaxone and azithro in the ED, and switched to Levaquin on 7/24 PLAN: --continue Levaquin for a total of 10-day course, per pulm  # GERD --Per pulm GERD should be tightly controlled while in acute exacerbation of IPF --continue protonix BID   DVT prophylaxis: Lovenox SQ Code Status: Full  code  Family Communication:  Status is: inpatient Dispo:   The patient is from: home Anticipated d/c is to: home Anticipated d/c date is: tomorrow Patient currently is medically stable to d/c.  Pulm cleared pt for discharge tomorrow.   Subjective and Interval History:  Needed up to 5L while walking, desat easily with exertion.     Objective: Vitals:   12/15/19 2004 12/16/19 0018 12/16/19 0448 12/16/19 0757  BP: 120/80 105/73 111/78 111/74  Pulse: 95 76 84 80  Resp:      Temp: 97.7 F (36.5 C) 97.8 F (36.6 C) (!) 97.5 F (36.4 C) 97.8 F (36.6 C)  TempSrc: Oral Oral Oral Oral  SpO2: 93% 96% 97% 96%  Weight:      Height:        Intake/Output Summary (Last 24 hours) at 12/16/2019 1941 Last data filed at 12/16/2019 1835 Gross per 24 hour  Intake 720 ml  Output 2300 ml  Net -1580 ml   Filed Weights   12/11/19 1335 12/11/19 2038  Weight: 81.6 kg 83.6 kg    Examination:   Constitutional: NAD, AAOx3 HEENT: conjunctivae and lids normal, EOMI CV: RRR. Distal pulses +2.  No cyanosis.   RESP: loud fine crackles over bilateral posterior bases, 4L GI: +BS, NTND Extremities: No effusions, edema, or tenderness in BLE SKIN: warm, dry and intact Neuro: II - XII grossly intact.  Sensation intact Psych: Normal mood and affect.  Appropriate judgement and reason   Data Reviewed: I have personally reviewed following labs and imaging studies  CBC: Recent Labs  Lab 12/11/19 1314 12/11/19 1314 12/12/19 0503 12/13/19  [id number] 12/14/19 0518 12/15/19 0534 12/16/19 0510  WBC 18.0*   < > 9.5 18.0* 15.1* 14.8* 13.8*  NEUTROABS 14.3*  --   --   --   --   --   --   HGB 14.8   < > 14.0 13.7 13.7 13.8 14.3  HCT 45.3   < > 41.6 40.9 41.7 41.0 42.9  MCV 89.3   < > 89.3 88.9 90.3 88.4 88.6  PLT 354   < > 334 366 387 355 390   < > = values in this interval not displayed.   Basic Metabolic Panel: Recent Labs  Lab 12/11/19 1314 12/12/19 0503 12/13/19 0620 12/14/19 0518  12/15/19 0534 12/16/19 0510  NA 135  --  136 139 138 136  K 4.0  --  5.5* 4.8 5.1 5.7*  CL 99  --  100 100 99 97*  CO2 24  --  26 31 32 30  GLUCOSE 115*  --  123* 97 97 105*  BUN 10  --  22 27* 24* 22  CREATININE 0.85  --  0.77 0.89 0.77 0.85  CALCIUM 9.4  --  9.3 9.0 9.1 9.3  MG  --  2.2 2.3 2.2 2.2 2.5*   GFR: Estimated Creatinine Clearance: 87.1 mL/min (by C-G formula based on SCr of 0.85 mg/dL). Liver Function Tests: Recent Labs  Lab 12/11/19 1314  AST 27  ALT 16  ALKPHOS 116  BILITOT 1.3*  PROT 8.9*  ALBUMIN 4.0   No results for input(s): LIPASE, AMYLASE in the last 168 hours. No results for input(s): AMMONIA in the last 168 hours. Coagulation Profile: No results for input(s): INR, PROTIME in the last 168 hours. Cardiac Enzymes: No results for input(s): CKTOTAL, CKMB, CKMBINDEX, TROPONINI in the last 168 hours. BNP (last 3 results) No results for input(s): PROBNP in the last 8760 hours. HbA1C: No results for input(s): HGBA1C in the last 72 hours. CBG: No results for input(s): GLUCAP in the last 168 hours. Lipid Profile: No results for input(s): CHOL, HDL, LDLCALC, TRIG, CHOLHDL, LDLDIRECT in the last 72 hours. Thyroid Function Tests: No results for input(s): TSH, T4TOTAL, FREET4, T3FREE, THYROIDAB in the last 72 hours. Anemia Panel: No results for input(s): VITAMINB12, FOLATE, FERRITIN, TIBC, IRON, RETICCTPCT in the last 72 hours. Sepsis Labs: Recent Labs  Lab 12/11/19 1314 12/13/19 0620  PROCALCITON <0.10 <0.10  LATICACIDVEN 1.8  --     Recent Results (from the past 240 hour(s))  Blood Culture (routine x 2)     Status: None   Collection Time: 12/11/19  1:14 PM   Specimen: BLOOD  Result Value Ref Range Status   Specimen Description BLOOD RIGHT ANTECUBITAL  Final   Special Requests   Final    BOTTLES DRAWN AEROBIC AND ANAEROBIC Blood Culture adequate volume   Culture   Final    NO GROWTH 5 DAYS Performed at [facility], [address] [place], [place] [id number]    Report Status 12/16/2019 FINAL  Final  SARS Coronavirus 2 by RT PCR (hospital order, performed in [facility] hospital lab) Nasopharyngeal Nasopharyngeal Swab     Status: None   Collection Time: 12/11/19  2:02 PM   Specimen: Nasopharyngeal Swab  Result Value Ref Range Status   SARS Coronavirus 2 NEGATIVE NEGATIVE Final    Comment: (NOTE) SARS-CoV-2 target nucleic acids are NOT DETECTED.  The SARS-CoV-2 RNA is generally detectable in upper and lower respiratory specimens during the acute phase of infection. The lowest  concentration of SARS-CoV-2 viral copies this assay can detect is 250 copies / mL. A negative result does not preclude SARS-CoV-2 infection and should not be used as the sole basis for treatment or other patient management decisions.  A negative result may occur with improper specimen collection / handling, submission of specimen other than nasopharyngeal swab, presence of viral mutation(s) within the areas targeted by this assay, and inadequate number of viral copies (<250 copies / mL). A negative result must be combined with clinical observations, patient history, and epidemiological information.  Fact Sheet for Patients:   BoilerBrush.com.cy  Fact Sheet for Healthcare Providers: https://pope.com/  This test is not yet approved or  cleared by the [place] FDA and has been authorized for detection and/or diagnosis of SARS-CoV-2 by FDA under an Emergency Use Authorization (EUA).  This EUA will remain in effect (meaning this test can be used) for the duration of the COVID-19 declaration under Section 564(b)(1) of the Act, 21 U.S.C. section 360bbb-3(b)(1), unless the authorization is terminated or revoked sooner.  Performed at [facility], [address] [place], [place] [address]   Blood Culture (routine x 2)     Status: None   Collection Time: 12/11/19  2:03 PM    Specimen: BLOOD  Result Value Ref Range Status   Specimen Description BLOOD BLOOD LEFT FOREARM  Final   Special Requests   Final    BOTTLES DRAWN AEROBIC AND ANAEROBIC Blood Culture adequate volume   Culture   Final    NO GROWTH 5 DAYS Performed at [facility], [address], [place], [place] [address]    Report Status 12/16/2019 FINAL  Final  Expectorated sputum assessment w rflx to resp cult     Status: None   Collection Time: 12/11/19  8:50 PM   Specimen: Urine, Clean Catch; Sputum  Result Value Ref Range Status   Specimen Description Expect. Sput  Final   Special Requests NONE  Final   Sputum evaluation   Final    THIS SPECIMEN IS ACCEPTABLE FOR SPUTUM CULTURE Performed at [facility], [address] [place] [place], [place] [address]    Report Status 12/12/2019 FINAL  Final  Culture, respiratory     Status: None   Collection Time: 12/11/19  8:50 PM  Result Value Ref Range Status   Specimen Description   Final    Expect. Sput Performed at [facility], [address] [place], [place] [address]    Special Requests   Final    NONE Reflexed from [id number] Performed at [facility], [address] [place], [place] [address]    Gram Stain   Final    MODERATE WBC PRESENT, PREDOMINANTLY PMN RARE SQUAMOUS EPITHELIAL CELLS PRESENT FEW GRAM POSITIVE COCCI IN PAIRS RARE GRAM POSITIVE RODS RARE GRAM NEGATIVE RODS    Culture   Final    Consistent with normal respiratory flora. Performed at [facility], [address], [place], [place] [address]    Report Status 12/14/2019 FINAL  Final  Urine culture     Status: None   Collection Time: 12/11/19  8:52 PM   Specimen: Urine, Random  Result Value Ref Range Status   Specimen Description   Final    URINE, RANDOM Performed at [facility], [address], [place], [place] [address]    Special Requests   Final    NONE Performed at [facility], [address]  [place], [place] [address]  Culture   Final    NO GROWTH Performed at [facility], [address], [place], [place] [address]    Report Status 12/13/2019 FINAL  Final  Expectorated sputum assessment w rflx to resp cult     Status: None   Collection Time: 12/11/19  8:52 PM   Specimen: Sputum  Result Value Ref Range Status   Specimen Description SPUTUM  Final   Special Requests   Final    NONE Performed at [facility], [address] [place], [place] [address]    Report Status 12/15/2019 FINAL  Final  Respiratory Panel by RT PCR (Flu A&B, Covid) - Nasopharyngeal Swab     Status: None   Collection Time: 12/12/19 10:55 AM   Specimen: Nasopharyngeal Swab  Result Value Ref Range Status   SARS Coronavirus 2 by RT PCR NEGATIVE NEGATIVE Final    Comment: (NOTE) SARS-CoV-2 target nucleic acids are NOT DETECTED.  The SARS-CoV-2 RNA is generally detectable in upper respiratoy specimens during the acute phase of infection. The lowest concentration of SARS-CoV-2 viral copies this assay can detect is 131 copies/mL. A negative result does not preclude SARS-Cov-2 infection and should not be used as the sole basis for treatment or other patient management decisions. A negative result may occur with  improper specimen collection/handling, submission of specimen other than nasopharyngeal swab, presence of viral mutation(s) within the areas targeted by this assay, and inadequate number of viral copies (<131 copies/mL). A negative result must be combined with clinical observations, patient history, and epidemiological information. The expected result is Negative.  Fact Sheet for Patients:  https://www.moore.com/  Fact Sheet for Healthcare Providers:  https://www.young.biz/  This test is no t yet approved or cleared by the [place] FDA and  has been authorized for detection and/or diagnosis of SARS-CoV-2 by FDA under an Emergency Use  Authorization (EUA). This EUA will remain  in effect (meaning this test can be used) for the duration of the COVID-19 declaration under Section 564(b)(1) of the Act, 21 U.S.C. section 360bbb-3(b)(1), unless the authorization is terminated or revoked sooner.     Influenza A by PCR NEGATIVE NEGATIVE Final   Influenza B by PCR NEGATIVE NEGATIVE Final    Comment: (NOTE) The Xpert Xpress SARS-CoV-2/FLU/RSV assay is intended as an aid in  the diagnosis of influenza from Nasopharyngeal swab specimens and  should not be used as a sole basis for treatment. Nasal washings and  aspirates are unacceptable for Xpert Xpress SARS-CoV-2/FLU/RSV  testing.  Fact Sheet for Patients: https://www.moore.com/  Fact Sheet for Healthcare Providers: https://www.young.biz/  This test is not yet approved or cleared by the [place] FDA and  has been authorized for detection and/or diagnosis of SARS-CoV-2 by  FDA under an Emergency Use Authorization (EUA). This EUA will remain  in effect (meaning this test can be used) for the duration of the  Covid-19 declaration under Section 564(b)(1) of the Act, 21  U.S.C. section 360bbb-3(b)(1), unless the authorization is  terminated or revoked. Performed at [facility], [address] [place], [place] [address]   Aspergillus Ag, BAL/Serum     Status: None   Collection Time: 12/14/19  5:18 AM   Specimen: Vein  Result Value Ref Range Status   Aspergillus Ag, BAL/Serum 0.11 0.00 - 0.49 Index Final    Comment: (NOTE) Performed At: [facility] [address] [place], [place] [id number] Jolene Schimke MD DQ:[id number]       Radiology Studies: [facility] Chest [facility]  1 View  Result Date: 12/16/2019 CLINICAL DATA:  Shortness of breath EXAM: PORTABLE CHEST 1 VIEW COMPARISON:  Chest radiograph and chest CT December 11, 2019 FINDINGS: There is extensive underlying fibrosis. There are areas of patchy airspace opacity  superimposed on these fibrotic areas, essentially stable on the right. There has been slight clearing of opacity from the left upper lobe. No new opacity evident. Heart is upper normal in size with pulmonary vascularity normal. No adenopathy. No bone lesions. IMPRESSION: Widespread fibrotic type change. Superimposed pneumonia overlying areas of fibrosis difficult to exclude. There has been slight clearing from the left upper lobe compared to recent study suggesting partial clearing of pneumonia from this region. No new opacity is evident. Stable cardiac silhouette. Electronically Signed   By: Bretta Bang III M.D.   On: 12/16/2019 07:32     Scheduled Meds: . ALPRAZolam  0.5 mg Oral QHS  . enoxaparin (LOVENOX) injection  40 mg Subcutaneous Q24H  . furosemide  20 mg Intravenous Daily  . magnesium hydroxide  30 mL Oral Daily  . metoprolol tartrate  12.5 mg Oral BID  . pantoprazole  40 mg Oral BID  . pneumococcal 23 valent vaccine  0.5 mL Intramuscular Tomorrow-1000  . predniSONE  40 mg Oral Q breakfast  . sodium zirconium cyclosilicate  5 g Oral TID   Continuous Infusions: . sodium chloride Stopped (12/12/19 1432)     LOS: 5 days     Darlin Priestly, MD [facility] If 7PM-7AM, please contact night-coverage 12/16/2019, 7:41 PM

## 2019-12-16 NOTE — Progress Notes (Signed)
Date: 12/16/2019,   MRN# [id number] Donald Sellers 04-22-1951 Code Status:     Code Status Orders  (From admission, onward)         Start     Ordered   12/11/19 2015  Full code  Continuous        12/11/19 2015        Code Status History    This patient has a current code status but no historical code status.   Advance Care Planning Activity           HPI: Today, stronger, speaking full sentences, no chest pain, calf pain, wheezing, ++ cough, no significant sputum. cxr showed pneumonia showing signs of clearing on present regimen. STILL NEEDING OXYGEN.  PMHX:   Past Medical History:  Diagnosis Date  . Patient denies medical problems    Surgical Hx:  Past Surgical History:  Procedure Laterality Date  . EYE SURGERY     Family Hx:  Family History  Problem Relation Age of Onset  . Pulmonary fibrosis Neg Hx    Social Hx:   Social History   Tobacco Use  . Smoking status: Former Smoker    Packs/day: 1.00    Years: 11.00    Pack years: 11.00    Types: Cigarettes    Quit date: 1980    Years since quitting: 41.6  . Smokeless tobacco: Never Used  Substance Use Topics  . Alcohol use: Yes  . Drug use: Never   Medication:    Home Medication:    Current Medication: @CURMEDTAB @   Allergies:  Patient has no known allergies.  Review of Systems: Gen:  Denies  fever, sweats, chills HEENT: Denies blurred vision, double vision, ear pain, eye pain, hearing loss, nose bleeds, sore throat Cvc:  No dizziness, chest pain or heaviness Resp: sob no worse, no pleurisy, sense he is slowly improving  Gi: Denies swallowing difficulty, stomach pain, nausea or vomiting, diarrhea, constipation, bowel incontinence Gu:  Denies bladder incontinence, burning urine Ext:   No Joint pain, stiffness or swelling Skin: No skin rash, easy bruising or bleeding or hives Endoc:  No polyuria, polydipsia , polyphagia or weight change Psych: No depression, insomnia or hallucinations  Other:   All other systems negative  Physical Examination:   VS: BP 111/74 (BP Location: Left Arm)   Pulse 80   Temp 97.8 F (36.6 C) (Oral)   Resp 17   Ht 5\' 10"  (1.778 m)   Wt 83.6 kg   SpO2 96%   BMI 26.43 kg/m   General Appearance: No distress, [place] 02 on at 2 liters  Neuro: without focal findings, mental status, speech normal, alert and oriented, cranial nerves 2-12 intact, reflexes normal and symmetric, sensation grossly normal  HEENT: PERRLA, EOM intact, no ptosis, no other lesions noticed, Pulmonary:.No wheezing, No rales  +crackles  Cardiovascular:  Normal S1,S2.  No m/r/g.   ABD: Non tender.  No masses  Abdomen:Benign, Soft, non-tender, No masses, hepatosplenomegaly, No lymphadenopathy Endoc: No evident thyromegaly, no signs of acromegaly or Cushing features Skin:   warm, no rashes, no ecchymosis  Extremities: normal, no cyanosis, clubbing, no edema, warm with normal capillary refill.    Labs results:   Recent Labs    12/14/19 0518 12/15/19 0534 12/16/19 0510  HGB 13.7 13.8 14.3  HCT 41.7 41.0 42.9  MCV 90.3 88.4 88.6  WBC 15.1* 14.8* 13.8*  BUN 27* 24* 22  CREATININE 0.89 0.77 0.85  GLUCOSE 97 97 105*  CALCIUM 9.0 9.1  9.3  ,    Rad results:   DG Chest Port 1 View  Result Date: 12/16/2019 CLINICAL DATA:  Shortness of breath EXAM: PORTABLE CHEST 1 VIEW COMPARISON:  Chest radiograph and chest CT December 11, 2019 FINDINGS: There is extensive underlying fibrosis. There are areas of patchy airspace opacity superimposed on these fibrotic areas, essentially stable on the right. There has been slight clearing of opacity from the left upper lobe. No new opacity evident. Heart is upper normal in size with pulmonary vascularity normal. No adenopathy. No bone lesions. IMPRESSION: Widespread fibrotic type change. Superimposed pneumonia overlying areas of fibrosis difficult to exclude. There has been slight clearing from the left upper lobe compared to recent study suggesting partial  clearing of pneumonia from this region. No new opacity is evident. Stable cardiac silhouette. Electronically Signed   By: Bretta Bang III M.D.   On: 12/16/2019 07:32   IMPRESSION: No pulmonary embolism.  Right-sided volume loss with subpleural architectural distortion/honeycombing. Extensive superimposed ground-glass pulmonary infiltrate. 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 Follow-up high-resolution CT examination of the chest is recommended in 3 months, after appropriate antibiotic therapy for the patient's underlying acute pneumonia. Pulmonary consultation would be helpful for further management.  Superimposed consolidation within the left upper lobe most in keeping with acute lobar pneumonia.  Moderate coronary artery calcification.   Electronically Signed   By: Helyn Numbers MD   On: 12/11/2019 15:37  ACE nl, ra nl,     Assessment and Plan: Acute exacerbation of Idiopathic pulmonary fibosis,  presently looks like NSIP, better prognosis vs UIP. There is left upper lobe consolidation suggestive of acute infectious cause of current exacerbation. Today's cxr showed partial LUL clearing. Seem the pneumonia is respondingis down to 2 liters sats 90-94 % while in the room.  -prednisone 40 until he sees [facility] in one week at [facility] -Levoquin 750 po daily x five days   -fio2 down at two liters at rest and 5 liters with ambulation   -Incentive spirometry q 4 hrs   -repeat chset in 3 months   -out pft pft when pneumonia is resolved   -home todayor am if his oxygen can be arranged  I have personally obtained a history, examined the patient, evaluated laboratory and imaging results, formulated the assessment and plan and placed orders.  The Patient requires high complexity decision making for assessment and support, frequent evaluation and titration of therapies, application of advanced  monitoring technologies and extensive interpretation of multiple databases.   Albertine Lafoy,M.D. Pulmonary & Critical care Medicine [facility]    I have personally obtained a history, examined the patient, evaluated laboratory and imaging results, formulated the assessment and plan and placed orders.  The Patient requires high complexity decision making for assessment and support, frequent evaluation and titration of therapies, application of advanced monitoring technologies and extensive interpretation of multiple databases.   Antionetta Ator,M.D. Pulmonary & Critical care Medicine [facility]

## 2019-12-17 LAB — CBC
HCT: 42 % (ref 39.0–52.0)
Hemoglobin: 13.7 g/dL (ref 13.0–17.0)
MCH: 29.6 pg (ref 26.0–34.0)
MCHC: 32.6 g/dL (ref 30.0–36.0)
MCV: 90.7 fL (ref 80.0–100.0)
Platelets: 396 10*3/uL (ref 150–400)
RBC: 4.63 MIL/uL (ref 4.22–5.81)
RDW: 13.1 % (ref 11.5–15.5)
WBC: 14.3 10*3/uL — ABNORMAL HIGH (ref 4.0–10.5)
nRBC: 0 % (ref 0.0–0.2)

## 2019-12-17 LAB — BASIC METABOLIC PANEL
Anion gap: 9 (ref 5–15)
BUN: 24 mg/dL — ABNORMAL HIGH (ref 8–23)
CO2: 31 mmol/L (ref 22–32)
Calcium: 8.8 mg/dL — ABNORMAL LOW (ref 8.9–10.3)
Chloride: 96 mmol/L — ABNORMAL LOW (ref 98–111)
Creatinine, Ser: 0.77 mg/dL (ref 0.61–1.24)
GFR calc Af Amer: >60 mL/min (ref >60)
GFR calc non Af Amer: >60 mL/min (ref >60)
Glucose, Bld: 90 mg/dL (ref 70–99)
Potassium: 5.2 mmol/L — ABNORMAL HIGH (ref 3.5–5.1)
Sodium: 136 mmol/L (ref 135–145)

## 2019-12-17 LAB — MAGNESIUM: Magnesium: 2.5 mg/dL — ABNORMAL HIGH (ref 1.7–2.4)

## 2019-12-17 LAB — FUNGITELL, SERUM: Fungitell Result: <31 pg/mL (ref <80)

## 2019-12-17 MED ORDER — PNEUMOCOCCAL VAC POLYVALENT 25 MCG/0.5ML IJ INJ: 0.5000 mL | INJECTION | Freq: Once | INTRAMUSCULAR | 0 refills | Status: AC

## 2019-12-17 MED ORDER — SODIUM POLYSTYRENE SULFONATE 15 GM/60ML PO SUSP
15.0000 g | Freq: Once | ORAL | Status: AC
  Administered 2019-12-17: 10:00:00 15 g via ORAL
  Filled 2019-12-17: qty 60

## 2019-12-17 MED ORDER — PREDNISONE 20 MG PO TABS: 40.0000 mg | ORAL_TABLET | Freq: Every day | ORAL | 0 refills | Status: AC

## 2019-12-17 MED ORDER — PANTOPRAZOLE SODIUM 40 MG PO TBEC: 40.0000 mg | DELAYED_RELEASE_TABLET | Freq: Two times a day (BID) | ORAL | 2 refills | Status: AC

## 2019-12-17 MED ORDER — LEVOFLOXACIN 750 MG PO TABS: 750.0000 mg | ORAL_TABLET | Freq: Every day | ORAL | 0 refills | Status: AC

## 2019-12-17 NOTE — Discharge Summary (Signed)
Physician Discharge Summary   Donald Sellers  male DOB: 1950/10/11  ZOX:[id number]  PCP: Patient, No Pcp Per  Admit date: 12/11/2019 Discharge date: 12/17/2019  Admitted From: home Disposition:  home CODE STATUS: Full code  Discharge Instructions    Discharge instructions   Complete by: As directed    You are discharged with supplemental oxygen.  Please take oral antibiotic Levaquin for 3 more days at home as directed for your pneumonia.  Continue to take prednisone 40 mg daily until you follow up with pulmonary doctor.  Please have outpatient doctor check your potassium level at followup.   Dr. [facility] Course:  For full details, please see H&P, progress notes, consult notes and ancillary notes.  Briefly,  Donald Smithis a 69 y.o.Caucasianmalewithno significantmedical historywho presented with acute worsening of dyspnea.     # Acute hypoxic respiratory failure  Pt needed 6L on presentation. COVID neg. CTA neg for PE, but showed "Right-sided volume loss with subpleural architectural distortion/honeycombing. Extensive superimposed ground-glass pulmonary infiltrate" and "Superimposed consolidation within the left upper lobe."  O2 requirement down to 4L on 7/24, pt reported breathing better.  Pt was treated for PNA and pulm fibrosis as below.  TTE showed normal LVEF and grade I diastolic dysfunction.  IV lasix 20 mg daily was added by pulm in an attempt to dry out the lungs a bit, however, despite good urine output, O2 requirement did not decrease from the diuresis.  Prior to discharge, pt needed 3L O2 at rest and 5L with walking.  # Acute exacerbation of Pulmonary fibrosis New dx. Per hx, likely slowly progressing and acutely worsened due to superimposed PNA.  Procal neg x2.  cryptococcal antigen neg.  Pulm consulted and started pt on solumedrol 20 mg BID and transitioned to prednisone 40 mg daily before discharge.  Pt was discharged on  prednisone 40 mg daily until outpatient pulm followup.  # Left upper lobePNA Pt presented with leukocytosis, but no fever and procal neg. CTA showed consolidation.  Pt was started on ceftriaxone and azithro in the ED, and switched to Levaquin on 7/24.  Pt was discharged on Levaquin for a total of 10-day course, per pulm.  # GERD Per pulm, GERD should be tightly controlled while in acute exacerbation of IPF.  continued protonix BID   Discharge Diagnoses:  Active Problems:   Acute respiratory failure with hypoxia [facility])    Discharge Instructions:  Allergies as of 12/17/2019   No Known Allergies     Medication List    STOP taking these medications   acetaminophen 500 MG tablet Commonly known as: TYLENOL     TAKE these medications   albuterol 108 (90 Base) MCG/ACT inhaler Commonly known as: VENTOLIN HFA Inhale 1-2 puffs into the lungs every 4 (four) hours as needed for wheezing or shortness of breath.   levofloxacin 750 MG tablet Commonly known as: LEVAQUIN Take 1 tablet (750 mg total) by mouth daily for 3 days. Antibiotic for pneumonia. Start taking on: December 18, 2019   pantoprazole 40 MG tablet Commonly known as: PROTONIX Take 1 tablet (40 mg total) by mouth 2 (two) times daily.   pneumococcal 23 valent vaccine 25 MCG/0.5ML injection Commonly known as: PNEUMOVAX-23 Inject 0.5 mLs into the muscle once for 1 dose.   predniSONE 20 MG tablet Commonly known as: DELTASONE Take 2 tablets (40 mg total) by mouth daily with breakfast for 14 days. Or until follow up with  pulmonary.   sodium chloride 0.65 % Soln nasal spray Commonly known as: OCEAN Place 2 sprays into both nostrils every 2 (two) hours while awake.   triamcinolone 55 MCG/ACT Aero nasal inhaler Commonly known as: NASACORT Place 1 spray into the nose 2 (two) times daily.            Durable Medical Equipment  (From admission, onward)         Start     Ordered   12/16/19 1651  For home use only DME  oxygen  Once       Question Answer Comment  Length of Need 6 Months   Mode or (Route) Nasal cannula   Liters per Minute 5   Frequency Continuous (stationary and portable oxygen unit needed)   Oxygen delivery system Gas      12/16/19 1650           Follow-up Information    Vida RiggerAleskerov, Fuad, MD. Schedule an appointment as soon as possible for a visit in 1 week(s).   Specialty: Pulmonary Disease Contact information: [address] [place] [address] [phone]               No Known Allergies   The results of significant diagnostics from this hospitalization (including imaging, microbiology, ancillary and laboratory) are listed below for reference.   Consultations:   Procedures/Studies: CT Angio Chest PE W and/or Wo Contrast  Result Date: 12/11/2019 CLINICAL DATA:  Dyspnea EXAM: CT ANGIOGRAPHY CHEST WITH CONTRAST TECHNIQUE: Multidetector CT imaging of the chest was performed using the standard protocol during bolus administration of intravenous contrast. Multiplanar CT image reconstructions and MIPs were obtained to evaluate the vascular anatomy. CONTRAST:  75mL OMNIPAQUE IOHEXOL 350 MG/ML SOLN COMPARISON:  None. FINDINGS: Cardiovascular: Satisfactory opacification of the pulmonary arteries to the segmental level. No evidence of pulmonary embolism. The central pulmonary arteries are enlarged in keeping with changes of pulmonary arterial hypertension. Moderate coronary artery calcification primarily within the left anterior descending coronary artery. Global cardiac size within normal limits. No pericardial effusion. Thoracic aorta is unremarkable. Mediastinum/Nodes: There is extensive shotty mediastinal and bilateral hilar adenopathy, likely reactive in nature. No frankly pathologic mediastinal adenopathy. Visualized thyroid is unremarkable. Esophagus is unremarkable. Lungs/Pleura: There is asymmetric right-sided volume loss, similar to that noted on prior chest  radiograph. There is peripheral, largely subpleural architectural distortion and honeycombing as well as traction bronchiectasis, best noted within the right upper lobe and lower lobes bilaterally. There is extensive asymmetric ground-glass pulmonary infiltrate throughout the right lung though small amount is also identified within the left lung base. The degree of architectural distortion is relatively minor. The findings are suggestive of for a broad acute interstitial lung disease and though nonspecific interstitial pneumonia (NSIP) is favored, the differential would include UIP. This is not optimally characterized on this examination. There is, however, more superimposed consolidation within the left upper lobe which appears distinct from the more diffuse pulmonary process and likely represents acute lobar pneumonia in the appropriate clinical setting. No pneumothorax or pleural effusion. No central obstructing mass lesion. Upper Abdomen: Unremarkable Musculoskeletal: No acute bone abnormality. Review of the MIP images confirms the above findings. IMPRESSION: No pulmonary embolism. Right-sided volume loss with subpleural architectural distortion/honeycombing. Extensive superimposed ground-glass pulmonary infiltrate. 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 Follow-up high-resolution CT examination of the chest is recommended in 3 months, after appropriate antibiotic therapy for the patient's underlying acute pneumonia.  Pulmonary consultation would be helpful for further management. Superimposed consolidation within the left upper lobe most in keeping with acute lobar pneumonia. Moderate coronary artery calcification. Electronically Signed   By: Helyn Numbers MD   On: 12/11/2019 15:37   DG Chest Port 1 View  Result Date: 12/16/2019 CLINICAL DATA:  Shortness of breath EXAM: PORTABLE CHEST 1 VIEW COMPARISON:  Chest radiograph and chest CT  December 11, 2019 FINDINGS: There is extensive underlying fibrosis. There are areas of patchy airspace opacity superimposed on these fibrotic areas, essentially stable on the right. There has been slight clearing of opacity from the left upper lobe. No new opacity evident. Heart is upper normal in size with pulmonary vascularity normal. No adenopathy. No bone lesions. IMPRESSION: Widespread fibrotic type change. Superimposed pneumonia overlying areas of fibrosis difficult to exclude. There has been slight clearing from the left upper lobe compared to recent study suggesting partial clearing of pneumonia from this region. No new opacity is evident. Stable cardiac silhouette. Electronically Signed   By: Bretta Bang III M.D.   On: 12/16/2019 07:32   DG Chest Portable 1 View  Result Date: 12/11/2019 CLINICAL DATA:  Shortness of breath EXAM: PORTABLE CHEST 1 VIEW COMPARISON:  None. FINDINGS: Heart size is within normal limits. Low lung volumes. Extensive bilateral interstitial opacities throughout both lungs. No large pleural fluid collection. No pneumothorax. IMPRESSION: Extensive bilateral interstitial opacities throughout both lungs. Findings may represent pulmonary edema versus multifocal atypical/viral infection. Electronically Signed   By: Duanne Guess D.O.   On: 12/11/2019 14:22   ECHOCARDIOGRAM COMPLETE  Result Date: 12/14/2019    ECHOCARDIOGRAM REPORT   Patient Name:   DIEM PAGNOTTA Date of Exam: 12/14/2019 Medical Rec #:  [id number]     Height:       70.0 in Accession #:    [id number]    Weight:       184.2 lb Date of Birth:  February 28, 1951    BSA:          2.016 m Patient Age:    68 years      BP:           101/73 mmHg Patient Gender: M             HR:           85 bpm. Exam Location:  [facility] Procedure: 2D Echo, Cardiac Doppler and Color Doppler Indications:     CHF-Acute diastolic 428.31  History:         Patient has no prior history of Echocardiogram examinations.                  CHF; COPD.   Sonographer:     Cristela Blue RDCS (AE) Referring Phys:  [id number] Vida Rigger Diagnosing Phys: Lorine Bears MD IMPRESSIONS  1. Left ventricular ejection fraction, by estimation, is 55 to 60%. The left ventricle has normal function. The left ventricle has no regional wall motion abnormalities. There is mild left ventricular hypertrophy. Left ventricular diastolic parameters are consistent with Grade I diastolic dysfunction (impaired relaxation).  2. Right ventricular systolic function is normal. The right ventricular size is normal. There is moderately elevated pulmonary artery systolic pressure. The estimated right ventricular systolic pressure is 49.7 mmHg.  3. The mitral valve is normal in structure. No evidence of mitral valve regurgitation. No evidence of mitral stenosis.  4. The aortic valve is normal in structure. Aortic valve regurgitation is not visualized. No aortic stenosis is present. FINDINGS  Left  Ventricle: Left ventricular ejection fraction, by estimation, is 55 to 60%. The left ventricle has normal function. The left ventricle has no regional wall motion abnormalities. The left ventricular internal cavity size was normal in size. There is  mild left ventricular hypertrophy. Left ventricular diastolic parameters are consistent with Grade I diastolic dysfunction (impaired relaxation). Right Ventricle: The right ventricular size is normal. No increase in right ventricular wall thickness. Right ventricular systolic function is normal. There is moderately elevated pulmonary artery systolic pressure. The tricuspid regurgitant velocity is 3.23 m/s, and with an assumed right atrial pressure of 8 mmHg, the estimated right ventricular systolic pressure is 49.7 mmHg. Left Atrium: Left atrial size was normal in size. Right Atrium: Right atrial size was normal in size. Pericardium: There is no evidence of pericardial effusion. Mitral Valve: The mitral valve is normal in structure. Normal mobility of the mitral  valve leaflets. No evidence of mitral valve regurgitation. No evidence of mitral valve stenosis. Tricuspid Valve: The tricuspid valve is normal in structure. Tricuspid valve regurgitation is mild . No evidence of tricuspid stenosis. Aortic Valve: The aortic valve is normal in structure. Aortic valve regurgitation is not visualized. No aortic stenosis is present. Aortic valve mean gradient measures 3.5 mmHg. Aortic valve peak gradient measures 5.9 mmHg. Aortic valve area, by VTI measures 3.07 cm. Pulmonic Valve: The pulmonic valve was normal in structure. Pulmonic valve regurgitation is not visualized. No evidence of pulmonic stenosis. Aorta: The aortic root is normal in size and structure. Venous: The inferior vena cava was not well visualized. IAS/Shunts: No atrial level shunt detected by color flow Doppler.  LEFT VENTRICLE PLAX 2D LVIDd:         4.24 cm  Diastology LVIDs:         2.72 cm  LV e' lateral:   6.64 cm/s LV PW:         1.03 cm  LV E/e' lateral: 6.8 LV IVS:        0.83 cm  LV e' medial:    5.11 cm/s LVOT diam:     2.00 cm  LV E/e' medial:  8.9 LV SV:         59 LV SV Index:   29 LVOT Area:     3.14 cm  RIGHT VENTRICLE RV Basal diam:  3.43 cm RV S prime:     10.20 cm/s TAPSE (M-mode): 3.0 cm LEFT ATRIUM             Index       RIGHT ATRIUM           Index LA diam:        3.10 cm 1.54 cm/m  RA Area:     12.80 cm LA Vol (A2C):   41.6 ml 20.64 ml/m RA Volume:   28.40 ml  14.09 ml/m LA Vol (A4C):   31.3 ml 15.53 ml/m LA Biplane Vol: 37.2 ml 18.46 ml/m  AORTIC VALVE                   PULMONIC VALVE AV Area (Vmax):    2.53 cm    RVOT Peak grad: 2 mmHg AV Area (Vmean):   2.71 cm AV Area (VTI):     3.07 cm AV Vmax:           121.00 cm/s AV Vmean:          87.100 cm/s AV VTI:            0.194 m AV  Peak Grad:      5.9 mmHg AV Mean Grad:      3.5 mmHg LVOT Vmax:         97.30 cm/s LVOT Vmean:        75.200 cm/s LVOT VTI:          0.189 m LVOT/AV VTI ratio: 0.98  AORTA Ao Root diam: 3.60 cm MITRAL VALVE                TRICUSPID VALVE MV Area (PHT): 3.40 cm    TR Peak grad:   41.7 mmHg MV Decel Time: 223 msec    TR Vmax:        323.00 cm/s MV E velocity: 45.40 cm/s MV A velocity: 65.10 cm/s  SHUNTS MV E/A ratio:  0.70        Systemic VTI:  0.19 m                            Systemic Diam: 2.00 cm Lorine Bears MD Electronically signed by Lorine Bears MD Signature Date/Time: 12/14/2019/11:32:49 AM    Final       Labs: BNP (last 3 results) Recent Labs    12/11/19 1314  BNP 151.2*   Basic Metabolic Panel: Recent Labs  Lab 12/13/19 0620 12/14/19 0518 12/15/19 0534 12/16/19 0510 12/17/19 0517  NA 136 139 138 136 136  K 5.5* 4.8 5.1 5.7* 5.2*  CL 100 100 99 97* 96*  CO2 26 31 32 30 31  GLUCOSE 123* 97 97 105* 90  BUN 22 27* 24* 22 24*  CREATININE 0.77 0.89 0.77 0.85 0.77  CALCIUM 9.3 9.0 9.1 9.3 8.8*  MG 2.3 2.2 2.2 2.5* 2.5*   Liver Function Tests: Recent Labs  Lab 12/11/19 1314  AST 27  ALT 16  ALKPHOS 116  BILITOT 1.3*  PROT 8.9*  ALBUMIN 4.0   No results for input(s): LIPASE, AMYLASE in the last 168 hours. No results for input(s): AMMONIA in the last 168 hours. CBC: Recent Labs  Lab 12/11/19 1314 12/12/19 0503 12/13/19 0620 12/14/19 0518 12/15/19 0534 12/16/19 0510 12/17/19 0517  WBC 18.0*   < > 18.0* 15.1* 14.8* 13.8* 14.3*  NEUTROABS 14.3*  --   --   --   --   --   --   HGB 14.8   < > 13.7 13.7 13.8 14.3 13.7  HCT 45.3   < > 40.9 41.7 41.0 42.9 42.0  MCV 89.3   < > 88.9 90.3 88.4 88.6 90.7  PLT 354   < > 366 387 355 390 396   < > = values in this interval not displayed.   Cardiac Enzymes: No results for input(s): CKTOTAL, CKMB, CKMBINDEX, TROPONINI in the last 168 hours. BNP: Invalid input(s): POCBNP CBG: No results for input(s): GLUCAP in the last 168 hours. D-Dimer No results for input(s): DDIMER in the last 72 hours. Hgb A1c No results for input(s): HGBA1C in the last 72 hours. Lipid Profile No results for input(s): CHOL, HDL, LDLCALC, TRIG,  CHOLHDL, LDLDIRECT in the last 72 hours. Thyroid function studies No results for input(s): TSH, T4TOTAL, T3FREE, THYROIDAB in the last 72 hours.  Invalid input(s): FREET3 Anemia work up No results for input(s): VITAMINB12, FOLATE, FERRITIN, TIBC, IRON, RETICCTPCT in the last 72 hours. Urinalysis    Component Value Date/Time   COLORURINE YELLOW (A) 12/11/2019 2052   APPEARANCEUR CLEAR (A) 12/11/2019 2052   LABSPEC >1.046 (H) 12/11/2019  2052   PHURINE 6.0 12/11/2019 2052   GLUCOSEU NEGATIVE 12/11/2019 2052   HGBUR NEGATIVE 12/11/2019 2052   BILIRUBINUR NEGATIVE 12/11/2019 2052   KETONESUR 20 (A) 12/11/2019 2052   PROTEINUR NEGATIVE 12/11/2019 2052   NITRITE NEGATIVE 12/11/2019 2052   LEUKOCYTESUR NEGATIVE 12/11/2019 2052   Sepsis Labs Invalid input(s): PROCALCITONIN,  WBC,  LACTICIDVEN Microbiology Recent Results (from the past 240 hour(s))  Blood Culture (routine x 2)     Status: None   Collection Time: 12/11/19  1:14 PM   Specimen: BLOOD  Result Value Ref Range Status   Specimen Description BLOOD RIGHT ANTECUBITAL  Final   Special Requests   Final    BOTTLES DRAWN AEROBIC AND ANAEROBIC Blood Culture adequate volume   Culture   Final    NO GROWTH 5 DAYS Performed at [facility], [address], [place], [place] [address]    Report Status 12/16/2019 FINAL  Final  SARS Coronavirus 2 by RT PCR (hospital order, performed in [facility] hospital lab) Nasopharyngeal Nasopharyngeal Swab     Status: None   Collection Time: 12/11/19  2:02 PM   Specimen: Nasopharyngeal Swab  Result Value Ref Range Status   SARS Coronavirus 2 NEGATIVE NEGATIVE Final    Comment: (NOTE) SARS-CoV-2 target nucleic acids are NOT DETECTED.  The SARS-CoV-2 RNA is generally detectable in upper and lower respiratory specimens during the acute phase of infection. The lowest concentration of SARS-CoV-2 viral copies this assay can detect is 250 copies / mL. A negative result does not preclude  SARS-CoV-2 infection and should not be used as the sole basis for treatment or other patient management decisions.  A negative result may occur with improper specimen collection / handling, submission of specimen other than nasopharyngeal swab, presence of viral mutation(s) within the areas targeted by this assay, and inadequate number of viral copies (<250 copies / mL). A negative result must be combined with clinical observations, patient history, and epidemiological information.  Fact Sheet for Patients:   BoilerBrush.com.cy  Fact Sheet for Healthcare Providers: https://pope.com/  This test is not yet approved or  cleared by the [place] FDA and has been authorized for detection and/or diagnosis of SARS-CoV-2 by FDA under an Emergency Use Authorization (EUA).  This EUA will remain in effect (meaning this test can be used) for the duration of the COVID-19 declaration under Section 564(b)(1) of the Act, 21 U.S.C. section 360bbb-3(b)(1), unless the authorization is terminated or revoked sooner.  Performed at [facility], [address] [place], [place] [address]   Blood Culture (routine x 2)     Status: None   Collection Time: 12/11/19  2:03 PM   Specimen: BLOOD  Result Value Ref Range Status   Specimen Description BLOOD BLOOD LEFT FOREARM  Final   Special Requests   Final    BOTTLES DRAWN AEROBIC AND ANAEROBIC Blood Culture adequate volume   Culture   Final    NO GROWTH 5 DAYS Performed at [facility], [address] [place], [place] [address]    Report Status 12/16/2019 FINAL  Final  Expectorated sputum assessment w rflx to resp cult     Status: None   Collection Time: 12/11/19  8:50 PM   Specimen: Urine, Clean Catch; Sputum  Result Value Ref Range Status   Specimen Description Expect. Sput  Final   Special Requests NONE  Final   Sputum evaluation   Final    THIS SPECIMEN IS ACCEPTABLE FOR  SPUTUM CULTURE  Performed at [facility], [address] [place], [place] [address]    Report Status 12/12/2019 FINAL  Final  Culture, respiratory     Status: None   Collection Time: 12/11/19  8:50 PM  Result Value Ref Range Status   Specimen Description   Final    Expect. Sput Performed at [facility], [address] [place], [place] [address]    Special Requests   Final    NONE Reflexed from [id number] Performed at [facility], [address] [place], [place] [address]    Gram Stain   Final    MODERATE WBC PRESENT, PREDOMINANTLY PMN RARE SQUAMOUS EPITHELIAL CELLS PRESENT FEW GRAM POSITIVE COCCI IN PAIRS RARE GRAM POSITIVE RODS RARE GRAM NEGATIVE RODS    Culture   Final    Consistent with normal respiratory flora. Performed at [facility], [address], [place], [place] [address]    Report Status 12/14/2019 FINAL  Final  Urine culture     Status: None   Collection Time: 12/11/19  8:52 PM   Specimen: Urine, Random  Result Value Ref Range Status   Specimen Description   Final    URINE, RANDOM Performed at [facility], [address] [place], [place] [address]    Special Requests   Final    NONE Performed at [facility], [address] [place], [place] [address]    Culture   Final    NO GROWTH Performed at [facility], [address] [place], [place] [address]    Report Status 12/13/2019 FINAL  Final  Expectorated sputum assessment w rflx to resp cult     Status: None   Collection Time: 12/11/19  8:52 PM   Specimen: Sputum  Result Value Ref Range Status   Specimen Description SPUTUM  Final   Special Requests   Final    NONE Performed at [facility], [address] [place], [place] [address]    Report Status 12/15/2019 FINAL  Final  Respiratory Panel by RT PCR (Flu A&B, Covid) - Nasopharyngeal Swab     Status: None   Collection Time: 12/12/19 10:55 AM   Specimen: Nasopharyngeal  Swab  Result Value Ref Range Status   SARS Coronavirus 2 by RT PCR NEGATIVE NEGATIVE Final    Comment: (NOTE) SARS-CoV-2 target nucleic acids are NOT DETECTED.  The SARS-CoV-2 RNA is generally detectable in upper respiratoy specimens during the acute phase of infection. The lowest concentration of SARS-CoV-2 viral copies this assay can detect is 131 copies/mL. A negative result does not preclude SARS-Cov-2 infection and should not be used as the sole basis for treatment or other patient management decisions. A negative result may occur with  improper specimen collection/handling, submission of specimen other than nasopharyngeal swab, presence of viral mutation(s) within the areas targeted by this assay, and inadequate number of viral copies (<131 copies/mL). A negative result must be combined with clinical observations, patient history, and epidemiological information. The expected result is Negative.  Fact Sheet for Patients:  https://www.moore.com/  Fact Sheet for Healthcare Providers:  https://www.young.biz/  This test is no t yet approved or cleared by the [place] FDA and  has been authorized for detection and/or diagnosis of SARS-CoV-2 by FDA under an Emergency Use Authorization (EUA). This EUA will remain  in effect (meaning this test can be used) for the duration of the COVID-19 declaration under Section 564(b)(1) of the Act, 21 U.S.C. section 360bbb-3(b)(1), unless the  authorization is terminated or revoked sooner.     Influenza A by PCR NEGATIVE NEGATIVE Final   Influenza B by PCR NEGATIVE NEGATIVE Final    Comment: (NOTE) The Xpert Xpress SARS-CoV-2/FLU/RSV assay is intended as an aid in  the diagnosis of influenza from Nasopharyngeal swab specimens and  should not be used as a sole basis for treatment. Nasal washings and  aspirates are unacceptable for Xpert Xpress SARS-CoV-2/FLU/RSV  testing.  Fact Sheet for  Patients: https://www.moore.com/  Fact Sheet for Healthcare Providers: https://www.young.biz/  This test is not yet approved or cleared by the [place] FDA and  has been authorized for detection and/or diagnosis of SARS-CoV-2 by  FDA under an Emergency Use Authorization (EUA). This EUA will remain  in effect (meaning this test can be used) for the duration of the  Covid-19 declaration under Section 564(b)(1) of the Act, 21  U.S.C. section 360bbb-3(b)(1), unless the authorization is  terminated or revoked. Performed at [facility], [address] [place], [place] [address]   Aspergillus Ag, BAL/Serum     Status: None   Collection Time: 12/14/19  5:18 AM   Specimen: Vein  Result Value Ref Range Status   Aspergillus Ag, BAL/Serum 0.11 0.00 - 0.49 Index Final    Comment: (NOTE) Performed At: [facility] [address] [place] [facility] [place], [place] [id number] Jolene Schimke MD GN:[id number]      Total time spend on discharging this patient, including the last patient exam, discussing the hospital stay, instructions for ongoing care as it relates to all pertinent caregivers, as well as preparing the medical discharge records, prescriptions, and/or referrals as applicable, is 40 minutes.    Darlin Priestly, MD  [facility] 12/17/2019, 8:33 AM  If 7PM-7AM, please contact night-coverage

## 2019-12-17 NOTE — Progress Notes (Signed)
Date: 12/17/2019,   MRN# [id number] Donald Sellers 10/16/1950 Code Status:     Code Status Orders  (From admission, onward)         Start     Ordered   12/11/19 2015  Full code  Continuous        12/11/19 2015        Code Status History    This patient has a current code status but no historical code status.   Advance Care Planning Activity     Hosp day:@LENGTHOFSTAYDAYS @ Referring MD: @ATDPROV @     PCP:    HPI: this am ready to go home. Back on 4 liters, comfortable.  Limited by anxiety  PMHX:   Past Medical History:  Diagnosis Date  . Patient denies medical problems    Surgical Hx:  Past Surgical History:  Procedure Laterality Date  . EYE SURGERY     Family Hx:  Family History  Problem Relation Age of Onset  . Pulmonary fibrosis Neg Hx    Social Hx:   Social History   Tobacco Use  . Smoking status: Former Smoker    Packs/day: 1.00    Years: 11.00    Pack years: 11.00    Types: Cigarettes    Quit date: 1980    Years since quitting: 41.6  . Smokeless tobacco: Never Used  Substance Use Topics  . Alcohol use: Yes  . Drug use: Never   Medication:    Home Medication:  Current Outpatient Rx  . [START ON 12/18/2019] Order #: 12/20/2019 Class: Normal  . Order #: [id number] Class: Normal  . Order #: [id number] Class: Normal  . Order #: [id number] Class: Normal    Current Medication: @CURMEDTAB @   Allergies:  Patient has no known allergies.  Review of Systems: Gen:  Denies  fever, sweats, chills HEENT: Denies blurred vision, double vision, ear pain, eye pain, hearing loss, nose bleeds, sore throat Cvc:  No dizziness, chest pain or heaviness Resp:  Dyspneic with minimum activity  Gi: Denies swallowing difficulty, stomach pain, nausea or vomiting, diarrhea, constipation, bowel incontinence Gu:  Denies bladder incontinence, burning urine Ext:   No Joint pain, stiffness or swelling Skin: No skin rash, easy bruising or bleeding or hives Endoc:  No polyuria,  polydipsia , polyphagia or weight change Psych: No depression, insomnia or hallucinations  Other:  All other systems negative  Physical Examination:   VS: BP 114/76 (BP Location: Left Arm)   Pulse 70   Temp 97.7 F (36.5 C) (Oral)   Resp 18   Ht 5\' 10"  (1.778 m)   Wt 83.6 kg   SpO2 98%   BMI 26.43 kg/m   General Appearance: No distress  Neuro: without focal findings, mental status, speech normal, alert and oriented, cranial nerves 2-12 intact, reflexes normal and symmetric, sensation grossly normal  HEENT: PERRLA, EOM intact, no ptosis, no other lesions noticed, Mallampati: Pulmonary:.No wheezing, No rales  + cracles Cardiovascular:  Normal S1,S2.  No m/r/g.  Tachycardia better.    Abdomen:Benign, Soft, non-tender, No masses, hepatosplenomegaly, No lymphadenopathy Endoc: No evident thyromegaly, no signs of acromegaly or Cushing features Skin:   warm, no rashes, no ecchymosis  Extremities: normal, no cyanosis, clubbing, no edema, warm with normal capillary refill.    Labs results:   Recent Labs    12/15/19 0534 12/16/19 0510 12/17/19 0517  HGB 13.8 14.3 13.7  HCT 41.0 42.9 42.0  MCV 88.4 88.6 90.7  WBC 14.8* 13.8* 14.3*  BUN 24* 22 24*  CREATININE 0.77  0.85 0.77  GLUCOSE 97 105* 90  CALCIUM 9.1 9.3 8.8*  ,         Assessment and Plan:      Acute exacerbation of Idiopathic pulmonary fibosis,presently radiology leaning towards NSIP, better prognosis vs UIP. On repeat review of chest ct, there is honey combing. That is seen more so with UIP which I think is where he is heading. here is left upper lobe consolidation suggestive of acute infectious cause of current exacerbation. Today's cxr showed partial LUL clearing. Seem the pneumonia is respondingis down to 2 liters sats 90-94 % while in the room. -prednisone 40 until he sees [facility] in [facility]   -add esbriet to regimen ( will clear with his insurance)   -Levoquin 750 po daily x five days -fio2  down at four liters at rest and 5 liters with ambulation   -Incentive spirometry q 4 hrs   -repeat chset in 3 months   -repeat cxr next week at [facility]   -out pft pft when pneumonia is resolved   -home today  I have personally obtained a history, examined the patient, evaluated laboratory and imaging results, formulated the assessment and plan and placed orders.  The Patient requires high complexity decision making for assessment and support, frequent evaluation and titration of therapies, application of advanced monitoring technologies and extensive interpretation of multiple databases.   Ben Sanz,M.D. Pulmonary & Critical care Medicine [facility]

## 2019-12-17 NOTE — Progress Notes (Signed)
Pt currently on 4L [place] at rest. Walking trial complete. Pt ambulate in the hallway and dropped to 83%. Unable to quickly recover. O2 increased to 6L. Pt assisted back to room. At rest recovered to 94-98% on 4L . O2 continues to drop with exertion    12/17/19 0807  Vitals  Temp 97.7 F (36.5 C)  Temp Source Oral  BP 114/76  MAP (mmHg) 88  BP Location Left Arm  BP Method Automatic  Patient Position (if appropriate) Sitting  Pulse Rate 70  Resp 18  MEWS COLOR  MEWS Score Color Green  Oxygen Therapy  SpO2 98 %  O2 Device Nasal Cannula  O2 Flow Rate (L/min) 4 L/min  Pain Assessment  Pain Scale 0-10  Pain Score 0  MEWS Score  MEWS Temp 0  MEWS Systolic 0  MEWS Pulse 0  MEWS RR 0  MEWS LOC 0  MEWS Score 0

## 2019-12-17 NOTE — Progress Notes (Signed)
SATURATION QUALIFICATIONS: (This note is used to comply with regulatory documentation for home oxygen)  Patient Saturations on Room Air at Rest = 86%  Patient Saturations on Room Air while Ambulating = 79%  Patient Saturations on 4 Liters of oxygen while Ambulating = 87%  Please briefly explain why patient needs home oxygen: Pt needs supplemental O2 at home do to inability to maintain adequate O2 saturation on RA and also has dyspnea with exertion

## 2019-12-18 ENCOUNTER — Telehealth: Payer: Self-pay | Admitting: Registered Nurse

## 2019-12-18 ENCOUNTER — Encounter: Payer: Self-pay | Admitting: Registered Nurse

## 2019-12-18 DIAGNOSIS — J189 Pneumonia, unspecified organism: Secondary | ICD-10-CM

## 2019-12-18 DIAGNOSIS — K219 Gastro-esophageal reflux disease without esophagitis: Secondary | ICD-10-CM
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
## 2019-12-19 DIAGNOSIS — K219 Gastro-esophageal reflux disease without esophagitis: Secondary | ICD-10-CM | POA: Insufficient documentation

## 2019-12-19 NOTE — Telephone Encounter (Signed)
Follow up with patient via telephone to see if any discharge questions.  Patient stated his nose has been very dry from using home oxygen as no humidification wondering if okay to use nasal saline.  Discussed with patient he can use nasal saline or ayr nasal gel or both to help with dry sinuses/nares.  Patient to discuss with his oxygen provider if humidification can be added.  He reported it felt much better in the hospital when the humidification was used.  Patient reported he has had some bloody nose discharge but it is not continuous.  Patient reported he has appt with Dr Karna Christmas this week 5 Aug at 1130 at [facility].  Reviewed hospital discharge instructions in Epic with patient to have potassium level check, chest xray next week and CT scan in 3 months. Outpatient PFTs once pneumonia resolved.  Finish 5 days of levoquin after discharge from hospital and continue prednisone 40mg  oral and protonix.  Patient reported the protonix has helped a great deal.  He didn't know he had heartburn but now that he is on medication he stated sore throat ams has resolved and breathing much easier.  He has been using his incentive spirometer more frequently than every 4 hours while awake.  He never did get his albuterol inhaler it was not in the bag he picked up at [facility] on hospital discharge.  Discussed with patient I did recommend for him to pick up inhaler on his next trip to [facility].  He has not needed the nasacort either as rhinitis/nasal congestion resolved.  Patient reported [facility] wanted him to come inside to get pneumonia shot when he picked up his medications.  Discussed with patient while he is on prednisone it will decrease his immune response to any vaccines/immunizations and I recommended he waits until his prednisone dose decreased and his pneumonia resolved.  Patient thankful for the care he received from everyone at the clinic and hospital.  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  President welcome to faculty 6 August.  Patient reported he has been sleeping better and feeling more energetic since wearing supplemental oxygen.  He has been able to get [phone] steps per day (his usual activity level prior to hospitalization) and his appetite has returned.  Patient aware his potassium level was elevated in hospital but almost normal on discharge (5.2). Calcium slightly low 8.8 on discharge.   Reviewed labs with patient noted magnesium elevated 2.5 during hospitalization and discussed with patient protonix can cause low magnesium blood levels.  Patient reported he was previously on a magnesium OTC supplement along with vitamins and fiber supplement to keep his stools regular but he stopped all those until he can discuss use with his specialists. Discussed with patient nuts are a good source of magnesium. Other rich sources of magnesium are greens, nuts, seeds, dry beans, whole grains, wheat germ, wheat and oat bran  Patient asked if I had reviewed his prior to discharge xray and I discussed with him that the chest xray report stated pneumonia resolving but not yet completely gone.  Patient reported his adult daughter lives out of state and is concerned for his health and would like to visit him ASAP.  Discussed with patient facetiming with her more often may help to alleviate her concerns regarding his current health status.  Patient aware he still does not  have definite pulmonary diagnosis but pulomonary team is working on determining a definitive diagnosis  Patient has noticed his pulse has been a little faster than usual but much lower then when he was in ER.  Discussed with patient side effect of being sick (pneumonia) and prednisone increased heart rate commonly seen along with increase or decrease in appetite, and trouble sleeping.  Patient reported sleeping well since out of hospital  as fewer interruptions for labs/meds/status checks, no alarms/monitors going off at home.  Duration of call 23 minutes.  Patient spoke in full sentences without difficulty, A&Ox3.  No cough heard during call or throat/nose clearing.  Patient verbalized understanding information/instructions, agreed with plan of care and had no further questions at this time.

## 2019-12-25 ENCOUNTER — Other Ambulatory Visit: Payer: Self-pay

## 2019-12-25 ENCOUNTER — Emergency Department
Admission: EM | Admit: 2019-12-25 | Discharge: 2019-12-25 | Disposition: A | Discharge status: Home / Self Care | Payer: BC Managed Care – PPO | Attending: Emergency Medicine | Admitting: Emergency Medicine

## 2019-12-25 ENCOUNTER — Encounter: Payer: Self-pay | Admitting: Emergency Medicine

## 2019-12-25 ENCOUNTER — Emergency Department: Payer: BC Managed Care – PPO

## 2019-12-25 ENCOUNTER — Other Ambulatory Visit: Payer: Self-pay | Admitting: Orthopedic Surgery

## 2019-12-25 DIAGNOSIS — W010XXA Fall on same level from slipping, tripping and stumbling without subsequent striking against object, initial encounter: Secondary | ICD-10-CM | POA: Insufficient documentation

## 2019-12-25 DIAGNOSIS — Y999 Unspecified external cause status: Secondary | ICD-10-CM | POA: Diagnosis not present

## 2019-12-25 DIAGNOSIS — Z87891 Personal history of nicotine dependence: Secondary | ICD-10-CM | POA: Diagnosis not present

## 2019-12-25 DIAGNOSIS — S82852A Displaced trimalleolar fracture of left lower leg, initial encounter for closed fracture: Secondary | ICD-10-CM

## 2019-12-25 DIAGNOSIS — Y92003 Bedroom of unspecified non-institutional (private) residence as the place of occurrence of the external cause: Secondary | ICD-10-CM | POA: Insufficient documentation

## 2019-12-25 DIAGNOSIS — Y9384 Activity, sleeping: Secondary | ICD-10-CM | POA: Insufficient documentation

## 2019-12-25 DIAGNOSIS — S99912A Unspecified injury of left ankle, initial encounter: Secondary | ICD-10-CM | POA: Diagnosis present

## 2019-12-25 MED ORDER — OXYCODONE-ACETAMINOPHEN 5-325 MG PO TABS: 1.0000 | ORAL_TABLET | Freq: Four times a day (QID) | ORAL | 0 refills | Status: DC | PRN

## 2019-12-25 MED ORDER — OXYCODONE-ACETAMINOPHEN 5-325 MG PO TABS
1.0000 | ORAL_TABLET | Freq: Once | ORAL | Status: AC
  Administered 2019-12-25: 1 via ORAL
  Filled 2019-12-25: qty 1

## 2019-12-25 NOTE — ED Notes (Signed)
See triage note  Presents s/p fall this am  States he got up  Foot was asleep  Became tangle in O2 tubing  Twisted left ankle  Min swelling  Good pulses

## 2019-12-25 NOTE — ED Triage Notes (Addendum)
Pt to triage via w/c with no distress noted; reports PTA tripped over O2 tank at home injuring left ankle; velcro splint in place as applied by EMS; denies any other injuries; home use O2 at 3l/min via [place]

## 2019-12-25 NOTE — ED Provider Notes (Signed)
[facility] Emergency Department Provider Note   ____________________________________________   First MD Initiated Contact with Patient 12/25/19 [phone]     (approximate)  I have reviewed the triage vital signs and the nursing notes.   HISTORY  Chief Complaint Ankle Injury    HPI Donald Sellers is a 69 y.o. male presents to the ED via EMS after patient fell this morning at approximately 6 AM.  Patient states that he was sleeping in his recliner and got up.  He states that he got his feet tangled in the tubing of his O2 tank causing him to fall.  Patient denies any head injury or loss of consciousness.  Patient denies any other injury other than his ankle.  Patient was also hospitalized on 12/11/2019 and has had his follow-up appointment with the pulmonologist at [facility].  He states he was diagnosed with idiopathic pulmonary fibrosis.  Patient discontinued smoking in 1980.  Patient had coffee and water beginning at 4 AM.  He believes that the last liquid he drank was around 6 AM.  He denies any food intake.  He rates his pain as 1/10.         Past Medical History:  Diagnosis Date  . Patient denies medical problems     Patient Active Problem List   Diagnosis Date Noted  . Gastroesophageal reflux disease 12/19/2019  . Acute respiratory failure with hypoxia (HCC) 12/11/2019    Past Surgical History:  Procedure Laterality Date  . EYE SURGERY      Prior to Admission medications   Medication Sig Start Date End Date Taking? Authorizing Provider  oxyCODONE-acetaminophen (PERCOCET) 5-325 MG tablet Take 1 tablet by mouth every 6 (six) hours as needed for moderate pain or severe pain. 12/25/19   Tommi Rumps, PA-C  pantoprazole (PROTONIX) 40 MG tablet Take 1 tablet (40 mg total) by mouth 2 (two) times daily. 12/17/19 03/16/20  Darlin Priestly, MD  predniSONE (DELTASONE) 20 MG tablet Take 2 tablets (40 mg total) by mouth daily with breakfast for 14 days. Or  until follow up with pulmonary. 12/17/19 12/31/19  Darlin Priestly, MD  sodium chloride (OCEAN) 0.65 % SOLN nasal spray Place 2 sprays into both nostrils every 2 (two) hours while awake. Patient not taking: Reported on 12/19/2019 12/11/19 01/10/20  Barbaraann Barthel, NP    Allergies Patient has no known allergies.  Family History  Problem Relation Age of Onset  . Pulmonary fibrosis Neg Hx     Social History Social History   Tobacco Use  . Smoking status: Former Smoker    Packs/day: 1.00    Years: 11.00    Pack years: 11.00    Types: Cigarettes    Quit date: 1980    Years since quitting: 41.6  . Smokeless tobacco: Never Used  Substance Use Topics  . Alcohol use: Yes  . Drug use: Never    Review of Systems Constitutional: No fever/chills Eyes: No visual changes. ENT: No sore throat. Cardiovascular: Denies chest pain. Respiratory: Continuous O2.  Positive history of idiopathic pulmonary fibrosis. Gastrointestinal: No abdominal pain.  No nausea, no vomiting.    Genitourinary: Negative for dysuria. Musculoskeletal: Positive for left ankle pain. Skin: Negative for rash. Neurological: Negative for headaches, focal weakness or numbness.  ____________________________________________   PHYSICAL EXAM:  VITAL SIGNS: ED Triage Vitals  Enc Vitals Group     BP 12/25/19 0654 115/69     Pulse Rate 12/25/19 0654 85     Resp  12/25/19 0654 (!) 22     Temp 12/25/19 0654 98 F (36.7 C)     Temp Source 12/25/19 0654 Oral     SpO2 12/25/19 0654 100 %     Weight 12/25/19 0647 180 lb (81.6 kg)     Height 12/25/19 0647 5\' 10"  (1.778 m)     Head Circumference --      Peak Flow --      Pain Score 12/25/19 0647 1     Pain Loc --      Pain Edu? --      Excl. in GC? --     Constitutional: Alert and oriented. Well appearing and in no acute distress. Eyes: Conjunctivae are normal. PERRL. EOMI. Head: Atraumatic. Nose: No trauma. Neck: No stridor.   Cardiovascular: Normal rate, regular  rhythm. Grossly normal heart sounds.  Good peripheral circulation. Respiratory: Normal respiratory effort.  No retractions. Lungs CTAB. Gastrointestinal: Soft and nontender. No distention. Musculoskeletal: Patient is able move upper extremities with any difficulty and right lower extremity.  Left lower extremity with moderate tenderness noted bilateral malleolus with some mild soft tissue edema present at this time.  No abrasions or discoloration noted.  Motor sensory function intact.  Dorsalis pedis and posterior tibial pulses were present and verified with Doppler. Neurologic:  Normal speech and language. No gross focal neurologic deficits are appreciated.  Skin:  Skin is warm, dry and intact. No rash noted. Psychiatric: Mood and affect are normal. Speech and behavior are normal.  ____________________________________________   LABS (all labs ordered are listed, but only abnormal results are displayed)  Labs Reviewed  COMPREHENSIVE METABOLIC PANEL   ____________________________________________  EKG Reviewed by major side physician. Normal sinus rhythm with a ventricular rate of 86.  ____________________________________________  RADIOLOGY   Official radiology report(s): DG Ankle Complete Left  Result Date: 12/25/2019 CLINICAL DATA:  Fall with ankle pain EXAM: LEFT ANKLE COMPLETE - 3+ VIEW COMPARISON:  None. FINDINGS: Acute trimalleolar fracture with fracture displacement and lateral subluxation of the foot relative to the ankle. IMPRESSION: Trimalleolar fracture with lateral foot subluxation. Electronically Signed   By: 02/24/2020 M.D.   On: 12/25/2019 07:20    ____________________________________________   PROCEDURES  Procedure(s) performed (including Critical Care):  Procedures OCL splint was applied by ED tech, 02/24/2020 and rechecked by myself after application.  ____________________________________________   INITIAL IMPRESSION / ASSESSMENT AND PLAN / ED  COURSE  As part of my medical decision making, I reviewed the following data within the electronic MEDICAL RECORD NUMBER Notes from prior ED visits and [place] Controlled Substance Database  69 year old male presents to the ED after falling when his feet got tangled in his O2 tubing at home.  Patient has a closed trimalleolar fracture of his left ankle.  Dr. 80 was made aware.  His instructions were to splint and follow-up with Dr. Franco Collet in the office.  After talking with the patient and his options patient elected to go home with splint and a walker.  Also pain medication was sent to his pharmacy.  He is aware that she should return to the emergency department if any worsening of his pain or urgent concerns.  He was given Dr. Odis Luster contact information and instructions to call the office for an appointment to discuss surgery.  Patient's pain was improved prior to discharge after being given Percocet 5 mg p.o. ____________________________________________   FINAL CLINICAL IMPRESSION(S) / ED DIAGNOSES  Final diagnoses:  Closed trimalleolar fracture of left ankle, initial  encounter     ED Discharge Orders         Ordered    oxyCODONE-acetaminophen (PERCOCET) 5-325 MG tablet  Every 6 hours PRN     Discontinue  Reprint     12/25/19 0838           Note:  This document was prepared using Dragon voice recognition software and may include unintentional dictation errors.    Tommi Rumps, PA-C 12/25/19 1059    Jene Every, MD 12/25/19 1215

## 2019-12-25 NOTE — Discharge Instructions (Addendum)
Call today make an appointment with Dr. Odis Luster who is on-call for orthopedics.  His contact information is listed on your discharge papers.  Ice and elevation to reduce swelling and help with pain.  No weightbearing.  Use walker when up.  This is only for going to the bathroom.  Pain medication was sent to your pharmacy.  Be aware that this medication could cause drowsiness and increase your risk for falling.  Dr. Odis Luster will see you in the office and make a plan for surgery.

## 2019-12-28 ENCOUNTER — Other Ambulatory Visit: Payer: Self-pay

## 2019-12-28 ENCOUNTER — Ambulatory Visit: Payer: BC Managed Care – PPO

## 2019-12-28 ENCOUNTER — Ambulatory Visit
Admission: RE | Admit: 2019-12-28 | Discharge: 2019-12-28 | Disposition: A | Discharge status: Home / Self Care | Payer: BC Managed Care – PPO | Attending: Orthopedic Surgery | Admitting: Orthopedic Surgery

## 2019-12-28 ENCOUNTER — Ambulatory Visit: Payer: BC Managed Care – PPO | Admitting: Anesthesiology

## 2019-12-28 ENCOUNTER — Encounter
Admission: RE | Disposition: A | Discharge status: Home / Self Care | Payer: Self-pay | Source: Home / Self Care | Attending: Orthopedic Surgery

## 2019-12-28 ENCOUNTER — Encounter: Payer: Self-pay | Admitting: Orthopedic Surgery

## 2019-12-28 DIAGNOSIS — S82852A Displaced trimalleolar fracture of left lower leg, initial encounter for closed fracture: Secondary | ICD-10-CM | POA: Diagnosis present

## 2019-12-28 DIAGNOSIS — J8410 Pulmonary fibrosis, unspecified: Secondary | ICD-10-CM | POA: Diagnosis not present

## 2019-12-28 DIAGNOSIS — Z87891 Personal history of nicotine dependence: Secondary | ICD-10-CM | POA: Insufficient documentation

## 2019-12-28 DIAGNOSIS — Z79899 Other long term (current) drug therapy: Secondary | ICD-10-CM | POA: Diagnosis not present

## 2019-12-28 DIAGNOSIS — W1839XA Other fall on same level, initial encounter: Secondary | ICD-10-CM | POA: Diagnosis not present

## 2019-12-28 DIAGNOSIS — K219 Gastro-esophageal reflux disease without esophagitis: Secondary | ICD-10-CM | POA: Diagnosis not present

## 2019-12-28 DIAGNOSIS — Z9981 Dependence on supplemental oxygen: Secondary | ICD-10-CM | POA: Insufficient documentation

## 2019-12-28 DIAGNOSIS — Z419 Encounter for procedure for purposes other than remedying health state, unspecified: Secondary | ICD-10-CM

## 2019-12-28 DIAGNOSIS — Z20822 Contact with and (suspected) exposure to covid-19: Secondary | ICD-10-CM | POA: Diagnosis not present

## 2019-12-28 HISTORY — PX: ORIF ANKLE FRACTURE: SHX5408

## 2019-12-28 LAB — PROTIME-INR
INR: 1 (ref 0.8–1.2)
Prothrombin Time: 12.9 seconds (ref 11.4–15.2)

## 2019-12-28 LAB — CBC
HCT: 39.8 % (ref 39.0–52.0)
Hemoglobin: 13.1 g/dL (ref 13.0–17.0)
MCH: 29.6 pg (ref 26.0–34.0)
MCHC: 32.9 g/dL (ref 30.0–36.0)
MCV: 89.8 fL (ref 80.0–100.0)
Platelets: 300 10*3/uL (ref 150–400)
RBC: 4.43 MIL/uL (ref 4.22–5.81)
RDW: 13.8 % (ref 11.5–15.5)
WBC: 16.5 10*3/uL — ABNORMAL HIGH (ref 4.0–10.5)
nRBC: 0 % (ref 0.0–0.2)

## 2019-12-28 LAB — BASIC METABOLIC PANEL
Anion gap: 8 (ref 5–15)
BUN: 16 mg/dL (ref 8–23)
CO2: 30 mmol/L (ref 22–32)
Calcium: 8.5 mg/dL — ABNORMAL LOW (ref 8.9–10.3)
Chloride: 101 mmol/L (ref 98–111)
Creatinine, Ser: 0.85 mg/dL (ref 0.61–1.24)
GFR calc Af Amer: >60 mL/min (ref >60)
GFR calc non Af Amer: >60 mL/min (ref >60)
Glucose, Bld: 96 mg/dL (ref 70–99)
Potassium: 3.5 mmol/L (ref 3.5–5.1)
Sodium: 139 mmol/L (ref 135–145)

## 2019-12-28 LAB — SARS CORONAVIRUS 2 BY RT PCR (HOSPITAL ORDER, PERFORMED IN ~~LOC~~ HOSPITAL LAB): SARS Coronavirus 2: NEGATIVE

## 2019-12-28 LAB — APTT: aPTT: 26 seconds (ref 24–36)

## 2019-12-28 SURGERY — OPEN REDUCTION INTERNAL FIXATION (ORIF) ANKLE FRACTURE
Anesthesia: Spinal | Site: Ankle | Laterality: Left

## 2019-12-28 MED ORDER — PROPOFOL 500 MG/50ML IV EMUL
INTRAVENOUS | Status: DC | PRN
  Administered 2019-12-28: 150 ug/kg/min via INTRAVENOUS

## 2019-12-28 MED ORDER — ACETAMINOPHEN 10 MG/ML IV SOLN
INTRAVENOUS | Status: DC | PRN
  Administered 2019-12-28: 1000 mg via INTRAVENOUS

## 2019-12-28 MED ORDER — HYDROCODONE-ACETAMINOPHEN 5-325 MG PO TABS: 1.0000 | ORAL_TABLET | ORAL | Status: DC | PRN

## 2019-12-28 MED ORDER — LIDOCAINE HCL (PF) 1 % IJ SOLN
INTRAMUSCULAR | Status: AC
  Filled 2019-12-28: qty 5

## 2019-12-28 MED ORDER — FENTANYL CITRATE (PF) 100 MCG/2ML IJ SOLN: 50.0000 ug | Freq: Once | INTRAMUSCULAR | Status: AC

## 2019-12-28 MED ORDER — PROPOFOL 500 MG/50ML IV EMUL
INTRAVENOUS | Status: AC
  Filled 2019-12-28: qty 50

## 2019-12-28 MED ORDER — MIDAZOLAM HCL 2 MG/2ML IJ SOLN
INTRAMUSCULAR | Status: AC
  Filled 2019-12-28: qty 2

## 2019-12-28 MED ORDER — CHLORHEXIDINE GLUCONATE 0.12 % MT SOLN
OROMUCOSAL | Status: AC
  Administered 2019-12-28: 15 mL via OROMUCOSAL
  Filled 2019-12-28: qty 15

## 2019-12-28 MED ORDER — ONDANSETRON HCL 4 MG PO TABS: 4.0000 mg | ORAL_TABLET | Freq: Four times a day (QID) | ORAL | Status: DC | PRN

## 2019-12-28 MED ORDER — DEXAMETHASONE SODIUM PHOSPHATE 10 MG/ML IJ SOLN
INTRAMUSCULAR | Status: AC
  Filled 2019-12-28: qty 1

## 2019-12-28 MED ORDER — ONDANSETRON HCL 4 MG/2ML IJ SOLN
INTRAMUSCULAR | Status: AC
  Filled 2019-12-28: qty 2

## 2019-12-28 MED ORDER — NEOMYCIN-POLYMYXIN B GU 40-200000 IR SOLN
Status: AC
  Filled 2019-12-28: qty 4

## 2019-12-28 MED ORDER — LACTATED RINGERS IV SOLN: INTRAVENOUS | Status: DC

## 2019-12-28 MED ORDER — BUPIVACAINE-EPINEPHRINE (PF) 0.25% -1:200000 IJ SOLN
INTRAMUSCULAR | Status: AC
  Filled 2019-12-28: qty 30

## 2019-12-28 MED ORDER — BUPIVACAINE HCL (PF) 0.5 % IJ SOLN
INTRAMUSCULAR | Status: DC | PRN
  Administered 2019-12-28: 2.5 mL

## 2019-12-28 MED ORDER — NEOMYCIN-POLYMYXIN B GU 40-200000 IR SOLN
Status: DC | PRN
  Administered 2019-12-28: 4 mL

## 2019-12-28 MED ORDER — ACETAMINOPHEN 10 MG/ML IV SOLN
INTRAVENOUS | Status: AC
  Filled 2019-12-28: qty 100

## 2019-12-28 MED ORDER — ROPIVACAINE HCL 5 MG/ML IJ SOLN
INTRAMUSCULAR | Status: AC
  Filled 2019-12-28: qty 30

## 2019-12-28 MED ORDER — DEXAMETHASONE SODIUM PHOSPHATE 10 MG/ML IJ SOLN
INTRAMUSCULAR | Status: DC | PRN
  Administered 2019-12-28: 5 mg via INTRAVENOUS

## 2019-12-28 MED ORDER — MIDAZOLAM HCL 2 MG/2ML IJ SOLN: 1.0000 mg | Freq: Once | INTRAMUSCULAR | Status: AC

## 2019-12-28 MED ORDER — CEFAZOLIN SODIUM-DEXTROSE 2-4 GM/100ML-% IV SOLN
2.0000 g | INTRAVENOUS | Status: AC
  Administered 2019-12-28: 2 g via INTRAVENOUS

## 2019-12-28 MED ORDER — METOCLOPRAMIDE HCL 10 MG PO TABS: 5.0000 mg | ORAL_TABLET | Freq: Three times a day (TID) | ORAL | Status: DC | PRN

## 2019-12-28 MED ORDER — CEFAZOLIN SODIUM-DEXTROSE 2-4 GM/100ML-% IV SOLN
INTRAVENOUS | Status: AC
  Filled 2019-12-28: qty 100

## 2019-12-28 MED ORDER — LIDOCAINE HCL (PF) 1 % IJ SOLN
INTRAMUSCULAR | Status: DC | PRN
Start: 2019-12-28 — End: 2019-12-28
  Administered 2019-12-28: 3 mL
  Administered 2019-12-28: 2 mL

## 2019-12-28 MED ORDER — ACETAMINOPHEN 325 MG PO TABS: 325.0000 mg | ORAL_TABLET | Freq: Four times a day (QID) | ORAL | Status: DC | PRN

## 2019-12-28 MED ORDER — KETOROLAC TROMETHAMINE 15 MG/ML IJ SOLN
INTRAMUSCULAR | Status: AC
  Filled 2019-12-28: qty 1

## 2019-12-28 MED ORDER — MIDAZOLAM HCL 2 MG/2ML IJ SOLN
INTRAMUSCULAR | Status: AC
  Administered 2019-12-28: 1 mg via INTRAVENOUS
  Filled 2019-12-28: qty 2

## 2019-12-28 MED ORDER — LIDOCAINE HCL (PF) 2 % IJ SOLN
INTRAMUSCULAR | Status: AC
  Filled 2019-12-28: qty 5

## 2019-12-28 MED ORDER — GLYCOPYRROLATE 0.2 MG/ML IJ SOLN
INTRAMUSCULAR | Status: AC
  Filled 2019-12-28: qty 1

## 2019-12-28 MED ORDER — ROPIVACAINE HCL 5 MG/ML IJ SOLN
INTRAMUSCULAR | Status: DC | PRN
Start: 2019-12-28 — End: 2019-12-28
  Administered 2019-12-28: 20 mL via PERINEURAL
  Administered 2019-12-28: 10 mL via PERINEURAL

## 2019-12-28 MED ORDER — PROPOFOL 10 MG/ML IV BOLUS
INTRAVENOUS | Status: DC | PRN
  Administered 2019-12-28: 50 mg via INTRAVENOUS

## 2019-12-28 MED ORDER — PROPOFOL 10 MG/ML IV BOLUS
INTRAVENOUS | Status: AC
  Filled 2019-12-28: qty 20

## 2019-12-28 MED ORDER — CHLORHEXIDINE GLUCONATE 0.12 % MT SOLN: 15.0000 mL | Freq: Once | OROMUCOSAL | Status: AC

## 2019-12-28 MED ORDER — FENTANYL CITRATE (PF) 100 MCG/2ML IJ SOLN
INTRAMUSCULAR | Status: AC
  Administered 2019-12-28: 50 ug via INTRAVENOUS
  Filled 2019-12-28: qty 2

## 2019-12-28 MED ORDER — OXYCODONE-ACETAMINOPHEN 5-325 MG PO TABS: 1.0000 | ORAL_TABLET | ORAL | 0 refills | Status: DC | PRN

## 2019-12-28 MED ORDER — METOCLOPRAMIDE HCL 5 MG/ML IJ SOLN: 5.0000 mg | Freq: Three times a day (TID) | INTRAMUSCULAR | Status: DC | PRN

## 2019-12-28 MED ORDER — BUPIVACAINE HCL (PF) 0.5 % IJ SOLN
INTRAMUSCULAR | Status: AC
  Filled 2019-12-28: qty 10

## 2019-12-28 MED ORDER — KETOROLAC TROMETHAMINE 15 MG/ML IJ SOLN
15.0000 mg | Freq: Four times a day (QID) | INTRAMUSCULAR | Status: DC
  Administered 2019-12-28: 15 mg via INTRAVENOUS

## 2019-12-28 MED ORDER — ONDANSETRON HCL 4 MG/2ML IJ SOLN: 4.0000 mg | Freq: Once | INTRAMUSCULAR | Status: DC | PRN

## 2019-12-28 MED ORDER — MORPHINE SULFATE (PF) 2 MG/ML IV SOLN: 0.5000 mg | INTRAVENOUS | Status: DC | PRN

## 2019-12-28 MED ORDER — FENTANYL CITRATE (PF) 100 MCG/2ML IJ SOLN
INTRAMUSCULAR | Status: AC
  Filled 2019-12-28: qty 2

## 2019-12-28 MED ORDER — HYDROCODONE-ACETAMINOPHEN 7.5-325 MG PO TABS
1.0000 | ORAL_TABLET | ORAL | Status: DC | PRN
  Filled 2019-12-28: qty 2

## 2019-12-28 MED ORDER — FENTANYL CITRATE (PF) 100 MCG/2ML IJ SOLN: 25.0000 ug | INTRAMUSCULAR | Status: DC | PRN

## 2019-12-28 MED ORDER — ORAL CARE MOUTH RINSE: 15.0000 mL | Freq: Once | OROMUCOSAL | Status: AC

## 2019-12-28 MED ORDER — ONDANSETRON HCL 4 MG/2ML IJ SOLN: 4.0000 mg | Freq: Four times a day (QID) | INTRAMUSCULAR | Status: DC | PRN

## 2019-12-28 SURGICAL SUPPLY — 58 items
APL PRP STRL LF DISP 70% ISPRP (MISCELLANEOUS) ×2
BIT DRILL 2.5X110 QC LCP DISP (BIT) ×3 IMPLANT
BIT DRILL CANN 2.7X625 NONSTRL (BIT) ×3 IMPLANT
BIT DRILL QC 3.5X110 (BIT) ×3 IMPLANT
BNDG COHESIVE 6X5 TAN STRL LF (GAUZE/BANDAGES/DRESSINGS) ×3 IMPLANT
BNDG ELASTIC 3X5.8 VLCR STR LF (GAUZE/BANDAGES/DRESSINGS) ×3 IMPLANT
BNDG ELASTIC 6X5.8 VLCR STR LF (GAUZE/BANDAGES/DRESSINGS) ×6 IMPLANT
BNDG ESMARK 6X12 TAN STRL LF (GAUZE/BANDAGES/DRESSINGS) ×3 IMPLANT
BRUSH SCRUB EZ  4% CHG (MISCELLANEOUS) ×4
BRUSH SCRUB EZ 4% CHG (MISCELLANEOUS) ×2 IMPLANT
CANISTER SUCT 1200ML W/VALVE (MISCELLANEOUS) ×3 IMPLANT
CHLORAPREP W/TINT 26 (MISCELLANEOUS) ×6 IMPLANT
COVER WAND RF STERILE (DRAPES) ×3 IMPLANT
CUFF TOURN SGL QUICK 24 (TOURNIQUET CUFF) ×3
CUFF TOURN SGL QUICK 30 (TOURNIQUET CUFF)
CUFF TRNQT CYL 24X4X16.5-23 (TOURNIQUET CUFF) ×1 IMPLANT
CUFF TRNQT CYL 30X4X21-28X (TOURNIQUET CUFF) IMPLANT
DRAPE 3/4 80X56 (DRAPES) ×3 IMPLANT
DRAPE FLUOR MINI C-ARM 54X84 (DRAPES) ×3 IMPLANT
ELECT REM PT RETURN 9FT ADLT (ELECTROSURGICAL) ×3
ELECTRODE REM PT RTRN 9FT ADLT (ELECTROSURGICAL) ×1 IMPLANT
GAUZE SPONGE 4X4 12PLY STRL (GAUZE/BANDAGES/DRESSINGS) ×3 IMPLANT
GAUZE XEROFORM 1X8 LF (GAUZE/BANDAGES/DRESSINGS) ×6 IMPLANT
GLOVE BIO SURGEON STRL SZ7.5 (GLOVE) ×3 IMPLANT
GLOVE INDICATOR 8.0 STRL GRN (GLOVE) ×9 IMPLANT
GLOVE SURG ORTHO 8.0 STRL STRW (GLOVE) ×6 IMPLANT
GOWN STRL REUS W/ TWL LRG LVL3 (GOWN DISPOSABLE) ×1 IMPLANT
GOWN STRL REUS W/ TWL XL LVL3 (GOWN DISPOSABLE) ×1 IMPLANT
GOWN STRL REUS W/TWL LRG LVL3 (GOWN DISPOSABLE) ×3
GOWN STRL REUS W/TWL XL LVL3 (GOWN DISPOSABLE) ×3
GUIDEWARE NON THREAD 1.25X150 (WIRE) ×6
GUIDEWIRE NON THREAD 1.25X150 (WIRE) ×2 IMPLANT
KIT TURNOVER KIT A (KITS) ×3 IMPLANT
NS IRRIG 1000ML POUR BTL (IV SOLUTION) ×3 IMPLANT
PACK EXTREMITY (MISCELLANEOUS) ×3 IMPLANT
PAD ABD DERMACEA PRESS 5X9 (GAUZE/BANDAGES/DRESSINGS) ×12 IMPLANT
PAD CAST CTTN 4X4 STRL (SOFTGOODS) ×2 IMPLANT
PADDING CAST COTTON 4X4 STRL (SOFTGOODS) ×6
PLATE BONE COMPRESSION 4-HOLE (Plate) ×3 IMPLANT
SCALPEL PROTECTED #10 DISP (BLADE) IMPLANT
SCALPEL PROTECTED #15 DISP (BLADE) IMPLANT
SCREW CANN L THRD/38 4.0 (Screw) ×6 IMPLANT
SCREW CORT LP ST 3.5X14 (Screw) ×9 IMPLANT
SCREW CORTEX 2.7X14 (Screw) ×3 IMPLANT
SCREW CORTEX LOW PRO 3.5X30 (Screw) ×3 IMPLANT
SCREW LOCK VA ST 2.7X12 (Screw) ×6 IMPLANT
SCREW LOCK VA ST 2.7X14 (Screw) ×3 IMPLANT
SPLINT CAST 1 STEP 4X30 (MISCELLANEOUS) ×6 IMPLANT
SPLINT CAST 1 STEP 5X30 WHT (MISCELLANEOUS) IMPLANT
SPONGE LAP 18X18 RF (DISPOSABLE) ×3 IMPLANT
STAPLER SKIN PROX 35W (STAPLE) ×6 IMPLANT
SUT VIC AB 2-0 CT2 27 (SUTURE) ×6 IMPLANT
SUT VIC AB 3-0 SH 27 (SUTURE) ×3
SUT VIC AB 3-0 SH 27X BRD (SUTURE) ×1 IMPLANT
SYR BULB EAR ULCER 3OZ GRN STR (SYRINGE) ×3 IMPLANT
TAPE SURG TRANSPORE 1 IN (GAUZE/BANDAGES/DRESSINGS) ×1 IMPLANT
TAPE SURGICAL TRANSPORE 1 IN (GAUZE/BANDAGES/DRESSINGS) ×2
TOWEL OR 17X26 4PK STRL BLUE (TOWEL DISPOSABLE) ×3 IMPLANT

## 2019-12-28 NOTE — Anesthesia Procedure Notes (Signed)
Spinal  Patient location during procedure: OR Start time: 12/28/2019 10:38 AM End time: 12/28/2019 10:45 AM Staffing Performed: resident/CRNA  Anesthesiologist: Emmie Niemann, MD Resident/CRNA: Doreen Salvage, CRNA Preanesthetic Checklist Completed: patient identified, IV checked, site marked, risks and benefits discussed, surgical consent, monitors and equipment checked, pre-op evaluation and timeout performed Spinal Block Patient position: sitting Prep: ChloraPrep Patient monitoring: heart rate, continuous pulse ox and blood pressure Approach: midline Location: L4-5 Injection technique: single-shot Needle Needle type: Introducer and Pencil-Tip  Needle gauge: 24 G Needle length: 9 cm Additional Notes Negative paresthesia. Negative blood return. Positive free-flowing CSF. Expiration date of kit checked and confirmed. Patient tolerated procedure well, without complications.

## 2019-12-28 NOTE — Anesthesia Preprocedure Evaluation (Signed)
Anesthesia Evaluation  Patient identified by MRN, date of birth, ID band Patient awake    Reviewed: Allergy & Precautions, NPO status , Patient's Chart, lab work & pertinent test results  History of Anesthesia Complications Negative for: history of anesthetic complications  Airway Mallampati: II  TM Distance: >3 FB Neck ROM: Full    Dental  (+) Implants   Pulmonary neg sleep apnea, former smoker,  IPF on home O2   breath sounds clear to auscultation- rhonchi (-) wheezing      Cardiovascular (-) hypertension(-) CAD, (-) Past MI, (-) Cardiac Stents and (-) CABG  Rhythm:Regular Rate:Normal - Systolic murmurs and - Diastolic murmurs    Neuro/Psych neg Seizures negative neurological ROS  negative psych ROS   GI/Hepatic Neg liver ROS, GERD  ,  Endo/Other  negative endocrine ROSneg diabetes  Renal/GU negative Renal ROS     Musculoskeletal negative musculoskeletal ROS (+)   Abdominal (+) - obese,   Peds  Hematology negative hematology ROS (+)   Anesthesia Other Findings    Reproductive/Obstetrics                             Lab Results  Component Value Date   WBC 16.5 (H) 12/28/2019   HGB 13.1 12/28/2019   HCT 39.8 12/28/2019   MCV 89.8 12/28/2019   PLT 300 12/28/2019    Anesthesia Physical Anesthesia Plan  ASA: III  Anesthesia Plan: Spinal   Post-op Pain Management:  Regional for Post-op pain   Induction:   PONV Risk Score and Plan: 1 and Propofol infusion  Airway Management Planned: Natural Airway  Additional Equipment:   Intra-op Plan:   Post-operative Plan:   Informed Consent: I have reviewed the patients History and Physical, chart, labs and discussed the procedure including the risks, benefits and alternatives for the proposed anesthesia with the patient or authorized representative who has indicated his/her understanding and acceptance.     Dental advisory  given  Plan Discussed with: CRNA and Anesthesiologist  Anesthesia Plan Comments:         Anesthesia Quick Evaluation

## 2019-12-28 NOTE — H&P (Signed)
The patient has been re-examined, and the chart reviewed, and there have been no interval changes to the documented history and physical.  Plan a left ankle open reduction and internal fixation today.  Anesthesia is consulted regarding a peripheral nerve block for post-operative pain.  The risks, benefits, and alternatives have been discussed at length, and the patient is willing to proceed.     

## 2019-12-28 NOTE — Anesthesia Procedure Notes (Signed)
Date/Time: 12/28/2019 10:31 AM Performed by: Stormy Fabian, CRNA Pre-anesthesia Checklist: Patient identified, Emergency Drugs available, Suction available and Patient being monitored Patient Re-evaluated:Patient Re-evaluated prior to induction Oxygen Delivery Method: Simple face mask Induction Type: IV induction Dental Injury: Teeth and Oropharynx as per pre-operative assessment

## 2019-12-28 NOTE — Op Note (Addendum)
  12/28/2019  12:17 PM  PATIENT:  Donald Sellers  69 y.o. male  PRE-OPERATIVE DIAGNOSIS:  S82.62XA Disp fx of lateral malleolus of left fibula, init  POST-OPERATIVE DIAGNOSIS:  Same  PROCEDURE:  OPEN REDUCTION INTERNAL FIXATION (ORIF) ANKLE FRACTURE, TRIMALLEOLAR, LEFT  SURGEON:  Cassell Smiles, MD  ASST:  Altamese Cabal, PA-C  ANESTHESIA:   General  EBL:  20 mL  TOURNIQUET TIME:  45 min at 225 mmHg  OPERATIVE FINDINGS:  Unstable, displaced trimalleolar ankle fracture, left  OPERATIVE PROCEDURE:   The patient was brought to the operating room and placed in the supine position. All bony prominences were padded. General anesthesia was administered. The lower extremity was prepped and draped in the usual sterile fashion. The leg was elevated and exsanguinated and the tourniquet was inflated. Time out was performed.   Incision was made over the distal fibula and the fracture was exposed and reduced anatomically with a clamp. A lag screw was placed. I then applied a synthes LATERAL LOCKING plate and secured it proximally with cortical screws and distally with locking screws.  I used the Fluoroscan to confirm satisfactory reduction and fixation. This wound was irrigated and closed with vicryl sutures and staples.  I then turned my attention to the medial malleolus. Incision was made over the medial malleolus and the fracture exposed and held provisionally with a clamp. 2 guidepins were placed for the 4.0 mm cannulated screws and then confirmation of reduction was made with fluoroscopy. I then placed 2  38 mm screws which had satisfactory fixation. Fluoroscopy showed good reduction and hardware placement.   The syndesmosis was stressed using live fluoroscopy and found to be stable.   The medial wound was irrigated, and closed with vicryl and staples. Sponge and needle counts were correct. Sterile gauze was applied followed by a posterior splint. She was awakened and returned to the PACU in  stable and satisfactory condition. There were no apparent complications.  Cassell Smiles, MD

## 2019-12-28 NOTE — Discharge Instructions (Addendum)
AMBULATORY SURGERY  DISCHARGE INSTRUCTIONS   1) The drugs that you were given will stay in your system until tomorrow so for the next 24 hours you should not:  A) Drive an automobile B) Make any legal decisions C) Drink any alcoholic beverage   2) You may resume regular meals tomorrow.  Today it is better to start with liquids and gradually work up to solid foods.  You may eat anything you prefer, but it is better to start with liquids, then soup and crackers, and gradually work up to solid foods.   3) Please notify your doctor immediately if you have any unusual bleeding, trouble breathing, redness and pain at the surgery site, drainage, fever, or pain not relieved by medication.    Additional Instructions:  YOU ARE NON WEIGHT BEARING TO YOUR LEFT LEG; USE YOUR WALKER FOR SAFETY.                                                KEEP DRESSING CLEAN AND DRY - DO NOT REMOVE.       Please contact your physician with any problems or Same Day Surgery at [phone], Monday through Friday 6 am to 4 pm, or [place] at [facility] number at [phone].Peripheral Nerve Block (Lower Extremity) Discharge Instructions   1.  For your surgery you have received a femoral Nerve Block.  2. Your Nerve Block is expected to last for about 4 to 12 hours.  This is an estimated time frame; the results of your nerve block may wear off sooner or may last longer.  3. If needed, your surgeon will give you a prescription for pain medication.  It will take about 60 minutes for the oral pain medication to become fully effective.  So, it is recommended that you start taking this medication before the nerve block first begins to wear off, or when you first begin to feel discomfort.  4. Keep in mind that nerve blocks often wear off in the middle of the night.   If you are going to bed and the block has not started to wear off or you have not started to have any discomfort, consider setting an alarm for 2 to  3 hours, so you can assess your block.  If you notice the block is wearing off or you are starting to have discomfort, you can take your pain medication.  5. Take your pain medication only as prescribed.  Pain medication can cause sedation and decrease your breathing if you take more than you need for the level of pain that you have.  6. Nausea is a common side effect of many pain medications.  You may want to eat something before taking your pain medicine to prevent nausea.  7. After a Peripheral Nerve block, you cannot feel pain, pressure or extremes in temperature in the effected leg.  Because your leg is numb it is at an increased risk for injury.  To decrease the possibility of injury, please practice the following:  a. While you are awake change the position of your leg frequently to prevent too much pressure on any one area for prolonged periods of time.  b.  If you have a cast or tight dressing, check the color or your toes every couple of hours.  Call your surgeon with the appearance of any discoloration (white or  blue).  c. You may have difficulty bearing weight on the effected leg.  Have someone assist you with walking until the nerve block has completely worn off.   d. If you surgeon prescribed a brace to be worn after surgery, DO NOT GET UP AT NIGHT WITHOUT YOUR BRACE.  e. If your surgeon has restricted the amount of weight you should bear on the effected leg, i.e. No Weight, Partial Weight, or Touch Down Only, DO NOT BEAR MORE WEIGHT THAN INSTRUCTED.   If you experience any problems or concerns, please contact your surgeon.

## 2019-12-28 NOTE — Transfer of Care (Signed)
Immediate Anesthesia Transfer of Care Note  Patient: Donald Sellers  Procedure(s) Performed: OPEN REDUCTION INTERNAL FIXATION (ORIF) ANKLE FRACTURE (Left Ankle)  Patient Location: PACU  Anesthesia Type:Spinal  Level of Consciousness: sedated  Airway & Oxygen Therapy: Patient Spontanous Breathing and Patient connected to face mask oxygen  Post-op Assessment: Report given to RN and Post -op Vital signs reviewed and stable  Post vital signs: Reviewed and stable  Last Vitals:  Vitals Value Taken Time  BP 106/74 12/28/19 1228  Temp 36.2 C 12/28/19 1215  Pulse 87 12/28/19 1229  Resp 24 12/28/19 1229  SpO2 100 % 12/28/19 1229  Vitals shown include unvalidated device data.  Last Pain:  Vitals:   12/28/19 1215  TempSrc: Temporal  PainSc:          Complications: No complications documented.

## 2019-12-28 NOTE — Anesthesia Procedure Notes (Addendum)
Anesthesia Regional Block: Popliteal block   Pre-Anesthetic Checklist: ,, timeout performed, Correct Patient, Correct Site, Correct Laterality, Correct Procedure, Correct Position, site marked, Risks and benefits discussed,  Surgical consent,  Pre-op evaluation,  At surgeon's request and post-op pain management  Laterality: Left  Prep: chloraprep       Needles:  Injection technique: Single-shot  Needle Type: Stimiplex     Needle Length: 9cm  Needle Gauge: 21     Additional Needles:   Procedures:,,,, ultrasound used (permanent image in chart),,,,  Narrative:  Start time: 12/28/2019 10:20 AM End time: 12/28/2019 10:28 AM Injection made incrementally with aspirations every 5 mL.  Performed by: Personally  Anesthesiologist: Alver Fisher, MD  Additional Notes: Functioning IV was confirmed and monitors were applied.  A Stimuplex needle was used. Sterile prep and drape,hand hygiene and sterile gloves were used.  Negative aspiration and negative test dose prior to incremental administration of local anesthetic. The patient tolerated the procedure well.  Supplemental saphenous field block also performed.

## 2019-12-28 NOTE — OR Nursing (Signed)
Spinal anesthesia.  Up to chair postop @ 1400 with assist; tolerated well.  Discharge pending patient void.  Patient/spouse understands non weight bearing status to operative extremity with his walker at home.

## 2019-12-29 ENCOUNTER — Encounter: Payer: Self-pay | Admitting: Orthopedic Surgery

## 2019-12-29 NOTE — Anesthesia Postprocedure Evaluation (Signed)
Anesthesia Post Note  Patient: Donald Sellers  Procedure(s) Performed: OPEN REDUCTION INTERNAL FIXATION (ORIF) ANKLE FRACTURE (Left Ankle)  Patient location during evaluation: PACU Anesthesia Type: Spinal Level of consciousness: awake and alert and oriented Pain management: pain level controlled Vital Signs Assessment: post-procedure vital signs reviewed and stable Respiratory status: spontaneous breathing, nonlabored ventilation and respiratory function stable Cardiovascular status: blood pressure returned to baseline and stable Postop Assessment: no signs of nausea or vomiting Anesthetic complications: no   No complications documented.   Last Vitals:  Vitals:   12/28/19 1438 12/28/19 1519  BP: 108/66 104/68  Pulse: 80 86  Resp: 18 18  Temp:  (!) 36.4 C  SpO2: 99% 100%    Last Pain:  Vitals:   12/29/19 0827  TempSrc:   PainSc: 0-No pain                 Casimir Barcellos

## 2020-01-18 MED FILL — Levofloxacin Tab 750 MG: ORAL | Qty: 750 | Status: AC

## 2020-02-08 ENCOUNTER — Encounter: Payer: Self-pay | Admitting: Emergency Medicine

## 2020-02-08 ENCOUNTER — Emergency Department: Payer: BC Managed Care – PPO

## 2020-02-08 ENCOUNTER — Other Ambulatory Visit: Payer: Self-pay

## 2020-02-08 ENCOUNTER — Inpatient Hospital Stay
Admission: EM | Admit: 2020-02-08 | Discharge: 2020-03-21 | DRG: 870 | Disposition: E | Payer: BC Managed Care – PPO | Attending: Pulmonary Disease | Admitting: Pulmonary Disease

## 2020-02-08 DIAGNOSIS — J984 Other disorders of lung: Secondary | ICD-10-CM | POA: Diagnosis present

## 2020-02-08 DIAGNOSIS — R739 Hyperglycemia, unspecified: Secondary | ICD-10-CM | POA: Diagnosis not present

## 2020-02-08 DIAGNOSIS — R0600 Dyspnea, unspecified: Secondary | ICD-10-CM

## 2020-02-08 DIAGNOSIS — Z79899 Other long term (current) drug therapy: Secondary | ICD-10-CM

## 2020-02-08 DIAGNOSIS — Z978 Presence of other specified devices: Secondary | ICD-10-CM

## 2020-02-08 DIAGNOSIS — I4891 Unspecified atrial fibrillation: Secondary | ICD-10-CM | POA: Diagnosis present

## 2020-02-08 DIAGNOSIS — J84112 Idiopathic pulmonary fibrosis: Secondary | ICD-10-CM | POA: Diagnosis present

## 2020-02-08 DIAGNOSIS — J9601 Acute respiratory failure with hypoxia: Secondary | ICD-10-CM | POA: Diagnosis not present

## 2020-02-08 DIAGNOSIS — I480 Paroxysmal atrial fibrillation: Secondary | ICD-10-CM | POA: Diagnosis not present

## 2020-02-08 DIAGNOSIS — K219 Gastro-esophageal reflux disease without esophagitis: Secondary | ICD-10-CM | POA: Diagnosis present

## 2020-02-08 DIAGNOSIS — R6520 Severe sepsis without septic shock: Secondary | ICD-10-CM | POA: Diagnosis present

## 2020-02-08 DIAGNOSIS — R9389 Abnormal findings on diagnostic imaging of other specified body structures: Secondary | ICD-10-CM

## 2020-02-08 DIAGNOSIS — Z01818 Encounter for other preprocedural examination: Secondary | ICD-10-CM

## 2020-02-08 DIAGNOSIS — J9690 Respiratory failure, unspecified, unspecified whether with hypoxia or hypercapnia: Secondary | ICD-10-CM

## 2020-02-08 DIAGNOSIS — G928 Other toxic encephalopathy: Secondary | ICD-10-CM | POA: Diagnosis not present

## 2020-02-08 DIAGNOSIS — Z9981 Dependence on supplemental oxygen: Secondary | ICD-10-CM

## 2020-02-08 DIAGNOSIS — Z515 Encounter for palliative care: Secondary | ICD-10-CM | POA: Diagnosis not present

## 2020-02-08 DIAGNOSIS — Z87891 Personal history of nicotine dependence: Secondary | ICD-10-CM | POA: Diagnosis not present

## 2020-02-08 DIAGNOSIS — Z66 Do not resuscitate: Secondary | ICD-10-CM | POA: Diagnosis not present

## 2020-02-08 DIAGNOSIS — Z95828 Presence of other vascular implants and grafts: Secondary | ICD-10-CM

## 2020-02-08 DIAGNOSIS — J9600 Acute respiratory failure, unspecified whether with hypoxia or hypercapnia: Secondary | ICD-10-CM

## 2020-02-08 DIAGNOSIS — Z7189 Other specified counseling: Secondary | ICD-10-CM

## 2020-02-08 DIAGNOSIS — J189 Pneumonia, unspecified organism: Secondary | ICD-10-CM | POA: Diagnosis present

## 2020-02-08 DIAGNOSIS — J9621 Acute and chronic respiratory failure with hypoxia: Secondary | ICD-10-CM | POA: Diagnosis present

## 2020-02-08 DIAGNOSIS — J9622 Acute and chronic respiratory failure with hypercapnia: Secondary | ICD-10-CM | POA: Diagnosis present

## 2020-02-08 DIAGNOSIS — I469 Cardiac arrest, cause unspecified: Secondary | ICD-10-CM | POA: Diagnosis not present

## 2020-02-08 DIAGNOSIS — A419 Sepsis, unspecified organism: Principal | ICD-10-CM | POA: Diagnosis present

## 2020-02-08 DIAGNOSIS — J8410 Pulmonary fibrosis, unspecified: Secondary | ICD-10-CM | POA: Diagnosis present

## 2020-02-08 DIAGNOSIS — Z20822 Contact with and (suspected) exposure to covid-19: Secondary | ICD-10-CM | POA: Diagnosis present

## 2020-02-08 HISTORY — DX: Pulmonary fibrosis, unspecified: J84.10

## 2020-02-08 LAB — COMPREHENSIVE METABOLIC PANEL
ALT: 28 U/L (ref 0–44)
AST: 31 U/L (ref 15–41)
Albumin: 3.3 g/dL — ABNORMAL LOW (ref 3.5–5.0)
Alkaline Phosphatase: 112 U/L (ref 38–126)
Anion gap: 12 (ref 5–15)
BUN: 8 mg/dL (ref 8–23)
CO2: 26 mmol/L (ref 22–32)
Calcium: 8.9 mg/dL (ref 8.9–10.3)
Chloride: 95 mmol/L — ABNORMAL LOW (ref 98–111)
Creatinine, Ser: 0.63 mg/dL (ref 0.61–1.24)
GFR calc Af Amer: >60 mL/min (ref >60)
GFR calc non Af Amer: >60 mL/min (ref >60)
Glucose, Bld: 132 mg/dL — ABNORMAL HIGH (ref 70–99)
Potassium: 3.8 mmol/L (ref 3.5–5.1)
Sodium: 133 mmol/L — ABNORMAL LOW (ref 135–145)
Total Bilirubin: 1.1 mg/dL (ref 0.3–1.2)
Total Protein: 7.9 g/dL (ref 6.5–8.1)

## 2020-02-08 LAB — CBC WITH DIFFERENTIAL/PLATELET
Abs Immature Granulocytes: 0.23 10*3/uL — ABNORMAL HIGH (ref 0.00–0.07)
Basophils Absolute: 0.3 10*3/uL — ABNORMAL HIGH (ref 0.0–0.1)
Basophils Relative: 2 %
Eosinophils Absolute: 0.4 10*3/uL (ref 0.0–0.5)
Eosinophils Relative: 2 %
HCT: 39.8 % (ref 39.0–52.0)
Hemoglobin: 13.4 g/dL (ref 13.0–17.0)
Immature Granulocytes: 1 %
Lymphocytes Relative: 9 %
Lymphs Abs: 1.6 10*3/uL (ref 0.7–4.0)
MCH: 29.3 pg (ref 26.0–34.0)
MCHC: 33.7 g/dL (ref 30.0–36.0)
MCV: 87.1 fL (ref 80.0–100.0)
Monocytes Absolute: 1.7 10*3/uL — ABNORMAL HIGH (ref 0.1–1.0)
Monocytes Relative: 9 %
Neutro Abs: 13.8 10*3/uL — ABNORMAL HIGH (ref 1.7–7.7)
Neutrophils Relative %: 77 %
Platelets: 432 10*3/uL — ABNORMAL HIGH (ref 150–400)
RBC: 4.57 MIL/uL (ref 4.22–5.81)
RDW: 13.4 % (ref 11.5–15.5)
WBC: 18 10*3/uL — ABNORMAL HIGH (ref 4.0–10.5)
nRBC: 0 % (ref 0.0–0.2)

## 2020-02-08 LAB — APTT: aPTT: 31 seconds (ref 24–36)

## 2020-02-08 LAB — TROPONIN I (HIGH SENSITIVITY): Troponin I (High Sensitivity): 10 ng/L (ref <18)

## 2020-02-08 LAB — PROTIME-INR
INR: 1.2 (ref 0.8–1.2)
Prothrombin Time: 14.9 seconds (ref 11.4–15.2)

## 2020-02-08 LAB — LACTIC ACID, PLASMA: Lactic Acid, Venous: 1.5 mmol/L (ref 0.5–1.9)

## 2020-02-08 LAB — SARS CORONAVIRUS 2 BY RT PCR (HOSPITAL ORDER, PERFORMED IN ~~LOC~~ HOSPITAL LAB): SARS Coronavirus 2: NEGATIVE

## 2020-02-08 MED ORDER — SODIUM CHLORIDE 0.9 % IV SOLN: 500.0000 mg | INTRAVENOUS | Status: DC

## 2020-02-08 MED ORDER — METHYLPREDNISOLONE SODIUM SUCC 125 MG IJ SOLR
125.0000 mg | Freq: Once | INTRAMUSCULAR | Status: AC
  Administered 2020-02-08: 125 mg via INTRAVENOUS
  Filled 2020-02-08: qty 2

## 2020-02-08 MED ORDER — SODIUM CHLORIDE 0.9 % IV SOLN: 2.0000 g | INTRAVENOUS | Status: DC

## 2020-02-08 MED ORDER — SODIUM CHLORIDE 0.9 % IV BOLUS (SEPSIS)
1000.0000 mL | Freq: Once | INTRAVENOUS | Status: AC
  Administered 2020-02-08: 1000 mL via INTRAVENOUS

## 2020-02-08 MED ORDER — SODIUM CHLORIDE 0.9 % IV SOLN
2.0000 g | Freq: Once | INTRAVENOUS | Status: AC
  Administered 2020-02-08: 2 g via INTRAVENOUS
  Filled 2020-02-08: qty 2

## 2020-02-08 MED ORDER — LEVOFLOXACIN IN D5W 750 MG/150ML IV SOLN
750.0000 mg | INTRAVENOUS | Status: DC
  Administered 2020-02-08: 750 mg via INTRAVENOUS
  Filled 2020-02-08 (×2): qty 150

## 2020-02-08 MED ORDER — SODIUM CHLORIDE 0.9 % IV SOLN
500.0000 mg | Freq: Once | INTRAVENOUS | Status: AC
  Administered 2020-02-08: 500 mg via INTRAVENOUS
  Filled 2020-02-08: qty 500

## 2020-02-08 MED ORDER — METHYLPREDNISOLONE SODIUM SUCC 125 MG IJ SOLR
80.0000 mg | Freq: Three times a day (TID) | INTRAMUSCULAR | Status: DC
  Administered 2020-02-08 – 2020-02-10 (×6): 80 mg via INTRAVENOUS
  Filled 2020-02-08 (×6): qty 2

## 2020-02-08 MED ORDER — IPRATROPIUM-ALBUTEROL 0.5-2.5 (3) MG/3ML IN SOLN: 3.0000 mL | Freq: Four times a day (QID) | RESPIRATORY_TRACT | Status: DC | PRN

## 2020-02-08 MED ORDER — VANCOMYCIN HCL IN DEXTROSE 1-5 GM/200ML-% IV SOLN
1000.0000 mg | Freq: Once | INTRAVENOUS | Status: AC
  Administered 2020-02-08: 1000 mg via INTRAVENOUS
  Filled 2020-02-08: qty 200

## 2020-02-08 MED ORDER — IOHEXOL 350 MG/ML SOLN
75.0000 mL | Freq: Once | INTRAVENOUS | Status: AC | PRN
  Administered 2020-02-08: 75 mL via INTRAVENOUS

## 2020-02-08 MED ORDER — ENOXAPARIN SODIUM 40 MG/0.4ML ~~LOC~~ SOLN
40.0000 mg | SUBCUTANEOUS | Status: DC
  Administered 2020-02-08 – 2020-02-10 (×3): 40 mg via SUBCUTANEOUS
  Filled 2020-02-08 (×3): qty 0.4

## 2020-02-08 MED ORDER — METHYLPREDNISOLONE SODIUM SUCC 40 MG IJ SOLR: 40.0000 mg | Freq: Two times a day (BID) | INTRAMUSCULAR | Status: DC

## 2020-02-08 MED ORDER — SODIUM CHLORIDE 0.9 % IV SOLN: INTRAVENOUS | Status: DC

## 2020-02-08 MED ORDER — ALPRAZOLAM 0.5 MG PO TABS
1.0000 mg | ORAL_TABLET | Freq: Every day | ORAL | Status: DC
  Administered 2020-02-08 – 2020-02-10 (×3): 1 mg via ORAL
  Filled 2020-02-08 (×3): qty 2

## 2020-02-08 NOTE — H&P (Signed)
History and Physical    Donald Sellers OIN:[id number] DOB: January 31, 1951 DOA: 02/03/2020  PCP: Patient, No Pcp Per   Patient coming from: Home  I have personally briefly reviewed patient's old medical records in [facility]  Chief Complaint: Shortness of breath  HPI: Donald Sellers is a 69 y.o. male with medical history significant for pulmonary fibrosis on 4 L of oxygen continuous who presents to the emergency room for evaluation of worsening shortness of breath from his baseline.  He has a cough productive of greenish phlegm but denies having any fever or chills.  He has no sick contacts.  Patient states that over the last 24 hours his pulse oximetry on 4 L has been dropping to the high 70s and low 80s with any form of exertion.  He is currently on a nonrebreather mask with pulse oximetry of about 92%. He denies having any chest pain, no nausea, no vomiting, no diaphoresis or palpitations, no urinary symptoms or changes in his bowel habits. Patient was recently diagnosed with pulmonary fibrosis in July and states that he has had worsening dyspnea on exertion over the last 1 year. Labs show sodium 133, potassium 3.8, chloride 95, bicarb 26, BUN 8, creatinine 0.63, calcium 8.9, AST 31, ALT 28, total protein 7.9, lactic acid 1.5, white count 18, hemoglobin 13.4, hematocrit 39.8, MCV 87.1, RDW 13.4, platelet count 432 Patient's COVID-19 PCR test is negative CT angiogram of the chest shows no evidence of  Pulmonary embolism.Progression of extensive bilateral interstitial and consolidative opacities, even compared to recent chest CT performed 12/11/2019, worrisome for progressive fibrotic lung disease, nonspecific though suggestive of UIP given probable honeycombing within the right lower lobe. Improved aeration of previously noted consolidative opacities within the left upper lobe suggestive of resolved infection and/or Inflammation. Redemonstrated mediastinal and bilateral hilar  lymphadenopathy, nonspecific though presumably reactive in etiology. Chest x-ray reviewed by me shows widespread fibrosis Twelve-lead EKG reviewed by me shows sinus tachycardia   ED Course: Patient is a 69 year old Caucasian male with a history of pulmonary fibrosis and chronic respiratory failure on 4 L of oxygen who presents to the ER for evaluation of worsening shortness of breath from his baseline and drop in his pulse oximetry on 4 L of oxygen with any form of exertion.  He has a cough productive of greenish phlegm.  On 4 L patient had pulse oximetry in the low 80s and is currently on a nonrebreather mask.  He has marked leukocytosis with a left shift and imaging is suggestive of possible pneumonia.  He received a dose of vancomycin and cefepime as well as IV Solu-Medrol in the ER.  He will be admitted to the hospital for further evaluation  Review of Systems: As per HPI otherwise 10 point review of systems negative.    Past Medical History:  Diagnosis Date  . Patient denies medical problems   . Pulmonary fibrosis (HCC)     Past Surgical History:  Procedure Laterality Date  . EYE SURGERY    . ORIF ANKLE FRACTURE Left 12/28/2019   Procedure: OPEN REDUCTION INTERNAL FIXATION (ORIF) ANKLE FRACTURE;  Surgeon: Lyndle Herrlich, MD;  Location: [facility] ORS;  Service: Orthopedics;  Laterality: Left;     reports that he quit smoking about 41 years ago. His smoking use included cigarettes. He has a 11.00 pack-year smoking history. He has never used smokeless tobacco. He reports current alcohol use. He reports that he does not use drugs.  No Known Allergies  Family  History  Problem Relation Age of Onset  . Pulmonary fibrosis Neg Hx      Prior to Admission medications   Medication Sig Start Date End Date Taking? Authorizing Provider  oxyCODONE-acetaminophen (PERCOCET) 5-325 MG tablet Take 1 tablet by mouth every 4 (four) hours as needed for severe pain. 12/28/19 12/27/20  Lyndle HerrlichBowers, James R, MD    pantoprazole (PROTONIX) 40 MG tablet Take 1 tablet (40 mg total) by mouth 2 (two) times daily. 12/17/19 03/16/20  Darlin PriestlyLai, Tina, MD  sodium chloride (OCEAN) 0.65 % SOLN nasal spray Place 2 sprays into both nostrils every 2 (two) hours while awake. Patient not taking: Reported on 12/19/2019 12/11/19 01/10/20  Barbaraann BarthelBetancourt, Tina A, NP    Physical Exam: Vitals:   2019-11-09 1030 2019-11-09 1100 2019-11-09 1105 2019-11-09 1300  BP: 104/69 (!) 128/109  109/70  Pulse: (!) 114 (!) 114  (!) 108  Resp: (!) 26 18  (!) 31  Temp:      TempSrc:      SpO2: 94% 98% 97% 98%  Weight:      Height:         Vitals:   2019-11-09 1030 2019-11-09 1100 2019-11-09 1105 2019-11-09 1300  BP: 104/69 (!) 128/109  109/70  Pulse: (!) 114 (!) 114  (!) 108  Resp: (!) 26 18  (!) 31  Temp:      TempSrc:      SpO2: 94% 98% 97% 98%  Weight:      Height:        Constitutional: NAD, alert and oriented x 3.  Appears to be in moderate respiratory distress Eyes: PERRL, lids and conjunctivae pallor ENMT: Mucous membranes are moist.  Neck: normal, supple, no masses, no thyromegaly Respiratory: Faint rhonchi, no wheezing, faint crackles.  Increased work of breathing Cardiovascular: Tachycardic, no murmurs / rubs / gallops. No extremity edema. 2+ pedal pulses. No carotid bruits.  Abdomen: no tenderness, no masses palpated. No hepatosplenomegaly. Bowel sounds positive.  Musculoskeletal: no clubbing / cyanosis. No joint deformity upper and lower extremities.  Skin: no rashes, lesions, ulcers.  Neurologic: No gross focal neurologic deficit. Psychiatric: Normal mood and affect.   Labs on Admission: I have personally reviewed following labs and imaging studies  CBC: Recent Labs  Lab 2019-11-09 0754  WBC 18.0*  NEUTROABS 13.8*  HGB 13.4  HCT 39.8  MCV 87.1  PLT 432*   Basic Metabolic Panel: Recent Labs  Lab 2019-11-09 0754  NA 133*  K 3.8  CL 95*  CO2 26  GLUCOSE 132*  BUN 8  CREATININE 0.63  CALCIUM 8.9   GFR: Estimated  Creatinine Clearance: 92.5 mL/min (by C-G formula based on SCr of 0.63 mg/dL). Liver Function Tests: Recent Labs  Lab 2019-11-09 0754  AST 31  ALT 28  ALKPHOS 112  BILITOT 1.1  PROT 7.9  ALBUMIN 3.3*   No results for input(s): LIPASE, AMYLASE in the last 168 hours. No results for input(s): AMMONIA in the last 168 hours. Coagulation Profile: Recent Labs  Lab 2019-11-09 0754  INR 1.2   Cardiac Enzymes: No results for input(s): CKTOTAL, CKMB, CKMBINDEX, TROPONINI in the last 168 hours. BNP (last 3 results) No results for input(s): PROBNP in the last 8760 hours. HbA1C: No results for input(s): HGBA1C in the last 72 hours. CBG: No results for input(s): GLUCAP in the last 168 hours. Lipid Profile: No results for input(s): CHOL, HDL, LDLCALC, TRIG, CHOLHDL, LDLDIRECT in the last 72 hours. Thyroid Function Tests: No results for input(s): TSH,  T4TOTAL, FREET4, T3FREE, THYROIDAB in the last 72 hours. Anemia Panel: No results for input(s): VITAMINB12, FOLATE, FERRITIN, TIBC, IRON, RETICCTPCT in the last 72 hours. Urine analysis:    Component Value Date/Time   COLORURINE YELLOW (A) 12/11/2019 2052   APPEARANCEUR CLEAR (A) 12/11/2019 2052   LABSPEC >1.046 (H) 12/11/2019 2052   PHURINE 6.0 12/11/2019 2052   GLUCOSEU NEGATIVE 12/11/2019 2052   HGBUR NEGATIVE 12/11/2019 2052   BILIRUBINUR NEGATIVE 12/11/2019 2052   KETONESUR 20 (A) 12/11/2019 2052   PROTEINUR NEGATIVE 12/11/2019 2052   NITRITE NEGATIVE 12/11/2019 2052   LEUKOCYTESUR NEGATIVE 12/11/2019 2052    Radiological Exams on Admission: CT Angio Chest PE W and/or Wo Contrast  Result Date: 02/05/2020 CLINICAL DATA:  Shortness of breath. Evaluate for pulmonary embolism. EXAM: CT ANGIOGRAPHY CHEST WITH CONTRAST TECHNIQUE: Multidetector CT imaging of the chest was performed using the standard protocol during bolus administration of intravenous contrast. Multiplanar CT image reconstructions and MIPs were obtained to evaluate the  vascular anatomy. CONTRAST:  67mL OMNIPAQUE IOHEXOL 350 MG/ML SOLN COMPARISON:  Chest CT-12/11/2019; chest radiograph-earlier same day; 12/16/2019 FINDINGS: Vascular Findings: There is adequate opacification of the pulmonary arterial system with the main pulmonary artery measuring 303 Hounsfield units. There are no discrete filling defects within the pulmonary arterial tree to suggest pulmonary embolism. Borderline enlarged caliber the main pulmonary artery measuring 3.3 cm in diameter (image 44, series 4). Cardiomegaly. No pericardial effusion. Coronary artery calcifications. No evidence of thoracic aortic aneurysm or dissection. Conventional configuration of the aortic arch. The branch vessels of the aortic arch appear patent throughout their imaged courses. There is a minimal amount of atherosclerotic plaque within the descending thoracic aorta, not resulting in a hemodynamically significant stenosis. Review of the MIP images confirms the above findings. ---------------------------------------------------------------------------------- Nonvascular Findings: Mediastinum/Lymph Nodes: Redemonstrated mediastinal and bilateral hilar lymphadenopathy with index high right posterior paratracheal lymph node measuring 1.1 cm in greatest short axis diameter image 20, series 4), index precarinal lymph node measuring 1.0 cm (image 36), index left suprahilar lymph node measuring 1.2 cm (image 41), index left infrahilar lymph node measuring 1.4 cm (image 52), and index right infrahilar lymph node measuring 1.4 cm (image 58), grossly unchanged compared to the 12/11/2019 examination. No axillary lymphadenopathy. Lungs/Pleura: Improved aeration of the previously noted consolidative opacities within left upper lobe suggestive resolved infection and/or inflammation. Interval worsening extensive though basilar predominant interstitial and now consolidative airspace opacities with associated air bronchograms, progressed even compared  to recent chest CT performed 11/2019. There is mild sparing of the left lung apex, with otherwise extensive involvement of the bilateral lungs with basilar predominance and questionable honeycombing within the subpleural aspect of the right lower lobe (representative image 64, series 6). No pleural effusion or pneumothorax. The central pulmonary airways appear patent. There is a punctate granuloma within the right lower lobe (image 69, series 6), unchanged. Evaluation for additional discrete pulmonary nodules is degraded secondary to extensive background parenchymal abnormalities. Upper abdomen: Limited early arterial phase evaluation of the upper abdomen demonstrates a splenule about the anterior tip of the spleen. Musculoskeletal: No acute or aggressive osseous abnormalities. Stigmata of dish within the thoracic spine. IMPRESSION: 1. No evidence of pulmonary embolism. 2. Progression of extensive bilateral interstitial and consolidative opacities, even compared to recent chest CT performed 12/11/2019, worrisome for progressive fibrotic lung disease, nonspecific though suggestive of UIP given probable honeycombing within the right lower lobe. 3. Improved aeration of previously noted consolidative opacities within the left upper lobe suggestive of resolved  infection and/or inflammation. 4. Redemonstrated mediastinal and bilateral hilar lymphadenopathy, nonspecific though presumably reactive in etiology. Electronically Signed   By: Simonne Come M.D.   On: 03-06-20 10:37   DG Chest Portable 1 View  Result Date: 03-06-2020 CLINICAL DATA:  Shortness of breath EXAM: PORTABLE CHEST 1 VIEW COMPARISON:  December 16, 2019 FINDINGS: Widespread fibrosis remains. No new opacity is appreciable. Heart is upper normal in size with pulmonary vascularity normal. No adenopathy. No bone lesions IMPRESSION: Widespread fibrosis, essentially stable. It is difficult to exclude a degree of underlying emphysematous change. The appearance is  essentially stable compared to prior study. No consolidation evident. Heart upper normal in size. Electronically Signed   By: Bretta Bang III M.D.   On: 2020/03/06 07:55    EKG: Independently reviewed.  Sinus tachycardia  Assessment/Plan Principal Problem:   Sepsis (HCC) Active Problems:   Gastroesophageal reflux disease   CAP (community acquired pneumonia)   Pulmonary fibrosis (HCC)   Acute on chronic respiratory failure with hypoxia (HCC)    Sepsis (POA) As evidenced by tachycardia, tachypnea, leukocytosis with a left shift and imaging suggestive of worsening consolidative opacities Patient has a history of pulmonary fibrosis and presents with worsening shortness of breath, cough productive of greenish phlegm and worsening hypoxia We will place patient on Levaquin 750 mg IV daily IV fluid hydration Start patient on Solu-Medrol 40 mg IV every 12 We will request pulmonology consult Follow-up results of blood and sputum cultures    Acute on chronic respiratory failure with hypoxia Patient has a history of chronic respiratory failure and is on 4 L of oxygen continuous He is currently on a nonrebreather mask to maintain pulse oximetry greater than 92% Worsening respiratory failure appears to be secondary to community-acquired pneumonia We will attempt to wean patient down to his baseline home oxygen requirement once his acute illness improves    Community-acquired pneumonia Continue Levaquin   GERD Continue PPI   DVT prophylaxis: Lovenox Code Status: Full code Family Communication: Greater than 50% of time was spent discussing plan of care with patient at the bedside.  All questions and concerns have been addressed.  He verbalizes understanding and agrees with the plan. Disposition Plan: Back to previous home environment Consults called: Pulmonology    Enrique Weiss MD [facility]     03-06-2020, 1:33 PM

## 2020-02-08 NOTE — Progress Notes (Signed)
CODE SEPSIS - PHARMACY COMMUNICATION  **Broad Spectrum Antibiotics should be administered within 1 hour of Sepsis diagnosis**  Time Code Sepsis Called/Page Received: 9381  Antibiotics Ordered: 0811  Time of 1st antibiotic administration: 0826  Additional action taken by pharmacy: additional antibiotic ordered to broaden coverage  If necessary, Name of Provider/Nurse Contacted: n/a    Doroteo Glassman, PharmD Clinical Pharmacist  01/22/2020  8:30 AM

## 2020-02-08 NOTE — ED Notes (Signed)
Patient transported to CT 

## 2020-02-08 NOTE — ED Notes (Signed)
Hx pulmonary fibrosis, had increase in [facility] since last night. Does have boot to left foot for recent break.  No pain.

## 2020-02-08 NOTE — Consult Note (Signed)
Pulmonary Medicine          Date: 01/27/2020,   MRN# [id number] Donald Sellers 10-Aug-1950     AdmissionWeight: 77.9 kg                 CurrentWeight: 77.9 kg  Referring physician: Dr Joylene Igo   CHIEF COMPLAINT:   Acute hypoxemic respiratory failure   HISTORY OF PRESENT ILLNESS   14M with no PMH came in with chief complaint of SOB/DOE 2d. He had noted worsening cough and phlegm on expectoration when at home. He reports his Oxygen therapy had stopped working and at 2 am he was on phone with staff attempting to fix his poc. He was able to use oxygen tank. He was asmoker in past. He was found to be acutely hypoxemic but shares that DOE has been progressively getting worse over past 1 year.Initial sat 79% on RA, was placed on 6L [place].  Afebrile, pulse 126, BP 151/92.  Labs notable for WBC 18, Hgb wnl, BNP 151, trop 20 and 20, lactic acid wnl, procal <0.1, COVID neg, CXR showed Extensive bilateral interstitial opacities throughout both lungs. CTA neg for PE, but showed Right-sided volume loss with subpleural architectural distortion/honeycombing. Extensive superimposed ground-glass pulmonary infiltrate and Superimposed consolidation within the left upper lobe.  Pt received ceftriaxone, azithromycin and IV deca 10 mg in the ED before admission.  Pulmonary consultation for further management of fibrotic lung disease with acute hypoxemia. CTPE on this admission is negative with    PAST MEDICAL HISTORY   Past Medical History:  Diagnosis Date  . Patient denies medical problems   . Pulmonary fibrosis (HCC)      SURGICAL HISTORY   Past Surgical History:  Procedure Laterality Date  . EYE SURGERY    . ORIF ANKLE FRACTURE Left 12/28/2019   Procedure: OPEN REDUCTION INTERNAL FIXATION (ORIF) ANKLE FRACTURE;  Surgeon: Lyndle Herrlich, MD;  Location: [facility] ORS;  Service: Orthopedics;  Laterality: Left;     FAMILY HISTORY   Family History  Problem Relation Age of Onset  . Pulmonary  fibrosis Neg Hx      SOCIAL HISTORY   Social History   Tobacco Use  . Smoking status: Former Smoker    Packs/day: 1.00    Years: 11.00    Pack years: 11.00    Types: Cigarettes    Quit date: 1980    Years since quitting: 41.7  . Smokeless tobacco: Never Used  Substance Use Topics  . Alcohol use: Yes  . Drug use: Never     MEDICATIONS    Home Medication:  Current Outpatient Rx  . Order #: [id number] Class: Historical Med  . Order #: [id number] Class: Normal    Current Medication:  Current Facility-Administered Medications:  .  0.9 %  sodium chloride infusion, , Intravenous, Continuous, Agbata, Tochukwu, MD, Last Rate: 100 mL/hr at 02/03/2020 1140, New Bag at 02/03/2020 1140 .  [START ON 02/09/2020] azithromycin (ZITHROMAX) 500 mg in sodium chloride 0.9 % 250 mL IVPB, 500 mg, Intravenous, Q24H, Agbata, Tochukwu, MD .  cefTRIAXone (ROCEPHIN) 2 g in sodium chloride 0.9 % 100 mL IVPB, 2 g, Intravenous, Q24H, Agbata, Tochukwu, MD .  enoxaparin (LOVENOX) injection 40 mg, 40 mg, Subcutaneous, Q24H, Agbata, Tochukwu, MD  Current Outpatient Medications:  .  montelukast (SINGULAIR) 10 MG tablet, Take 10 mg by mouth at bedtime., Disp: , Rfl:  .  pantoprazole (PROTONIX) 40 MG tablet, Take 1 tablet (40 mg total) by mouth 2 (two) times  daily., Disp: 60 tablet, Rfl: 2    ALLERGIES   Patient has no known allergies.     REVIEW OF SYSTEMS    Review of Systems:  Gen:  Denies  fever, sweats, chills weigh loss  HEENT: Denies blurred vision, double vision, ear pain, eye pain, hearing loss, nose bleeds, sore throat Cardiac:  No dizziness, chest pain or heaviness, chest tightness,edema Resp:   Denies cough or sputum porduction, shortness of breath,wheezing, hemoptysis,  Gi: Denies swallowing difficulty, stomach pain, nausea or vomiting, diarrhea, constipation, bowel incontinence Gu:  Denies bladder incontinence, burning urine Ext:   Denies Joint pain, stiffness or swelling Skin: Denies   skin rash, easy bruising or bleeding or hives Endoc:  Denies polyuria, polydipsia , polyphagia or weight change Psych:   Denies depression, insomnia or hallucinations   Other:  All other systems negative   VS: BP 109/70   Pulse (!) 108   Temp 98.6 F (37 C) (Oral)   Resp (!) 31   Ht 5\' 10"  (1.778 m)   Wt 77.9 kg   SpO2 98%   BMI 24.64 kg/m      PHYSICAL EXAM    GENERAL:NAD, no fevers, chills, no weakness no fatigue HEAD: Normocephalic, atraumatic.  EYES: Pupils equal, round, reactive to light. Extraocular muscles intact. No scleral icterus.  MOUTH: Moist mucosal membrane. Dentition intact. No abscess noted.  EAR, NOSE, THROAT: Clear without exudates. No external lesions.  NECK: Supple. No thyromegaly. No nodules. No JVD.  PULMONARY: Diffuse coarse rhonchi right sided +wheezes CARDIOVASCULAR: S1 and S2. Regular rate and rhythm. No murmurs, rubs, or gallops. No edema. Pedal pulses 2+ bilaterally.  GASTROINTESTINAL: Soft, nontender, nondistended. No masses. Positive bowel sounds. No hepatosplenomegaly.  MUSCULOSKELETAL: No swelling, clubbing, or edema. Range of motion full in all extremities.  NEUROLOGIC: Cranial nerves II through XII are intact. No gross focal neurological deficits. Sensation intact. Reflexes intact.  SKIN: No ulceration, lesions, rashes, or cyanosis. Skin warm and dry. Turgor intact.  PSYCHIATRIC: Mood, affect within normal limits. The patient is awake, alert and oriented x 3. Insight, judgment intact.          IMAGING    CT Angio Chest PE W and/or Wo Contrast  Result Date: 01/25/2020 CLINICAL DATA:  Shortness of breath. Evaluate for pulmonary embolism. EXAM: CT ANGIOGRAPHY CHEST WITH CONTRAST TECHNIQUE: Multidetector CT imaging of the chest was performed using the standard protocol during bolus administration of intravenous contrast. Multiplanar CT image reconstructions and MIPs were obtained to evaluate the vascular anatomy. CONTRAST:  18mL OMNIPAQUE  IOHEXOL 350 MG/ML SOLN COMPARISON:  Chest CT-12/11/2019; chest radiograph-earlier same day; 12/16/2019 FINDINGS: Vascular Findings: There is adequate opacification of the pulmonary arterial system with the main pulmonary artery measuring 303 Hounsfield units. There are no discrete filling defects within the pulmonary arterial tree to suggest pulmonary embolism. Borderline enlarged caliber the main pulmonary artery measuring 3.3 cm in diameter (image 44, series 4). Cardiomegaly. No pericardial effusion. Coronary artery calcifications. No evidence of thoracic aortic aneurysm or dissection. Conventional configuration of the aortic arch. The branch vessels of the aortic arch appear patent throughout their imaged courses. There is a minimal amount of atherosclerotic plaque within the descending thoracic aorta, not resulting in a hemodynamically significant stenosis. Review of the MIP images confirms the above findings. ---------------------------------------------------------------------------------- Nonvascular Findings: Mediastinum/Lymph Nodes: Redemonstrated mediastinal and bilateral hilar lymphadenopathy with index high right posterior paratracheal lymph node measuring 1.1 cm in greatest short axis diameter image 20, series 4), index precarinal lymph node  measuring 1.0 cm (image 36), index left suprahilar lymph node measuring 1.2 cm (image 41), index left infrahilar lymph node measuring 1.4 cm (image 52), and index right infrahilar lymph node measuring 1.4 cm (image 58), grossly unchanged compared to the 12/11/2019 examination. No axillary lymphadenopathy. Lungs/Pleura: Improved aeration of the previously noted consolidative opacities within left upper lobe suggestive resolved infection and/or inflammation. Interval worsening extensive though basilar predominant interstitial and now consolidative airspace opacities with associated air bronchograms, progressed even compared to recent chest CT performed 11/2019. There  is mild sparing of the left lung apex, with otherwise extensive involvement of the bilateral lungs with basilar predominance and questionable honeycombing within the subpleural aspect of the right lower lobe (representative image 64, series 6). No pleural effusion or pneumothorax. The central pulmonary airways appear patent. There is a punctate granuloma within the right lower lobe (image 69, series 6), unchanged. Evaluation for additional discrete pulmonary nodules is degraded secondary to extensive background parenchymal abnormalities. Upper abdomen: Limited early arterial phase evaluation of the upper abdomen demonstrates a splenule about the anterior tip of the spleen. Musculoskeletal: No acute or aggressive osseous abnormalities. Stigmata of dish within the thoracic spine. IMPRESSION: 1. No evidence of pulmonary embolism. 2. Progression of extensive bilateral interstitial and consolidative opacities, even compared to recent chest CT performed 12/11/2019, worrisome for progressive fibrotic lung disease, nonspecific though suggestive of UIP given probable honeycombing within the right lower lobe. 3. Improved aeration of previously noted consolidative opacities within the left upper lobe suggestive of resolved infection and/or inflammation. 4. Redemonstrated mediastinal and bilateral hilar lymphadenopathy, nonspecific though presumably reactive in etiology. Electronically Signed   By: Simonne Come M.D.   On: 08-Mar-2020 10:37   DG Chest Portable 1 View  Result Date: 03/08/20 CLINICAL DATA:  Shortness of breath EXAM: PORTABLE CHEST 1 VIEW COMPARISON:  December 16, 2019 FINDINGS: Widespread fibrosis remains. No new opacity is appreciable. Heart is upper normal in size with pulmonary vascularity normal. No adenopathy. No bone lesions IMPRESSION: Widespread fibrosis, essentially stable. It is difficult to exclude a degree of underlying emphysematous change. The appearance is essentially stable compared to prior study.  No consolidation evident. Heart upper normal in size. Electronically Signed   By: Bretta Bang III M.D.   On: 03-08-2020 07:55      ASSESSMENT/PLAN   Acute exacerbation of Idiopathic pulmonary fibosis   - there is right upper lobe consolidation suggestive of acute infectious cause of current exacerbation   - will obtain serology for ANA comprehensive, cryptococcal antigen, aspergillus ab, fungitell   - Respiratory viral panel    - sputum bacterial cultures   - procalcitonin trend   - MRSA PCR   - empiric IV dose steroids   -patient worked up in clinic and has antifibrotic therapy done-esbriet approval in progress   GERD   -this should be very tightly controlled while in acute exacerbation of IPF - will start protonix 40 bid     Thank you for allowing me to participate in the care of this patient.   Patient/Family are satisfied with care plan and all questions have been answered.  This document was prepared using Dragon voice recognition software and may include unintentional dictation errors.     Vida Rigger, M.D.  Division of Pulmonary & Critical Care Medicine  [facility]

## 2020-02-08 NOTE — ED Triage Notes (Signed)
Pt arrived [facility].  [place] 4 L at baseline.  Labored.

## 2020-02-08 NOTE — Progress Notes (Signed)
PHARMACY -  BRIEF ANTIBIOTIC NOTE   Pharmacy has received consult(s) for vancomycin and cefepime from an ED provider.  The patient's profile has been reviewed for ht/wt/allergies/indication/available labs.    One time order(s) placed for vanc 1 g + cefepime 2 g  Further antibiotics/pharmacy consults should be ordered by admitting physician if indicated.                       Thank you,  Pricilla Riffle, PharmD 02-09-2020  8:22 AM

## 2020-02-08 NOTE — ED Provider Notes (Signed)
[facility] Emergency Department Provider Note  ____________________________________________  Time seen: Approximately 8:29 AM  I have reviewed the triage vital signs and the nursing notes.   HISTORY  Chief Complaint Shortness of Breath and Cough    HPI Donald Sellers is a 69 y.o. male with a history of pulmonary fibrosis on 4 L nasal cannula at all times who comes the ED complaining of worsening shortness of breath.  He noted yesterday that he was having some fatigue, coughing, chills, and then this morning he woke up feeling severely short of breath, worse with exertion.  Denies any chest pain but states that he has some slight aching which he attributes to coughing.  No vomiting diarrhea or fever.  No dizziness or syncope or palpitations.  He broke his foot a month ago and has a walking boot on the left leg.  He was hospitalized 2 months ago with pneumonia.      Past Medical History:  Diagnosis Date  . Patient denies medical problems   . Pulmonary fibrosis [facility])      Patient Active Problem List   Diagnosis Date Noted  . Gastroesophageal reflux disease 12/19/2019  . Acute respiratory failure with hypoxia (HCC) 12/11/2019     Past Surgical History:  Procedure Laterality Date  . EYE SURGERY    . ORIF ANKLE FRACTURE Left 12/28/2019   Procedure: OPEN REDUCTION INTERNAL FIXATION (ORIF) ANKLE FRACTURE;  Surgeon: Lyndle Herrlich, MD;  Location: [facility] ORS;  Service: Orthopedics;  Laterality: Left;     Prior to Admission medications   Medication Sig Start Date End Date Taking? Authorizing Provider  oxyCODONE-acetaminophen (PERCOCET) 5-325 MG tablet Take 1 tablet by mouth every 4 (four) hours as needed for severe pain. 12/28/19 12/27/20  Lyndle Herrlich, MD  pantoprazole (PROTONIX) 40 MG tablet Take 1 tablet (40 mg total) by mouth 2 (two) times daily. 12/17/19 03/16/20  Darlin Priestly, MD  sodium chloride (OCEAN) 0.65 % SOLN nasal spray Place 2 sprays into both  nostrils every 2 (two) hours while awake. Patient not taking: Reported on 12/19/2019 12/11/19 01/10/20  Barbaraann Barthel, NP     Allergies Patient has no known allergies.   Family History  Problem Relation Age of Onset  . Pulmonary fibrosis Neg Hx     Social History Social History   Tobacco Use  . Smoking status: Former Smoker    Packs/day: 1.00    Years: 11.00    Pack years: 11.00    Types: Cigarettes    Quit date: 1980    Years since quitting: 41.7  . Smokeless tobacco: Never Used  Substance Use Topics  . Alcohol use: Yes  . Drug use: Never    Review of Systems  Constitutional:   No fever positive chills.  ENT:   No sore throat. No rhinorrhea. Cardiovascular:   No chest pain or syncope. Respiratory:   Positive shortness of breath and nonproductive cough. Gastrointestinal:   Negative for abdominal pain, vomiting and diarrhea.  Musculoskeletal:   Negative for focal pain or swelling All other systems reviewed and are negative except as documented above in ROS and HPI.  ____________________________________________   PHYSICAL EXAM:  VITAL SIGNS: ED Triage Vitals  Enc Vitals Group     BP 02/02/2020 0742 105/70     Pulse Rate 02/12/2020 0742 (!) 142     Resp 01/28/2020 0742 (!) 34     Temp 02/12/2020 0742 98.6 F (37 C)     Temp Source  02/14/2020 0742 Oral     SpO2 01/23/2020 0742 (!) 64 %     Weight 02/07/2020 0743 171 lb 11.8 oz (77.9 kg)     Height 02/12/2020 0743 5\' 10"  (1.778 m)     Head Circumference --      Peak Flow --      Pain Score 02/04/2020 0742 0     Pain Loc --      Pain Edu? --      Excl. in GC? --     Vital signs reviewed, nursing assessments reviewed.   Constitutional:   Alert and oriented.  Mild respiratory distress Eyes:   Conjunctivae are normal. EOMI. PERRL. ENT      Head:   Normocephalic and atraumatic.      Nose:   Wearing a mask.      Mouth/Throat:   Wearing a mask.      Neck:   No meningismus. Full  ROM. Hematological/Lymphatic/Immunilogical:   No cervical lymphadenopathy. Cardiovascular:   Tachycardia heart rate 150. Symmetric bilateral radial and DP pulses.  No murmurs. Cap refill less than 2 seconds. Respiratory:   Tachypnea, bilateral basilar fine Velcro-like crackles.  Room air oxygen saturation 65%, increased to 95% on nonrebreather mask. Gastrointestinal:   Soft and nontender. Non distended. There is no CVA tenderness.  No rebound, rigidity, or guarding.  Musculoskeletal:   Normal range of motion in all extremities. No joint effusions.  No lower extremity tenderness.  No edema.  Symmetric calf circumference.  Walking boot on left lower leg.  There is clubbing of the fingernails Neurologic:   Normal speech and language.  Motor grossly intact. No acute focal neurologic deficits are appreciated.  Skin:    Skin is warm, dry and intact. No rash noted.  No petechiae, purpura, or bullae.  ____________________________________________    LABS (pertinent positives/negatives) (all labs ordered are listed, but only abnormal results are displayed) Labs Reviewed  COMPREHENSIVE METABOLIC PANEL - Abnormal; Notable for the following components:      Result Value   Sodium 133 (*)    Chloride 95 (*)    Glucose, Bld 132 (*)    Albumin 3.3 (*)    All other components within normal limits  CBC WITH DIFFERENTIAL/PLATELET - Abnormal; Notable for the following components:   WBC 18.0 (*)    Platelets 432 (*)    Neutro Abs 13.8 (*)    Monocytes Absolute 1.7 (*)    Basophils Absolute 0.3 (*)    Abs Immature Granulocytes 0.23 (*)    All other components within normal limits  SARS CORONAVIRUS 2 BY RT PCR (HOSPITAL ORDER, PERFORMED IN [facility])  CULTURE, BLOOD (ROUTINE X 2)  CULTURE, BLOOD (ROUTINE X 2)  URINE CULTURE  LACTIC ACID, PLASMA  PROTIME-INR  APTT  URINALYSIS, COMPLETE (UACMP) WITH MICROSCOPIC  TROPONIN I (HIGH SENSITIVITY)    ____________________________________________   EKG  Interpreted by me Sinus tachycardia rate 140.  Normal axis and intervals.  Normal QRS.  ST depressions in inferior and lateral leads, consistent with demand ischemia.  No ST elevation, unremarkable T waves.  ____________________________________________    RADIOLOGY  CT Angio Chest PE W and/or Wo Contrast  Result Date: 02/13/2020 CLINICAL DATA:  Shortness of breath. Evaluate for pulmonary embolism. EXAM: CT ANGIOGRAPHY CHEST WITH CONTRAST TECHNIQUE: Multidetector CT imaging of the chest was performed using the standard protocol during bolus administration of intravenous contrast. Multiplanar CT image reconstructions and MIPs were obtained to evaluate the vascular anatomy. CONTRAST:  79mL OMNIPAQUE IOHEXOL 350 MG/ML SOLN COMPARISON:  Chest CT-12/11/2019; chest radiograph-earlier same day; 12/16/2019 FINDINGS: Vascular Findings: There is adequate opacification of the pulmonary arterial system with the main pulmonary artery measuring 303 Hounsfield units. There are no discrete filling defects within the pulmonary arterial tree to suggest pulmonary embolism. Borderline enlarged caliber the main pulmonary artery measuring 3.3 cm in diameter (image 44, series 4). Cardiomegaly. No pericardial effusion. Coronary artery calcifications. No evidence of thoracic aortic aneurysm or dissection. Conventional configuration of the aortic arch. The branch vessels of the aortic arch appear patent throughout their imaged courses. There is a minimal amount of atherosclerotic plaque within the descending thoracic aorta, not resulting in a hemodynamically significant stenosis. Review of the MIP images confirms the above findings. ---------------------------------------------------------------------------------- Nonvascular Findings: Mediastinum/Lymph Nodes: Redemonstrated mediastinal and bilateral hilar lymphadenopathy with index high right posterior paratracheal lymph  node measuring 1.1 cm in greatest short axis diameter image 20, series 4), index precarinal lymph node measuring 1.0 cm (image 36), index left suprahilar lymph node measuring 1.2 cm (image 41), index left infrahilar lymph node measuring 1.4 cm (image 52), and index right infrahilar lymph node measuring 1.4 cm (image 58), grossly unchanged compared to the 12/11/2019 examination. No axillary lymphadenopathy. Lungs/Pleura: Improved aeration of the previously noted consolidative opacities within left upper lobe suggestive resolved infection and/or inflammation. Interval worsening extensive though basilar predominant interstitial and now consolidative airspace opacities with associated air bronchograms, progressed even compared to recent chest CT performed 11/2019. There is mild sparing of the left lung apex, with otherwise extensive involvement of the bilateral lungs with basilar predominance and questionable honeycombing within the subpleural aspect of the right lower lobe (representative image 64, series 6). No pleural effusion or pneumothorax. The central pulmonary airways appear patent. There is a punctate granuloma within the right lower lobe (image 69, series 6), unchanged. Evaluation for additional discrete pulmonary nodules is degraded secondary to extensive background parenchymal abnormalities. Upper abdomen: Limited early arterial phase evaluation of the upper abdomen demonstrates a splenule about the anterior tip of the spleen. Musculoskeletal: No acute or aggressive osseous abnormalities. Stigmata of dish within the thoracic spine. IMPRESSION: 1. No evidence of pulmonary embolism. 2. Progression of extensive bilateral interstitial and consolidative opacities, even compared to recent chest CT performed 12/11/2019, worrisome for progressive fibrotic lung disease, nonspecific though suggestive of UIP given probable honeycombing within the right lower lobe. 3. Improved aeration of previously noted consolidative  opacities within the left upper lobe suggestive of resolved infection and/or inflammation. 4. Redemonstrated mediastinal and bilateral hilar lymphadenopathy, nonspecific though presumably reactive in etiology. Electronically Signed   By: Simonne Come M.D.   On: 02/04/2020 10:37   DG Chest Portable 1 View  Result Date: 01/29/2020 CLINICAL DATA:  Shortness of breath EXAM: PORTABLE CHEST 1 VIEW COMPARISON:  December 16, 2019 FINDINGS: Widespread fibrosis remains. No new opacity is appreciable. Heart is upper normal in size with pulmonary vascularity normal. No adenopathy. No bone lesions IMPRESSION: Widespread fibrosis, essentially stable. It is difficult to exclude a degree of underlying emphysematous change. The appearance is essentially stable compared to prior study. No consolidation evident. Heart upper normal in size. Electronically Signed   By: Bretta Bang III M.D.   On: 01/28/2020 07:55    ____________________________________________   PROCEDURES .Critical Care Performed by: Sharman Cheek, MD Authorized by: Sharman Cheek, MD   Critical care provider statement:    Critical care time (minutes):  35   Critical care time was exclusive of:  Separately  billable procedures and treating other patients   Critical care was necessary to treat or prevent imminent or life-threatening deterioration of the following conditions:  Respiratory failure and sepsis   Critical care was time spent personally by me on the following activities:  Development of treatment plan with patient or surrogate, discussions with consultants, evaluation of patient's response to treatment, examination of patient, obtaining history from patient or surrogate, ordering and performing treatments and interventions, ordering and review of laboratory studies, ordering and review of radiographic studies, pulse oximetry, re-evaluation of patient's condition and review of old  charts    ____________________________________________  DIFFERENTIAL DIAGNOSIS   Pneumonia with sepsis, dehydration, COVID-19, pulmonary embolism, non-STEMI, pulmonary edema, pneumothorax  CLINICAL IMPRESSION / ASSESSMENT AND PLAN / ED COURSE  Medications ordered in the ED: Medications  sodium chloride 0.9 % bolus 1,000 mL (0 mLs Intravenous Stopped 02/17/2020 0930)  vancomycin (VANCOCIN) IVPB 1000 mg/200 mL premix (0 mg Intravenous Stopped 01/24/2020 1029)  ceFEPIme (MAXIPIME) 2 g in sodium chloride 0.9 % 100 mL IVPB (0 g Intravenous Stopped 02/10/2020 0857)  azithromycin (ZITHROMAX) 500 mg in sodium chloride 0.9 % 250 mL IVPB (0 mg Intravenous Stopped 01/28/2020 1030)  iohexol (OMNIPAQUE) 350 MG/ML injection 75 mL (75 mLs Intravenous Contrast Given 01/31/2020 0933)  methylPREDNISolone sodium succinate (SOLU-MEDROL) 125 mg/2 mL injection 125 mg (125 mg Intravenous Given 01/27/2020 1049)    Pertinent labs & imaging results that were available during my care of the patient were reviewed by me and considered in my medical decision making (see chart for details).  Donald Sellers was evaluated in Emergency Department on 02/05/2020 for the symptoms described in the history of present illness. He was evaluated in the context of the global COVID-19 pandemic, which necessitated consideration that the patient might be at risk for infection with the SARS-CoV-2 virus that causes COVID-19. Institutional protocols and algorithms that pertain to the evaluation of patients at risk for COVID-19 are in a state of rapid change based on information released by regulatory bodies including the CDC and federal and state organizations. These policies and algorithms were followed during the patient's care in the ED.   Patient presents with rapid onset of severe shortness of breath in the setting of baseline pulmonary fibrosis and chronic respiratory failure with hypoxia.  I will give IV fluids for hydration, continue nonrebreather  facemask to maintain normoxia.  Continue sepsis work-up, obtain CT angiogram chest to evaluate for PE.  Patient will need to be admitted.  ----------------------------------------- 11:06 AM on 01/20/2020 -----------------------------------------  Covid negative, lab work-up reassuring, troponin negative, lactate normal.  CT negative for PE but does show multifocal infiltrates due to either worsening pulmonary fibrosis disease or recurrent multifocal pneumonia.  Requested RT to transition patient from nonrebreather facemask to heated high flow nasal cannula.  Solu-Medrol ordered.      ____________________________________________   FINAL CLINICAL IMPRESSION(S) / ED DIAGNOSES    Final diagnoses:  Acute on chronic respiratory failure with hypoxia [facility](HCC)  Multifocal pneumonia     ED Discharge Orders    None      Portions of this note were generated with dragon dictation software. Dictation errors may occur despite best attempts at proofreading.   Sharman CheekStafford, Demitrus Francisco, MD 02/16/2020 [id number]

## 2020-02-08 NOTE — ED Notes (Signed)
Pt assisted to toilet then back to bed. Pt repositioned. No further complaints or requests from patient at this time.

## 2020-02-09 ENCOUNTER — Inpatient Hospital Stay: Payer: BC Managed Care – PPO

## 2020-02-09 ENCOUNTER — Encounter: Payer: Self-pay | Admitting: Internal Medicine

## 2020-02-09 LAB — CBC
HCT: 36.8 % — ABNORMAL LOW (ref 39.0–52.0)
Hemoglobin: 11.9 g/dL — ABNORMAL LOW (ref 13.0–17.0)
MCH: 29.3 pg (ref 26.0–34.0)
MCHC: 32.3 g/dL (ref 30.0–36.0)
MCV: 90.6 fL (ref 80.0–100.0)
Platelets: 378 10*3/uL (ref 150–400)
RBC: 4.06 MIL/uL — ABNORMAL LOW (ref 4.22–5.81)
RDW: 13.1 % (ref 11.5–15.5)
WBC: 18.9 10*3/uL — ABNORMAL HIGH (ref 4.0–10.5)
nRBC: 0 % (ref 0.0–0.2)

## 2020-02-09 LAB — RESPIRATORY PANEL BY PCR

## 2020-02-09 LAB — TROPONIN I (HIGH SENSITIVITY)
Troponin I (High Sensitivity): 5 ng/L (ref <18)
Troponin I (High Sensitivity): 8 ng/L (ref <18)

## 2020-02-09 LAB — MRSA PCR SCREENING: MRSA by PCR: NEGATIVE

## 2020-02-09 LAB — URINALYSIS, COMPLETE (UACMP) WITH MICROSCOPIC
Bacteria, UA: NONE SEEN
Bilirubin Urine: NEGATIVE
Glucose, UA: NEGATIVE mg/dL
Hgb urine dipstick: NEGATIVE
Ketones, ur: NEGATIVE mg/dL
Leukocytes,Ua: NEGATIVE
Nitrite: NEGATIVE
Protein, ur: NEGATIVE mg/dL
Specific Gravity, Urine: 1.017 (ref 1.005–1.030)
pH: 6 (ref 5.0–8.0)

## 2020-02-09 LAB — BASIC METABOLIC PANEL
Anion gap: 10 (ref 5–15)
BUN: 16 mg/dL (ref 8–23)
CO2: 28 mmol/L (ref 22–32)
Calcium: 9.1 mg/dL (ref 8.9–10.3)
Chloride: 102 mmol/L (ref 98–111)
Creatinine, Ser: 0.61 mg/dL (ref 0.61–1.24)
GFR calc Af Amer: >60 mL/min (ref >60)
GFR calc non Af Amer: >60 mL/min (ref >60)
Glucose, Bld: 160 mg/dL — ABNORMAL HIGH (ref 70–99)
Potassium: 4.5 mmol/L (ref 3.5–5.1)
Sodium: 140 mmol/L (ref 135–145)

## 2020-02-09 LAB — BRAIN NATRIURETIC PEPTIDE: B Natriuretic Peptide: 77.3 pg/mL (ref 0.0–100.0)

## 2020-02-09 LAB — EXPECTORATED SPUTUM ASSESSMENT W GRAM STAIN, RFLX TO RESP C

## 2020-02-09 LAB — HIV ANTIBODY (ROUTINE TESTING W REFLEX): HIV Screen 4th Generation wRfx: NONREACTIVE

## 2020-02-09 LAB — MAGNESIUM: Magnesium: 2.2 mg/dL (ref 1.7–2.4)

## 2020-02-09 LAB — TSH: TSH: 1.61 u[IU]/mL (ref 0.350–4.500)

## 2020-02-09 LAB — GLUCOSE, CAPILLARY: Glucose-Capillary: 148 mg/dL — ABNORMAL HIGH (ref 70–99)

## 2020-02-09 MED ORDER — PIPERACILLIN-TAZOBACTAM 3.375 G IVPB
3.3750 g | Freq: Three times a day (TID) | INTRAVENOUS | Status: DC
  Administered 2020-02-09 – 2020-02-15 (×18): 3.375 g via INTRAVENOUS
  Filled 2020-02-09 (×19): qty 50

## 2020-02-09 MED ORDER — DIGOXIN 250 MCG PO TABS
0.2500 mg | ORAL_TABLET | Freq: Two times a day (BID) | ORAL | Status: DC
  Administered 2020-02-09 – 2020-02-10 (×3): 0.25 mg via ORAL
  Filled 2020-02-09 (×4): qty 1

## 2020-02-09 MED ORDER — DILTIAZEM HCL-DEXTROSE 125-5 MG/125ML-% IV SOLN (PREMIX)
5.0000 mg/h | INTRAVENOUS | Status: DC
  Administered 2020-02-09 – 2020-02-10 (×2): 5 mg/h via INTRAVENOUS
  Administered 2020-02-10: 10 mg/h via INTRAVENOUS
  Administered 2020-02-11: 15 mg/h via INTRAVENOUS
  Administered 2020-02-11: 10 mg/h via INTRAVENOUS
  Filled 2020-02-09 (×5): qty 125

## 2020-02-09 MED ORDER — FUROSEMIDE 10 MG/ML IJ SOLN
20.0000 mg | Freq: Once | INTRAMUSCULAR | Status: DC
  Filled 2020-02-09: qty 2

## 2020-02-09 MED ORDER — SALINE SPRAY 0.65 % NA SOLN
1.0000 | NASAL | Status: DC | PRN
  Filled 2020-02-09: qty 44

## 2020-02-09 MED ORDER — FUROSEMIDE 10 MG/ML IJ SOLN
20.0000 mg | Freq: Once | INTRAMUSCULAR | Status: AC
  Administered 2020-02-09: 20 mg via INTRAVENOUS

## 2020-02-09 MED ORDER — LORAZEPAM 2 MG/ML IJ SOLN
1.0000 mg | Freq: Four times a day (QID) | INTRAMUSCULAR | Status: DC | PRN
  Administered 2020-02-10 – 2020-02-11 (×2): 1 mg via INTRAVENOUS
  Filled 2020-02-09 (×2): qty 1

## 2020-02-09 MED ORDER — DIGOXIN 0.25 MG/ML IJ SOLN
0.5000 mg | INTRAMUSCULAR | Status: AC
  Administered 2020-02-09: 0.5 mg via INTRAVENOUS
  Filled 2020-02-09: qty 2

## 2020-02-09 MED ORDER — PANTOPRAZOLE SODIUM 40 MG IV SOLR
40.0000 mg | INTRAVENOUS | Status: DC
  Administered 2020-02-09 – 2020-02-22 (×14): 40 mg via INTRAVENOUS
  Filled 2020-02-09 (×14): qty 40

## 2020-02-09 MED ORDER — MONTELUKAST SODIUM 10 MG PO TABS
10.0000 mg | ORAL_TABLET | Freq: Every day | ORAL | Status: DC
  Administered 2020-02-09 – 2020-02-13 (×4): 10 mg via ORAL
  Filled 2020-02-09 (×5): qty 1

## 2020-02-09 MED ORDER — DIGOXIN 250 MCG PO TABS: 0.2500 mg | ORAL_TABLET | Freq: Every day | ORAL | Status: DC

## 2020-02-09 NOTE — Consult Note (Signed)
Donald Sellers is a 69 y.o. male  [id number]  Primary Cardiologist: Adrian Blackwater Reason for Consultation: Atrial fibrillation with rapid ventricular response rate  HPI: This is a 69 year old white male with a past medical history of pulmonary fibrosis presented to the hospital with severe shortness of breath hypoxia and appears to be having mild shortness of breath right now.  Denies any chest pain.   Review of Systems: No chest pain   Past Medical History:  Diagnosis Date  . Patient denies medical problems   . Pulmonary fibrosis (HCC)     Medications Prior to Admission  Medication Sig Dispense Refill  . montelukast (SINGULAIR) 10 MG tablet Take 10 mg by mouth at bedtime.    . pantoprazole (PROTONIX) 40 MG tablet Take 1 tablet (40 mg total) by mouth 2 (two) times daily. 60 tablet 2     . ALPRAZolam  1 mg Oral QHS  . digoxin  0.25 mg Oral BID  . enoxaparin (LOVENOX) injection  40 mg Subcutaneous Q24H  . methylPREDNISolone (SOLU-MEDROL) injection  80 mg Intravenous TID  . montelukast  10 mg Oral QHS  . pantoprazole (PROTONIX) IV  40 mg Intravenous Q24H    Infusions: . diltiazem (CARDIZEM) infusion 5 mg/hr (02/09/20 0829)  . piperacillin-tazobactam (ZOSYN)  IV 3.375 g (02/09/20 1254)    No Known Allergies  Social History   Socioeconomic History  . Marital status: Married    Spouse name: Not on file  . Number of children: Not on file  . Years of education: Not on file  . Highest education level: Not on file  Occupational History  . Not on file  Tobacco Use  . Smoking status: Former Smoker    Packs/day: 1.00    Years: 11.00    Pack years: 11.00    Types: Cigarettes    Quit date: 1980    Years since quitting: 41.7  . Smokeless tobacco: Never Used  Vaping Use  . Vaping Use: Never used  Substance and Sexual Activity  . Alcohol use: Yes  . Drug use: Never  . Sexual activity: Not Currently    Birth control/protection: None  Other Topics Concern  . Not on  file  Social History Narrative  . Not on file   Social Determinants of Health   Financial Resource Strain:   . Difficulty of Paying Living Expenses: Not on file  Food Insecurity:   . Worried About Programme researcher, broadcasting/film/video in the Last Year: Not on file  . Ran Out of Food in the Last Year: Not on file  Transportation Needs:   . Lack of Transportation (Medical): Not on file  . Lack of Transportation (Non-Medical): Not on file  Physical Activity:   . Days of Exercise per Week: Not on file  . Minutes of Exercise per Session: Not on file  Stress:   . Feeling of Stress : Not on file  Social Connections:   . Frequency of Communication with Friends and Family: Not on file  . Frequency of Social Gatherings with Friends and Family: Not on file  . Attends Religious Services: Not on file  . Active Member of Clubs or Organizations: Not on file  . Attends Banker Meetings: Not on file  . Marital Status: Not on file  Intimate Partner Violence:   . Fear of Current or Ex-Partner: Not on file  . Emotionally Abused: Not on file  . Physically Abused: Not on file  . Sexually Abused: Not  on file    Family History  Problem Relation Age of Onset  . Pulmonary fibrosis Neg Hx     PHYSICAL EXAM: Vitals:   02/09/20 1245 02/09/20 1300  BP: 100/68 100/73  Pulse: (!) 124 (!) 138  Resp: (!) 36 (!) 25  Temp: (!) 97.5 F (36.4 C)   SpO2: 93% 94%     Intake/Output Summary (Last 24 hours) at 02/09/2020 1510 Last data filed at 02/09/2020 0950 Gross per 24 hour  Intake 812.09 ml  Output 600 ml  Net 212.09 ml    General:  Well appearing. No respiratory difficulty HEENT: normal Neck: supple. no JVD. Carotids 2+ bilat; no bruits. No lymphadenopathy or thryomegaly appreciated. Cor: PMI nondisplaced. Regular rate & rhythm. No rubs, gallops or murmurs. Lungs: clear Abdomen: soft, nontender, nondistended. No hepatosplenomegaly. No bruits or masses. Good bowel sounds. Extremities: no cyanosis,  clubbing, rash, edema Neuro: alert & oriented x 3, cranial nerves grossly intact. moves all 4 extremities w/o difficulty. Affect pleasant.  ECG: A. fib with rapid ventricular response rate approximately 150/min  Results for orders placed or performed during the hospital encounter of 02/02/2020 (from the past 24 hour(s))  Respiratory Panel by PCR     Status: None   Collection Time: 01/28/2020 11:39 PM   Specimen: Nasopharyngeal Swab; Respiratory  Result Value Ref Range   Adenovirus NOT DETECTED NOT DETECTED   Coronavirus 229E NOT DETECTED NOT DETECTED   Coronavirus HKU1 NOT DETECTED NOT DETECTED   Coronavirus NL63 NOT DETECTED NOT DETECTED   Coronavirus OC43 NOT DETECTED NOT DETECTED   Metapneumovirus NOT DETECTED NOT DETECTED   Rhinovirus / Enterovirus NOT DETECTED NOT DETECTED   Influenza A NOT DETECTED NOT DETECTED   Influenza B NOT DETECTED NOT DETECTED   Parainfluenza Virus 1 NOT DETECTED NOT DETECTED   Parainfluenza Virus 2 NOT DETECTED NOT DETECTED   Parainfluenza Virus 3 NOT DETECTED NOT DETECTED   Parainfluenza Virus 4 NOT DETECTED NOT DETECTED   Respiratory Syncytial Virus NOT DETECTED NOT DETECTED   Bordetella pertussis NOT DETECTED NOT DETECTED   Chlamydophila pneumoniae NOT DETECTED NOT DETECTED   Mycoplasma pneumoniae NOT DETECTED NOT DETECTED  MRSA PCR Screening     Status: None   Collection Time: 02/17/2020 11:39 PM   Specimen: Nasal Mucosa; Nasopharyngeal  Result Value Ref Range   MRSA by PCR NEGATIVE NEGATIVE  Glucose, capillary     Status: Abnormal   Collection Time: 02/09/20  1:21 AM  Result Value Ref Range   Glucose-Capillary 148 (H) 70 - 99 mg/dL  Urinalysis, Complete w Microscopic Urine, Clean Catch     Status: Abnormal   Collection Time: 02/09/20  2:08 AM  Result Value Ref Range   Color, Urine YELLOW (A) YELLOW   APPearance CLEAR (A) CLEAR   Specific Gravity, Urine 1.017 1.005 - 1.030   pH 6.0 5.0 - 8.0   Glucose, UA NEGATIVE NEGATIVE mg/dL   Hgb urine  dipstick NEGATIVE NEGATIVE   Bilirubin Urine NEGATIVE NEGATIVE   Ketones, ur NEGATIVE NEGATIVE mg/dL   Protein, ur NEGATIVE NEGATIVE mg/dL   Nitrite NEGATIVE NEGATIVE   Leukocytes,Ua NEGATIVE NEGATIVE   RBC / HPF 0-5 0 - 5 RBC/hpf   WBC, UA 0-5 0 - 5 WBC/hpf   Bacteria, UA NONE SEEN NONE SEEN   Squamous Epithelial / LPF 0-5 0 - 5   Mucus PRESENT   Sputum culture     Status: None   Collection Time: 02/09/20  7:14 AM   Specimen: Expectorated  Sputum  Result Value Ref Range   Specimen Description EXPECTORATED SPUTUM    Special Requests NONE    Sputum evaluation      THIS SPECIMEN IS ACCEPTABLE FOR SPUTUM CULTURE Performed at [facility], [address] [place], [place] [address]    Report Status 02/09/2020 FINAL   Basic metabolic panel     Status: Abnormal   Collection Time: 02/09/20  7:14 AM  Result Value Ref Range   Sodium 140 135 - 145 mmol/L   Potassium 4.5 3.5 - 5.1 mmol/L   Chloride 102 98 - 111 mmol/L   CO2 28 22 - 32 mmol/L   Glucose, Bld 160 (H) 70 - 99 mg/dL   BUN 16 8 - 23 mg/dL   Creatinine, Ser 0.09 0.61 - 1.24 mg/dL   Calcium 9.1 8.9 - 38.1 mg/dL   GFR calc non Af Amer >60 >60 mL/min   GFR calc Af Amer >60 >60 mL/min   Anion gap 10 5 - 15  CBC     Status: Abnormal   Collection Time: 02/09/20  7:14 AM  Result Value Ref Range   WBC 18.9 (H) 4.0 - 10.5 K/uL   RBC 4.06 (L) 4.22 - 5.81 MIL/uL   Hemoglobin 11.9 (L) 13.0 - 17.0 g/dL   HCT 82.9 (L) 39 - 52 %   MCV 90.6 80.0 - 100.0 fL   MCH 29.3 26.0 - 34.0 pg   MCHC 32.3 30.0 - 36.0 g/dL   RDW 93.7 16.9 - 67.8 %   Platelets 378 150 - 400 K/uL   nRBC 0.0 0.0 - 0.2 %  HIV Antibody (routine testing w rflx)     Status: None   Collection Time: 02/09/20  7:14 AM  Result Value Ref Range   HIV Screen 4th Generation wRfx Non Reactive Non Reactive  TSH     Status: None   Collection Time: 02/09/20  7:14 AM  Result Value Ref Range   TSH 1.610 0.350 - 4.500 uIU/mL  Troponin I (High Sensitivity)     Status:  None   Collection Time: 02/09/20  7:14 AM  Result Value Ref Range   Troponin I (High Sensitivity) 5 <18 ng/L  Blood gas, venous     Status: Abnormal (Preliminary result)   Collection Time: 02/09/20 10:37 AM  Result Value Ref Range   FIO2 1.00    Delivery systems HI FLOW NASAL CANNULA    pH, Ven 7.40 7.25 - 7.43   pCO2, Ven 51 44 - 60 mmHg   pO2, Ven PENDING 32 - 45 mmHg   Bicarbonate 31.6 (H) 20.0 - 28.0 mmol/L   Acid-Base Excess 5.6 (H) 0.0 - 2.0 mmol/L   O2 Saturation 34.7 %   Patient temperature 37.0    Collection site VEIN    Sample type VENOUS   Troponin I (High Sensitivity)     Status: None   Collection Time: 02/09/20 10:39 AM  Result Value Ref Range   Troponin I (High Sensitivity) 8 <18 ng/L  Brain natriuretic peptide     Status: None   Collection Time: 02/09/20 10:39 AM  Result Value Ref Range   B Natriuretic Peptide 77.3 0.0 - 100.0 pg/mL  Magnesium     Status: None   Collection Time: 02/09/20 10:39 AM  Result Value Ref Range   Magnesium 2.2 1.7 - 2.4 mg/dL   DG Chest 1 View  Result Date: 02/09/2020 CLINICAL DATA:  Shortness of breath.  Cough. EXAM: CHEST  1 VIEW COMPARISON:  Chest x-ray 01/24/2020, 12/11/2019 CT 02/09/2020. FINDINGS: Mediastinum and hilar structures appear stable. Heart size appears stable. Diffuse severe interstitial changes are again noted again. Chronic interstitial lung disease again could present this fashion. Superimposed acute process including pneumonitis cannot be excluded. No pleural effusion or pneumothorax. IMPRESSION: Diffuse severe interstitial changes are again noted. Chronic interstitial lung disease again could present this fashion. Superimposed acute process including pneumonitis cannot be excluded. No significant interim change from prior studies. Electronically Signed   By: Maisie Fus  Register   On: 02/09/2020 08:25   CT Angio Chest PE W and/or Wo Contrast  Result Date: 01/22/2020 CLINICAL DATA:  Shortness of breath. Evaluate for  pulmonary embolism. EXAM: CT ANGIOGRAPHY CHEST WITH CONTRAST TECHNIQUE: Multidetector CT imaging of the chest was performed using the standard protocol during bolus administration of intravenous contrast. Multiplanar CT image reconstructions and MIPs were obtained to evaluate the vascular anatomy. CONTRAST:  8mL OMNIPAQUE IOHEXOL 350 MG/ML SOLN COMPARISON:  Chest CT-12/11/2019; chest radiograph-earlier same day; 12/16/2019 FINDINGS: Vascular Findings: There is adequate opacification of the pulmonary arterial system with the main pulmonary artery measuring 303 Hounsfield units. There are no discrete filling defects within the pulmonary arterial tree to suggest pulmonary embolism. Borderline enlarged caliber the main pulmonary artery measuring 3.3 cm in diameter (image 44, series 4). Cardiomegaly. No pericardial effusion. Coronary artery calcifications. No evidence of thoracic aortic aneurysm or dissection. Conventional configuration of the aortic arch. The branch vessels of the aortic arch appear patent throughout their imaged courses. There is a minimal amount of atherosclerotic plaque within the descending thoracic aorta, not resulting in a hemodynamically significant stenosis. Review of the MIP images confirms the above findings. ---------------------------------------------------------------------------------- Nonvascular Findings: Mediastinum/Lymph Nodes: Redemonstrated mediastinal and bilateral hilar lymphadenopathy with index high right posterior paratracheal lymph node measuring 1.1 cm in greatest short axis diameter image 20, series 4), index precarinal lymph node measuring 1.0 cm (image 36), index left suprahilar lymph node measuring 1.2 cm (image 41), index left infrahilar lymph node measuring 1.4 cm (image 52), and index right infrahilar lymph node measuring 1.4 cm (image 58), grossly unchanged compared to the 12/11/2019 examination. No axillary lymphadenopathy. Lungs/Pleura: Improved aeration of the  previously noted consolidative opacities within left upper lobe suggestive resolved infection and/or inflammation. Interval worsening extensive though basilar predominant interstitial and now consolidative airspace opacities with associated air bronchograms, progressed even compared to recent chest CT performed 11/2019. There is mild sparing of the left lung apex, with otherwise extensive involvement of the bilateral lungs with basilar predominance and questionable honeycombing within the subpleural aspect of the right lower lobe (representative image 64, series 6). No pleural effusion or pneumothorax. The central pulmonary airways appear patent. There is a punctate granuloma within the right lower lobe (image 69, series 6), unchanged. Evaluation for additional discrete pulmonary nodules is degraded secondary to extensive background parenchymal abnormalities. Upper abdomen: Limited early arterial phase evaluation of the upper abdomen demonstrates a splenule about the anterior tip of the spleen. Musculoskeletal: No acute or aggressive osseous abnormalities. Stigmata of dish within the thoracic spine. IMPRESSION: 1. No evidence of pulmonary embolism. 2. Progression of extensive bilateral interstitial and consolidative opacities, even compared to recent chest CT performed 12/11/2019, worrisome for progressive fibrotic lung disease, nonspecific though suggestive of UIP given probable honeycombing within the right lower lobe. 3. Improved aeration of previously noted consolidative opacities within the left upper lobe suggestive of resolved infection and/or inflammation. 4. Redemonstrated mediastinal and bilateral hilar lymphadenopathy, nonspecific though presumably reactive in etiology. Electronically  Signed   By: Simonne ComeJohn  Watts M.D.   On: 2019/12/02 10:37   DG Chest Portable 1 View  Result Date: 13-Mar-2020 CLINICAL DATA:  Shortness of breath EXAM: PORTABLE CHEST 1 VIEW COMPARISON:  December 16, 2019 FINDINGS: Widespread  fibrosis remains. No new opacity is appreciable. Heart is upper normal in size with pulmonary vascularity normal. No adenopathy. No bone lesions IMPRESSION: Widespread fibrosis, essentially stable. It is difficult to exclude a degree of underlying emphysematous change. The appearance is essentially stable compared to prior study. No consolidation evident. Heart upper normal in size. Electronically Signed   By: Bretta BangWilliam  Woodruff III M.D.   On: 2019/12/02 07:55     ASSESSMENT AND PLAN: A. fib with rapid ventricular response rate with underlying pulmonary fibrosis and hypoxia and respiratory failure.  Patient is already on Cardizem drip and digoxin 0.25 once a day.  Advise digoxin 0.25 twice a day with extra dose right now.  Tomorrow once heart rate comes down over the next day can change to 0.25 once a day.  This patient is not a candidate for IV amiodarone because of pulmonary fibrosis and is also not a candidate for Tikosyn since QTC is over 490 on EKG.  Is also not a candidate for sotalol because of elevated QTC.  Unfortunately we will have to manage him with digoxin and Cardizem with blood pressure already being low is better to go up on digoxin.  May get dig level after a day or 2 to monitor how much digoxin to give him.  Echocardiogram showed normal ejection fraction so Cardizem is also okay as long as blood pressure can tolerate.  Abagael Kramm A

## 2020-02-09 NOTE — Progress Notes (Signed)
PROGRESS NOTE    Donald Sellers  OVF:[id number] DOB: 03-19-1951 DOA: 2020/02/09 PCP: Patient, No Pcp Per  Outpatient Specialists: Tamsen Snider pulmonology    Brief Narrative:  Donald Sellers is a 69 y.o. male with medical history significant for pulmonary fibrosis on 4 L of oxygen continuous who presents to the emergency room for evaluation of worsening shortness of breath from his baseline.  He has a cough productive of greenish phlegm but denies having any fever or chills.  He has no sick contacts.  Patient states that over the last 24 hours his pulse oximetry on 4 L has been dropping to the high 70s and low 80s with any form of exertion.  He is currently on a nonrebreather mask with pulse oximetry of about 92%. He denies having any chest pain, no nausea, no vomiting, no diaphoresis or palpitations, no urinary symptoms or changes in his bowel habits. Patient was recently diagnosed with pulmonary fibrosis in July and states that he has had worsening dyspnea on exertion over the last 1 year. Labs show sodium 133, potassium 3.8, chloride 95, bicarb 26, BUN 8, creatinine 0.63, calcium 8.9, AST 31, ALT 28, total protein 7.9, lactic acid 1.5, white count 18, hemoglobin 13.4, hematocrit 39.8, MCV 87.1, RDW 13.4, platelet count 432 Patient's COVID-19 PCR test is negative CT angiogram of the chest shows no evidence of  Pulmonary embolism.Progression of extensive bilateral interstitial and consolidative opacities, even compared to recent chest CT performed 12/11/2019, worrisome for progressive fibrotic lung disease, nonspecific though suggestive of UIP given probable honeycombing within the right lower lobe. Improved aeration of previously noted consolidative opacities within the left upper lobe suggestive of resolved infection and/or Inflammation. Redemonstrated mediastinal and bilateral hilar lymphadenopathy, nonspecific though presumably reactive in etiology. Chest x-ray reviewed by me shows  widespread fibrosis Twelve-lead EKG reviewed by me shows sinus tachycardia   ED Course: Patient is a 69 year old Caucasian male with a history of pulmonary fibrosis and chronic respiratory failure on 4 L of oxygen who presents to the ER for evaluation of worsening shortness of breath from his baseline and drop in his pulse oximetry on 4 L of oxygen with any form of exertion.  He has a cough productive of greenish phlegm.  On 4 L patient had pulse oximetry in the low 80s and is currently on a nonrebreather mask.  He has marked leukocytosis with a left shift and imaging is suggestive of possible pneumonia.  He received a dose of vancomycin and cefepime as well as IV Solu-Medrol in the ER.  He will be admitted to the hospital for further evaluation    Assessment & Plan:   Principal Problem:   Sepsis (HCC) Active Problems:   Gastroesophageal reflux disease   CAP (community acquired pneumonia)   Pulmonary fibrosis (HCC)   Acute on chronic respiratory failure with hypoxia (HCC)  # New-onset a-fib with rvr This morning in setting of hypoxia to 80s hr in the 160s, ekg showing a fib with borderling TWIs. Patient actually resting comfortably in bed, not complaining of respiratory distress or chest pain. Vitals stable. Has received 1.8 L since admission. Think this likely 2/2 active pulmonary process, IV fluids possibly contributing (does have known diastolic dysfunction). No PE on CTA yesterday. - hold IV fluids - start dilt drip low dose - cardiology consulted - f/u tsh - lasix 20 mg IV once - f/u CXR - troponin x2 - hold duonebs  # Community acquired pneumonia # Sepsis (POA) As evidenced by  tachycardia, tachypnea, leukocytosis with a left shift and imaging suggestive of worsening consolidative opacities Patient has a history of pulmonary fibrosis and presents with worsening shortness of breath, cough productive of greenish phlegm and worsening hypoxia - will broaden to zosyn (mrsa screen  was neg) - cont Solu-Medrol 40 mg IV every 12 - respiratory as below - f/u cultures/antigens - appreciating pulmonology's guidance  # Acute on chronic respiratory failure with hypoxia Patient has a history of chronic respiratory failure and is on 4 L of oxygen continuous. This morning on 50 L high flow [place], breathing comfortably but with sats in the 80s, in the setting of new a fib with rvr - starting bipap - broaden to zosyn - lasix x1 as above - f/u cxr and vbg  # GERD Continue PPI   DVT prophylaxis: lovenox Code Status: full Family Communication: will attempt later today Disposition Plan: tbd  Status is: inpatient  Remains inpatient appropriate because: acutely ill requiring IV medication and inpatient evaluation   Dispo: The patient is from: home              Anticipated d/c is to: home              Anticipated d/c date is: 9/28              Consultants:  Pulmonology, cardiology  Procedures: none  Antimicrobials:  vanc 9/20, cefepime 9/20-21, zosyn 9/20    Subjective: Breathing stable, no chest pain or palpitations, no nausea/vomiting, has appetite  Objective: Vitals:   02/09/20 0334 02/09/20 0400 02/09/20 0500 02/09/20 0759  BP:    103/76  Pulse:    (!) 147  Resp:  (!) 28 (!) 25 20  Temp:    97.7 F (36.5 C)  TempSrc:    Oral  SpO2: 92%   92%  Weight:      Height:        Intake/Output Summary (Last 24 hours) at 02/09/2020 0813 Last data filed at 02/09/2020 0207 Gross per 24 hour  Intake 1812.09 ml  Output 600 ml  Net 1212.09 ml   Filed Weights   Mar 15, 2020 0743 02/09/20 0000  Weight: 77.9 kg 86.2 kg    Examination:  General exam: Appears calm and comfortable  Respiratory system: mild tachypnea, rales at bases, scratchy sounds throughout, rhonchi  Cardiovascular system: irreg irreg, rapid,  Gastrointestinal system: Abdomen is nondistended, soft and nontender. No organomegaly or masses felt. Normal bowel sounds heard. Central nervous  system: Alert and oriented. No focal neurological deficits. Extremities: Symmetric 5 x 5 power. Skin: No rashes, lesions or ulcers Psychiatry: Judgement and insight appear normal. Mood & affect appropriate.     Data Reviewed: I have personally reviewed following labs and imaging studies  CBC: Recent Labs  Lab Mar 15, 2020 0754 02/09/20 0714  WBC 18.0* 18.9*  NEUTROABS 13.8*  --   HGB 13.4 11.9*  HCT 39.8 36.8*  MCV 87.1 90.6  PLT 432* 378   Basic Metabolic Panel: Recent Labs  Lab Mar 15, 2020 0754  NA 133*  K 3.8  CL 95*  CO2 26  GLUCOSE 132*  BUN 8  CREATININE 0.63  CALCIUM 8.9   GFR: Estimated Creatinine Clearance: 92.5 mL/min (by C-G formula based on SCr of 0.63 mg/dL). Liver Function Tests: Recent Labs  Lab Mar 15, 2020 0754  AST 31  ALT 28  ALKPHOS 112  BILITOT 1.1  PROT 7.9  ALBUMIN 3.3*   No results for input(s): LIPASE, AMYLASE in the last 168 hours. No results  for input(s): AMMONIA in the last 168 hours. Coagulation Profile: Recent Labs  Lab 20-Feb-2020 0754  INR 1.2   Cardiac Enzymes: No results for input(s): CKTOTAL, CKMB, CKMBINDEX, TROPONINI in the last 168 hours. BNP (last 3 results) No results for input(s): PROBNP in the last 8760 hours. HbA1C: No results for input(s): HGBA1C in the last 72 hours. CBG: Recent Labs  Lab 02/09/20 0121  GLUCAP 148*   Lipid Profile: No results for input(s): CHOL, HDL, LDLCALC, TRIG, CHOLHDL, LDLDIRECT in the last 72 hours. Thyroid Function Tests: No results for input(s): TSH, T4TOTAL, FREET4, T3FREE, THYROIDAB in the last 72 hours. Anemia Panel: No results for input(s): VITAMINB12, FOLATE, FERRITIN, TIBC, IRON, RETICCTPCT in the last 72 hours. Urine analysis:    Component Value Date/Time   COLORURINE YELLOW (A) 02/09/2020 0208   APPEARANCEUR CLEAR (A) 02/09/2020 0208   LABSPEC 1.017 02/09/2020 0208   PHURINE 6.0 02/09/2020 0208   GLUCOSEU NEGATIVE 02/09/2020 0208   HGBUR NEGATIVE 02/09/2020 0208    BILIRUBINUR NEGATIVE 02/09/2020 0208   KETONESUR NEGATIVE 02/09/2020 0208   PROTEINUR NEGATIVE 02/09/2020 0208   NITRITE NEGATIVE 02/09/2020 0208   LEUKOCYTESUR NEGATIVE 02/09/2020 0208   Sepsis Labs: @LABRCNTIP (procalcitonin:4,lacticidven:4)  ) Recent Results (from the past 240 hour(s))  Blood Culture (routine x 2)     Status: None (Preliminary result)   Collection Time: 02/20/20  7:54 AM   Specimen: BLOOD  Result Value Ref Range Status   Specimen Description BLOOD BLOOD LEFT ARM  Final   Special Requests   Final    BOTTLES DRAWN AEROBIC AND ANAEROBIC Blood Culture adequate volume   Culture   Final    NO GROWTH < 24 HOURS Performed at [facility], [address], [place], [place]    Report Status PENDING  Incomplete  Blood Culture (routine x 2)     Status: None (Preliminary result)   Collection Time: 02-20-20  8:01 AM   Specimen: BLOOD  Result Value Ref Range Status   Specimen Description BLOOD BLOOD LEFT HAND  Final   Special Requests   Final    BOTTLES DRAWN AEROBIC AND ANAEROBIC Blood Culture results may not be optimal due to an excessive volume of blood received in culture bottles   Culture   Final    NO GROWTH < 24 HOURS Performed at [facility], [address], [place], [place]    Report Status PENDING  Incomplete  SARS Coronavirus 2 by RT PCR (hospital order, performed in [facility] hospital lab) Nasopharyngeal Nasopharyngeal Swab     Status: None   Collection Time: 02-20-20  8:01 AM   Specimen: Nasopharyngeal Swab  Result Value Ref Range Status   SARS Coronavirus 2 NEGATIVE NEGATIVE Final    Comment: (NOTE) SARS-CoV-2 target nucleic acids are NOT DETECTED.  The SARS-CoV-2 RNA is generally detectable in upper and lower respiratory specimens during the acute phase of infection. The lowest concentration of SARS-CoV-2 viral copies this assay can detect is 250 copies / mL. A negative result does not preclude SARS-CoV-2  infection and should not be used as the sole basis for treatment or other patient management decisions.  A negative result may occur with improper specimen collection / handling, submission of specimen other than nasopharyngeal swab, presence of viral mutation(s) within the areas targeted by this assay, and inadequate number of viral copies (<250 copies / mL). A negative result must be combined with clinical observations, patient history, and epidemiological information.  Fact Sheet for  Patients:   BoilerBrush.com.cy  Fact Sheet for Healthcare Providers: https://pope.com/  This test is not yet approved or  cleared by the [place] FDA and has been authorized for detection and/or diagnosis of SARS-CoV-2 by FDA under an Emergency Use Authorization (EUA).  This EUA will remain in effect (meaning this test can be used) for the duration of the COVID-19 declaration under Section 564(b)(1) of the Act, 21 U.S.C. section 360bbb-3(b)(1), unless the authorization is terminated or revoked sooner.  Performed at [facility], [address] [place], [place] [address]   Respiratory Panel by PCR     Status: None   Collection Time: 02/07/2020 11:39 PM   Specimen: Nasopharyngeal Swab; Respiratory  Result Value Ref Range Status   Adenovirus NOT DETECTED NOT DETECTED Final   Coronavirus 229E NOT DETECTED NOT DETECTED Final    Comment: (NOTE) The Coronavirus on the Respiratory Panel, DOES NOT test for the novel  Coronavirus (2019 nCoV)    Coronavirus HKU1 NOT DETECTED NOT DETECTED Final   Coronavirus NL63 NOT DETECTED NOT DETECTED Final   Coronavirus OC43 NOT DETECTED NOT DETECTED Final   Metapneumovirus NOT DETECTED NOT DETECTED Final   Rhinovirus / Enterovirus NOT DETECTED NOT DETECTED Final   Influenza A NOT DETECTED NOT DETECTED Final   Influenza B NOT DETECTED NOT DETECTED Final   Parainfluenza Virus 1 NOT DETECTED NOT DETECTED  Final   Parainfluenza Virus 2 NOT DETECTED NOT DETECTED Final   Parainfluenza Virus 3 NOT DETECTED NOT DETECTED Final   Parainfluenza Virus 4 NOT DETECTED NOT DETECTED Final   Respiratory Syncytial Virus NOT DETECTED NOT DETECTED Final   Bordetella pertussis NOT DETECTED NOT DETECTED Final   Chlamydophila pneumoniae NOT DETECTED NOT DETECTED Final   Mycoplasma pneumoniae NOT DETECTED NOT DETECTED Final    Comment: Performed at [facility], [address] [place], [place] [address]  MRSA PCR Screening     Status: None   Collection Time: 01/23/2020 11:39 PM   Specimen: Nasal Mucosa; Nasopharyngeal  Result Value Ref Range Status   MRSA by PCR NEGATIVE NEGATIVE Final    Comment:        The GeneXpert MRSA Assay (FDA approved for NASAL specimens only), is one component of a comprehensive MRSA colonization surveillance program. It is not intended to diagnose MRSA infection nor to guide or monitor treatment for MRSA infections. Performed at [facility], [address] [place], [place] [address]          Radiology Studies: CT Angio Chest PE W and/or Wo Contrast  Result Date: 01/22/2020 CLINICAL DATA:  Shortness of breath. Evaluate for pulmonary embolism. EXAM: CT ANGIOGRAPHY CHEST WITH CONTRAST TECHNIQUE: Multidetector CT imaging of the chest was performed using the standard protocol during bolus administration of intravenous contrast. Multiplanar CT image reconstructions and MIPs were obtained to evaluate the vascular anatomy. CONTRAST:  75mL OMNIPAQUE IOHEXOL 350 MG/ML SOLN COMPARISON:  Chest CT-12/11/2019; chest radiograph-earlier same day; 12/16/2019 FINDINGS: Vascular Findings: There is adequate opacification of the pulmonary arterial system with the main pulmonary artery measuring 303 Hounsfield units. There are no discrete filling defects within the pulmonary arterial tree to suggest pulmonary embolism. Borderline enlarged caliber the main pulmonary artery measuring  3.3 cm in diameter (image 44, series 4). Cardiomegaly. No pericardial effusion. Coronary artery calcifications. No evidence of thoracic aortic aneurysm or dissection. Conventional configuration of the aortic arch. The branch vessels of the aortic arch appear patent throughout their imaged courses. There is a minimal amount  of atherosclerotic plaque within the descending thoracic aorta, not resulting in a hemodynamically significant stenosis. Review of the MIP images confirms the above findings. ---------------------------------------------------------------------------------- Nonvascular Findings: Mediastinum/Lymph Nodes: Redemonstrated mediastinal and bilateral hilar lymphadenopathy with index high right posterior paratracheal lymph node measuring 1.1 cm in greatest short axis diameter image 20, series 4), index precarinal lymph node measuring 1.0 cm (image 36), index left suprahilar lymph node measuring 1.2 cm (image 41), index left infrahilar lymph node measuring 1.4 cm (image 52), and index right infrahilar lymph node measuring 1.4 cm (image 58), grossly unchanged compared to the 12/11/2019 examination. No axillary lymphadenopathy. Lungs/Pleura: Improved aeration of the previously noted consolidative opacities within left upper lobe suggestive resolved infection and/or inflammation. Interval worsening extensive though basilar predominant interstitial and now consolidative airspace opacities with associated air bronchograms, progressed even compared to recent chest CT performed 11/2019. There is mild sparing of the left lung apex, with otherwise extensive involvement of the bilateral lungs with basilar predominance and questionable honeycombing within the subpleural aspect of the right lower lobe (representative image 64, series 6). No pleural effusion or pneumothorax. The central pulmonary airways appear patent. There is a punctate granuloma within the right lower lobe (image 69, series 6), unchanged. Evaluation  for additional discrete pulmonary nodules is degraded secondary to extensive background parenchymal abnormalities. Upper abdomen: Limited early arterial phase evaluation of the upper abdomen demonstrates a splenule about the anterior tip of the spleen. Musculoskeletal: No acute or aggressive osseous abnormalities. Stigmata of dish within the thoracic spine. IMPRESSION: 1. No evidence of pulmonary embolism. 2. Progression of extensive bilateral interstitial and consolidative opacities, even compared to recent chest CT performed 12/11/2019, worrisome for progressive fibrotic lung disease, nonspecific though suggestive of UIP given probable honeycombing within the right lower lobe. 3. Improved aeration of previously noted consolidative opacities within the left upper lobe suggestive of resolved infection and/or inflammation. 4. Redemonstrated mediastinal and bilateral hilar lymphadenopathy, nonspecific though presumably reactive in etiology. Electronically Signed   By: Simonne Come M.D.   On: 02-10-2020 10:37   DG Chest Portable 1 View  Result Date: 02-10-2020 CLINICAL DATA:  Shortness of breath EXAM: PORTABLE CHEST 1 VIEW COMPARISON:  December 16, 2019 FINDINGS: Widespread fibrosis remains. No new opacity is appreciable. Heart is upper normal in size with pulmonary vascularity normal. No adenopathy. No bone lesions IMPRESSION: Widespread fibrosis, essentially stable. It is difficult to exclude a degree of underlying emphysematous change. The appearance is essentially stable compared to prior study. No consolidation evident. Heart upper normal in size. Electronically Signed   By: Bretta Bang III M.D.   On: February 10, 2020 07:55        Scheduled Meds: . ALPRAZolam  1 mg Oral QHS  . enoxaparin (LOVENOX) injection  40 mg Subcutaneous Q24H  . furosemide  20 mg Intravenous Once  . methylPREDNISolone (SOLU-MEDROL) injection  80 mg Intravenous TID   Continuous Infusions: . diltiazem (CARDIZEM) infusion    .  levofloxacin (LEVAQUIN) IV Stopped (02-10-20 1604)     LOS: 1 day    Time spent: 40 min    Silvano Bilis, MD [facility]   If 7PM-7AM, please contact night-coverage [email or web address] Password [facility] 02/09/2020, 8:13 AM

## 2020-02-09 NOTE — Consult Note (Signed)
Pharmacy Antibiotic Note  Donald Sellers is a 69 y.o. male with medical history significant for pulmonary fibrosis admitted on 25-Feb-2020 with pneumonia. Labs significant for leukocytosis with mild left shift. Patient is afebrile. Imaging worrisome for progressive fibrotic lung disease. Pharmacy has been consulted for Zosyn dosing.  Plan: Zosyn 3.375g IV q8h (4 hour infusion).  Height: 5\' 10"  (177.8 cm) Weight: 86.2 kg (190 lb 0.6 oz) IBW/kg (Calculated) : 73  Temp (24hrs), Avg:98.2 F (36.8 C), Min:97.7 F (36.5 C), Max:98.7 F (37.1 C)  Recent Labs  Lab 2020/02/25 0754 02/09/20 0714  WBC 18.0* 18.9*  CREATININE 0.63  --   LATICACIDVEN 1.5  --     Estimated Creatinine Clearance: 92.5 mL/min (by C-G formula based on SCr of 0.63 mg/dL).    No Known Allergies  Antimicrobials this admission: Vancomycin/cefepime/azithromycin/Levaquin 9/20 x 1  Zosyn 9/21 >>   Dose adjustments this admission: n/a  Microbiology results: 9/21 UCx: pending  9/20 BCx: NGTD 9/20 Respiratory panel: Negative 9/20 Sputum: pending  9/20 MRSA PCR: Negative  Thank you for allowing pharmacy to be a part of this patient's care.  10/20 02/09/2020 8:19 AM

## 2020-02-09 NOTE — Plan of Care (Signed)
Discussed with patient plan of care for the evening, pain management and importance of IVF with diagnosis of Sepsis with some teach back displayed.

## 2020-02-09 NOTE — Progress Notes (Signed)
Patient was in room sweating trying to pull off gown.  Patient was alert and oriented to person, place and time.  He was assisted with drying off and changing gown.  Checked temperature and blood sugar.

## 2020-02-09 NOTE — Progress Notes (Signed)
Pulmonary Medicine          Date: 02/09/2020,   MRN# [id number] Donald Sellers Dec 13, 1950     AdmissionWeight: 77.9 kg                 CurrentWeight: 86.2 kg  Referring physician: Dr Joylene Igo   CHIEF COMPLAINT:   Acute hypoxemic respiratory failure   HISTORY OF PRESENT ILLNESS and SUBJECTIVE FINDINGS   47M with no PMH came in with chief complaint of SOB/DOE 2d. He had noted worsening cough and phlegm on expectoration when at home. He reports his Oxygen therapy had stopped working and at 2 am he was on phone with staff attempting to fix his poc. He was able to use oxygen tank. He was asmoker in past. He was found to be acutely hypoxemic but shares that DOE has been progressively getting worse over past 1 year.Initial sat 79% on RA, was placed on 6L [place].  Afebrile, pulse 126, BP 151/92.  Labs notable for WBC 18, Hgb wnl, BNP 151, trop 20 and 20, lactic acid wnl, procal <0.1, COVID neg, CXR showed Extensive bilateral interstitial opacities throughout both lungs. CTA neg for PE, but showed Right-sided volume loss with subpleural architectural distortion/honeycombing. Extensive superimposed ground-glass pulmonary infiltrate and Superimposed consolidation within the left upper lobe.  Pt received ceftriaxone, azithromycin and IV deca 10 mg in the ED before admission.  Pulmonary consultation for further management of fibrotic lung disease with acute hypoxemia. CTPE on this admission is negative with    02/09/20- Patient in [facility] during evaluation this am.  He was placed on BIPAP and is on cardizem gtt.  He may need closer monitoring since he is now on drip and NIV.  Will attempt to wean off both today and if unable to will advance care to SDU.    PAST MEDICAL HISTORY   Past Medical History:  Diagnosis Date  . Patient denies medical problems   . Pulmonary fibrosis (HCC)      SURGICAL HISTORY   Past Surgical History:  Procedure Laterality Date  . EYE SURGERY    . ORIF ANKLE  FRACTURE Left 12/28/2019   Procedure: OPEN REDUCTION INTERNAL FIXATION (ORIF) ANKLE FRACTURE;  Surgeon: Lyndle Herrlich, MD;  Location: [facility] ORS;  Service: Orthopedics;  Laterality: Left;     FAMILY HISTORY   Family History  Problem Relation Age of Onset  . Pulmonary fibrosis Neg Hx      SOCIAL HISTORY   Social History   Tobacco Use  . Smoking status: Former Smoker    Packs/day: 1.00    Years: 11.00    Pack years: 11.00    Types: Cigarettes    Quit date: 1980    Years since quitting: 41.7  . Smokeless tobacco: Never Used  Vaping Use  . Vaping Use: Never used  Substance Use Topics  . Alcohol use: Yes  . Drug use: Never     MEDICATIONS    Home Medication:    Current Medication:  Current Facility-Administered Medications:  .  ALPRAZolam Prudy Feeler) tablet 1 mg, 1 mg, Oral, QHS, Horacio Werth, MD, 1 mg at 2020-03-02 2323 .  diltiazem (CARDIZEM) 125 mg in dextrose 5% 125 mL (1 mg/mL) infusion, 5-15 mg/hr, Intravenous, Titrated, Wouk, Wilfred Curtis, MD, Last Rate: 5 mL/hr at 02/09/20 0829, 5 mg/hr at 02/09/20 0829 .  enoxaparin (LOVENOX) injection 40 mg, 40 mg, Subcutaneous, Q24H, Agbata, Tochukwu, MD, 40 mg at March 02, 2020 2323 .  methylPREDNISolone sodium succinate (SOLU-MEDROL)  125 mg/2 mL injection 80 mg, 80 mg, Intravenous, TID, Vida Rigger, MD, 80 mg at 02/09/20 0824 .  piperacillin-tazobactam (ZOSYN) IVPB 3.375 g, 3.375 g, Intravenous, Q8H, Tressie Ellis, RPH    ALLERGIES   Patient has no known allergies.     REVIEW OF SYSTEMS    Review of Systems:  Gen:  Denies  fever, sweats, chills weigh loss  HEENT: Denies blurred vision, double vision, ear pain, eye pain, hearing loss, nose bleeds, sore throat Cardiac:  No dizziness, chest pain or heaviness, chest tightness,edema Resp:   Denies cough or sputum porduction, shortness of breath,wheezing, hemoptysis,  Gi: Denies swallowing difficulty, stomach pain, nausea or vomiting, diarrhea, constipation, bowel  incontinence Gu:  Denies bladder incontinence, burning urine Ext:   Denies Joint pain, stiffness or swelling Skin: Denies  skin rash, easy bruising or bleeding or hives Endoc:  Denies polyuria, polydipsia , polyphagia or weight change Psych:   Denies depression, insomnia or hallucinations   Other:  All other systems negative   VS: BP 103/76 (BP Location: Left Arm)   Pulse (!) 147   Temp 97.7 F (36.5 C) (Oral)   Resp 20   Ht 5\' 10"  (1.778 m)   Wt 86.2 kg   SpO2 92%   BMI 27.27 kg/m      PHYSICAL EXAM    GENERAL:NAD, no fevers, chills, no weakness no fatigue HEAD: Normocephalic, atraumatic.  EYES: Pupils equal, round, reactive to light. Extraocular muscles intact. No scleral icterus.  MOUTH: Moist mucosal membrane. Dentition intact. No abscess noted.  EAR, NOSE, THROAT: Clear without exudates. No external lesions.  NECK: Supple. No thyromegaly. No nodules. No JVD.  PULMONARY: Diffuse coarse rhonchi right sided +wheezes CARDIOVASCULAR: S1 and S2. Regular rate and rhythm. No murmurs, rubs, or gallops. No edema. Pedal pulses 2+ bilaterally.  GASTROINTESTINAL: Soft, nontender, nondistended. No masses. Positive bowel sounds. No hepatosplenomegaly.  MUSCULOSKELETAL: No swelling, clubbing, or edema. Range of motion full in all extremities.  NEUROLOGIC: Cranial nerves II through XII are intact. No gross focal neurological deficits. Sensation intact. Reflexes intact.  SKIN: No ulceration, lesions, rashes, or cyanosis. Skin warm and dry. Turgor intact.  PSYCHIATRIC: Mood, affect within normal limits. The patient is awake, alert and oriented x 3. Insight, judgment intact.          IMAGING    DG Chest 1 View  Result Date: 02/09/2020 CLINICAL DATA:  Shortness of breath.  Cough. EXAM: CHEST  1 VIEW COMPARISON:  Chest x-ray 01/26/2020, 12/11/2019 CT 02/05/2020. FINDINGS: Mediastinum and hilar structures appear stable. Heart size appears stable. Diffuse severe interstitial changes  are again noted again. Chronic interstitial lung disease again could present this fashion. Superimposed acute process including pneumonitis cannot be excluded. No pleural effusion or pneumothorax. IMPRESSION: Diffuse severe interstitial changes are again noted. Chronic interstitial lung disease again could present this fashion. Superimposed acute process including pneumonitis cannot be excluded. No significant interim change from prior studies. Electronically Signed   By: 02/10/2020  Register   On: 02/09/2020 08:25   CT Angio Chest PE W and/or Wo Contrast  Result Date: 01/21/2020 CLINICAL DATA:  Shortness of breath. Evaluate for pulmonary embolism. EXAM: CT ANGIOGRAPHY CHEST WITH CONTRAST TECHNIQUE: Multidetector CT imaging of the chest was performed using the standard protocol during bolus administration of intravenous contrast. Multiplanar CT image reconstructions and MIPs were obtained to evaluate the vascular anatomy. CONTRAST:  30mL OMNIPAQUE IOHEXOL 350 MG/ML SOLN COMPARISON:  Chest CT-12/11/2019; chest radiograph-earlier same day; 12/16/2019 FINDINGS: Vascular Findings:  There is adequate opacification of the pulmonary arterial system with the main pulmonary artery measuring 303 Hounsfield units. There are no discrete filling defects within the pulmonary arterial tree to suggest pulmonary embolism. Borderline enlarged caliber the main pulmonary artery measuring 3.3 cm in diameter (image 44, series 4). Cardiomegaly. No pericardial effusion. Coronary artery calcifications. No evidence of thoracic aortic aneurysm or dissection. Conventional configuration of the aortic arch. The branch vessels of the aortic arch appear patent throughout their imaged courses. There is a minimal amount of atherosclerotic plaque within the descending thoracic aorta, not resulting in a hemodynamically significant stenosis. Review of the MIP images confirms the above findings.  ---------------------------------------------------------------------------------- Nonvascular Findings: Mediastinum/Lymph Nodes: Redemonstrated mediastinal and bilateral hilar lymphadenopathy with index high right posterior paratracheal lymph node measuring 1.1 cm in greatest short axis diameter image 20, series 4), index precarinal lymph node measuring 1.0 cm (image 36), index left suprahilar lymph node measuring 1.2 cm (image 41), index left infrahilar lymph node measuring 1.4 cm (image 52), and index right infrahilar lymph node measuring 1.4 cm (image 58), grossly unchanged compared to the 12/11/2019 examination. No axillary lymphadenopathy. Lungs/Pleura: Improved aeration of the previously noted consolidative opacities within left upper lobe suggestive resolved infection and/or inflammation. Interval worsening extensive though basilar predominant interstitial and now consolidative airspace opacities with associated air bronchograms, progressed even compared to recent chest CT performed 11/2019. There is mild sparing of the left lung apex, with otherwise extensive involvement of the bilateral lungs with basilar predominance and questionable honeycombing within the subpleural aspect of the right lower lobe (representative image 64, series 6). No pleural effusion or pneumothorax. The central pulmonary airways appear patent. There is a punctate granuloma within the right lower lobe (image 69, series 6), unchanged. Evaluation for additional discrete pulmonary nodules is degraded secondary to extensive background parenchymal abnormalities. Upper abdomen: Limited early arterial phase evaluation of the upper abdomen demonstrates a splenule about the anterior tip of the spleen. Musculoskeletal: No acute or aggressive osseous abnormalities. Stigmata of dish within the thoracic spine. IMPRESSION: 1. No evidence of pulmonary embolism. 2. Progression of extensive bilateral interstitial and consolidative opacities, even  compared to recent chest CT performed 12/11/2019, worrisome for progressive fibrotic lung disease, nonspecific though suggestive of UIP given probable honeycombing within the right lower lobe. 3. Improved aeration of previously noted consolidative opacities within the left upper lobe suggestive of resolved infection and/or inflammation. 4. Redemonstrated mediastinal and bilateral hilar lymphadenopathy, nonspecific though presumably reactive in etiology. Electronically Signed   By: Simonne ComeJohn  Watts M.D.   On: Mar 29, 2020 10:37   DG Chest Portable 1 View  Result Date: 10-27-2019 CLINICAL DATA:  Shortness of breath EXAM: PORTABLE CHEST 1 VIEW COMPARISON:  December 16, 2019 FINDINGS: Widespread fibrosis remains. No new opacity is appreciable. Heart is upper normal in size with pulmonary vascularity normal. No adenopathy. No bone lesions IMPRESSION: Widespread fibrosis, essentially stable. It is difficult to exclude a degree of underlying emphysematous change. The appearance is essentially stable compared to prior study. No consolidation evident. Heart upper normal in size. Electronically Signed   By: Bretta BangWilliam  Woodruff III M.D.   On: Mar 29, 2020 07:55      ASSESSMENT/PLAN   Acute exacerbation of Idiopathic pulmonary fibosis   - there is right upper lobe consolidation suggestive of acute infectious cause of current exacerbation   - will obtain serology for ANA comprehensive, cryptococcal antigen, aspergillus ab, fungitell   - Respiratory viral panel    - sputum bacterial cultures   - procalcitonin  trend   - MRSA PCR   - empiric IV dose steroids -increased to solumedrol 80q8h  -patient worked up in clinic and has antifibrotic therapy done-esbriet approval in progress   Atrial fibrillation with Rapid ventricular response  - patient with HR> 150 this am  - on cardizem gtt   - BP bordeline low MAP 80s  - added IV digoxin load followed by 250 po in am then 125 daily po   - cardiology consultation ordered  - appreciate input will follow recs per cardio team  -currently with lovenox ppx dose only    GERD   -this should be very tightly controlled while in acute exacerbation of IPF - will start protonix 40 bid     Thank you for allowing me to participate in the care of this patient.   Patient/Family are satisfied with care plan and all questions have been answered.  This document was prepared using Dragon voice recognition software and may include unintentional dictation errors.     Vida Rigger, M.D.  Division of Pulmonary & Critical Care Medicine  [facility]

## 2020-02-10 DIAGNOSIS — I4891 Unspecified atrial fibrillation: Secondary | ICD-10-CM | POA: Diagnosis present

## 2020-02-10 LAB — BASIC METABOLIC PANEL
Anion gap: 10 (ref 5–15)
BUN: 23 mg/dL (ref 8–23)
CO2: 29 mmol/L (ref 22–32)
Calcium: 9 mg/dL (ref 8.9–10.3)
Chloride: 103 mmol/L (ref 98–111)
Creatinine, Ser: 0.66 mg/dL (ref 0.61–1.24)
GFR calc Af Amer: >60 mL/min (ref >60)
GFR calc non Af Amer: >60 mL/min (ref >60)
Glucose, Bld: 174 mg/dL — ABNORMAL HIGH (ref 70–99)
Potassium: 3.9 mmol/L (ref 3.5–5.1)
Sodium: 142 mmol/L (ref 135–145)

## 2020-02-10 LAB — URINE CULTURE: Culture: NO GROWTH

## 2020-02-10 LAB — CBC
HCT: 37 % — ABNORMAL LOW (ref 39.0–52.0)
Hemoglobin: 11.6 g/dL — ABNORMAL LOW (ref 13.0–17.0)
MCH: 29.2 pg (ref 26.0–34.0)
MCHC: 31.4 g/dL (ref 30.0–36.0)
MCV: 93.2 fL (ref 80.0–100.0)
Platelets: 416 10*3/uL — ABNORMAL HIGH (ref 150–400)
RBC: 3.97 MIL/uL — ABNORMAL LOW (ref 4.22–5.81)
RDW: 13.2 % (ref 11.5–15.5)
WBC: 27.6 10*3/uL — ABNORMAL HIGH (ref 4.0–10.5)
nRBC: 0 % (ref 0.0–0.2)

## 2020-02-10 MED ORDER — METHYLPREDNISOLONE SODIUM SUCC 125 MG IJ SOLR
60.0000 mg | Freq: Three times a day (TID) | INTRAMUSCULAR | Status: DC
  Administered 2020-02-10 – 2020-02-18 (×24): 60 mg via INTRAVENOUS
  Filled 2020-02-10 (×24): qty 2

## 2020-02-10 MED ORDER — SODIUM CHLORIDE 0.9 % IV SOLN
INTRAVENOUS | Status: DC | PRN
  Administered 2020-02-12: 250 mL via INTRAVENOUS

## 2020-02-10 NOTE — Progress Notes (Signed)
   02/09/20 1946  Assess: MEWS Score  Temp 98.2 F (36.8 C)  BP 97/64  Pulse Rate (!) 123  ECG Heart Rate (!) 135  Resp (!) 39  SpO2 93 %  O2 Device HFNC  Heater temperature 87.6 F (30.9 C)  O2 Flow Rate (L/min) 50 L/min  FiO2 (%) 100 %

## 2020-02-10 NOTE — Progress Notes (Signed)
Pulmonary Medicine          Date: 02/10/2020,   MRN# [id number] Donald Sellers [birth year]     AdmissionWeight: 77.9 kg                 CurrentWeight: 80.5 kg  Referring physician: Dr Joylene IgoAgbata   CHIEF COMPLAINT:   Acute hypoxemic respiratory failure   HISTORY OF PRESENT ILLNESS and SUBJECTIVE FINDINGS   12M with no PMH came in with chief complaint of SOB/DOE 2d. He had noted worsening cough and phlegm on expectoration when at home. He reports his Oxygen therapy had stopped working and at 2 am he was on phone with staff attempting to fix his poc. He was able to use oxygen tank. He was asmoker in past. He was found to be acutely hypoxemic but shares that DOE has been progressively getting worse over past 1 year.Initial sat 79% on RA, was placed on 6L [place].  Afebrile, pulse 126, BP 151/92.  Labs notable for WBC 18, Hgb wnl, BNP 151, trop 20 and 20, lactic acid wnl, procal <0.1, COVID neg, CXR showed Extensive bilateral interstitial opacities throughout both lungs. CTA neg for PE, but showed Right-sided volume loss with subpleural architectural distortion/honeycombing. Extensive superimposed ground-glass pulmonary infiltrate and Superimposed consolidation within the left upper lobe.  Pt received ceftriaxone, azithromycin and IV deca 10 mg in the ED before admission.  Pulmonary consultation for further management of fibrotic lung disease with acute hypoxemia. CTPE on this admission is negative with    02/09/20- Patient in AFrVR during evaluation this am.  He was placed on BIPAP and is on cardizem gtt.  He may need closer monitoring since he is now on drip and NIV.  Will attempt to wean off both today and if unable to will advance care to SDU.   02/10/20- patient is improved, he has + bacterial respiratory culture with strep species.   His AFRVR is also improved and he has been seen by cariology.  He shares dyspnea and SOB is somewhat improved and he is off BIPAP during my evaluation.     PAST MEDICAL HISTORY   Past Medical History:  Diagnosis Date  . Patient denies medical problems   . Pulmonary fibrosis (HCC)      SURGICAL HISTORY   Past Surgical History:  Procedure Laterality Date  . EYE SURGERY    . ORIF ANKLE FRACTURE Left 12/28/2019   Procedure: OPEN REDUCTION INTERNAL FIXATION (ORIF) ANKLE FRACTURE;  Surgeon: Lyndle HerrlichBowers, James R, MD;  Location: [facility] ORS;  Service: Orthopedics;  Laterality: Left;     FAMILY HISTORY   Family History  Problem Relation Age of Onset  . Pulmonary fibrosis Neg Hx      SOCIAL HISTORY   Social History   Tobacco Use  . Smoking status: Former Smoker    Packs/day: 1.00    Years: 11.00    Pack years: 11.00    Types: Cigarettes    Quit date: 1980    Years since quitting: 41.7  . Smokeless tobacco: Never Used  Vaping Use  . Vaping Use: Never used  Substance Use Topics  . Alcohol use: Yes  . Drug use: Never     MEDICATIONS    Home Medication:    Current Medication:  Current Facility-Administered Medications:  .  0.9 %  sodium chloride infusion, , Intravenous, PRN, Wouk, Wilfred CurtisNoah Bedford, MD .  ALPRAZolam Prudy Feeler(XANAX) tablet 1 mg, 1 mg, Oral, QHS, Vida RiggerAleskerov, Remmy Crass, MD, 1 mg at  02/09/20 2252 .  digoxin (LANOXIN) tablet 0.25 mg, 0.25 mg, Oral, BID, Adrian Blackwater A, MD, 0.25 mg at 02/10/20 0947 .  diltiazem (CARDIZEM) 125 mg in dextrose 5% 125 mL (1 mg/mL) infusion, 5-15 mg/hr, Intravenous, Titrated, Wouk, Wilfred Curtis, MD, Last Rate: 10 mL/hr at 02/10/20 1008, 10 mg/hr at 02/10/20 1008 .  enoxaparin (LOVENOX) injection 40 mg, 40 mg, Subcutaneous, Q24H, Agbata, Tochukwu, MD, 40 mg at 02/09/20 2253 .  LORazepam (ATIVAN) injection 1 mg, 1 mg, Intravenous, Q6H PRN, Wouk, Wilfred Curtis, MD, 1 mg at 02/10/20 [phone] .  methylPREDNISolone sodium succinate (SOLU-MEDROL) 125 mg/2 mL injection 60 mg, 60 mg, Intravenous, TID, Idonia Zollinger, MD .  montelukast (SINGULAIR) tablet 10 mg, 10 mg, Oral, QHS, Wouk, Wilfred Curtis, MD, 10 mg at  02/09/20 2252 .  pantoprazole (PROTONIX) injection 40 mg, 40 mg, Intravenous, Q24H, Karna Christmas, Normagene Harvie, MD, 40 mg at 02/10/20 0942 .  piperacillin-tazobactam (ZOSYN) IVPB 3.375 g, 3.375 g, Intravenous, Q8H, Tressie Ellis, RPH, Last Rate: 12.5 mL/hr at 02/10/20 1346, 3.375 g at 02/10/20 1346 .  sodium chloride (OCEAN) 0.65 % nasal spray 1 spray, 1 spray, Each Nare, PRN, Wouk, Wilfred Curtis, MD    ALLERGIES   Patient has no known allergies.     REVIEW OF SYSTEMS    Review of Systems:  Gen:  Denies  fever, sweats, chills weigh loss  HEENT: Denies blurred vision, double vision, ear pain, eye pain, hearing loss, nose bleeds, sore throat Cardiac:  No dizziness, chest pain or heaviness, chest tightness,edema Resp:   Denies cough or sputum porduction, shortness of breath,wheezing, hemoptysis,  Gi: Denies swallowing difficulty, stomach pain, nausea or vomiting, diarrhea, constipation, bowel incontinence Gu:  Denies bladder incontinence, burning urine Ext:   Denies Joint pain, stiffness or swelling Skin: Denies  skin rash, easy bruising or bleeding or hives Endoc:  Denies polyuria, polydipsia , polyphagia or weight change Psych:   Denies depression, insomnia or hallucinations   Other:  All other systems negative   VS: BP 113/80   Pulse (!) 137   Temp 97.8 F (36.6 C)   Resp (!) 30   Ht 5\' 10"  (1.778 m)   Wt 80.5 kg   SpO2 91%   BMI 25.45 kg/m      PHYSICAL EXAM    GENERAL:NAD, no fevers, chills, no weakness no fatigue HEAD: Normocephalic, atraumatic.  EYES: Pupils equal, round, reactive to light. Extraocular muscles intact. No scleral icterus.  MOUTH: Moist mucosal membrane. Dentition intact. No abscess noted.  EAR, NOSE, THROAT: Clear without exudates. No external lesions.  NECK: Supple. No thyromegaly. No nodules. No JVD.  PULMONARY: Diffuse coarse rhonchi right sided +wheezes CARDIOVASCULAR: S1 and S2. Regular rate and rhythm. No murmurs, rubs, or gallops. No edema.  Pedal pulses 2+ bilaterally.  GASTROINTESTINAL: Soft, nontender, nondistended. No masses. Positive bowel sounds. No hepatosplenomegaly.  MUSCULOSKELETAL: No swelling, clubbing, or edema. Range of motion full in all extremities.  NEUROLOGIC: Cranial nerves II through XII are intact. No gross focal neurological deficits. Sensation intact. Reflexes intact.  SKIN: No ulceration, lesions, rashes, or cyanosis. Skin warm and dry. Turgor intact.  PSYCHIATRIC: Mood, affect within normal limits. The patient is awake, alert and oriented x 3. Insight, judgment intact.          IMAGING    DG Chest 1 View  Result Date: 02/09/2020 CLINICAL DATA:  Shortness of breath.  Cough. EXAM: CHEST  1 VIEW COMPARISON:  Chest x-ray 01/25/2020, 12/11/2019 CT 01/27/2020. FINDINGS: Mediastinum and hilar  structures appear stable. Heart size appears stable. Diffuse severe interstitial changes are again noted again. Chronic interstitial lung disease again could present this fashion. Superimposed acute process including pneumonitis cannot be excluded. No pleural effusion or pneumothorax. IMPRESSION: Diffuse severe interstitial changes are again noted. Chronic interstitial lung disease again could present this fashion. Superimposed acute process including pneumonitis cannot be excluded. No significant interim change from prior studies. Electronically Signed   By: Maisie Fus  Register   On: 02/09/2020 08:25   CT Angio Chest PE W and/or Wo Contrast  Result Date: 02-25-20 CLINICAL DATA:  Shortness of breath. Evaluate for pulmonary embolism. EXAM: CT ANGIOGRAPHY CHEST WITH CONTRAST TECHNIQUE: Multidetector CT imaging of the chest was performed using the standard protocol during bolus administration of intravenous contrast. Multiplanar CT image reconstructions and MIPs were obtained to evaluate the vascular anatomy. CONTRAST:  16mL OMNIPAQUE IOHEXOL 350 MG/ML SOLN COMPARISON:  Chest CT-12/11/2019; chest radiograph-earlier same day;  12/16/2019 FINDINGS: Vascular Findings: There is adequate opacification of the pulmonary arterial system with the main pulmonary artery measuring 303 Hounsfield units. There are no discrete filling defects within the pulmonary arterial tree to suggest pulmonary embolism. Borderline enlarged caliber the main pulmonary artery measuring 3.3 cm in diameter (image 44, series 4). Cardiomegaly. No pericardial effusion. Coronary artery calcifications. No evidence of thoracic aortic aneurysm or dissection. Conventional configuration of the aortic arch. The branch vessels of the aortic arch appear patent throughout their imaged courses. There is a minimal amount of atherosclerotic plaque within the descending thoracic aorta, not resulting in a hemodynamically significant stenosis. Review of the MIP images confirms the above findings. ---------------------------------------------------------------------------------- Nonvascular Findings: Mediastinum/Lymph Nodes: Redemonstrated mediastinal and bilateral hilar lymphadenopathy with index high right posterior paratracheal lymph node measuring 1.1 cm in greatest short axis diameter image 20, series 4), index precarinal lymph node measuring 1.0 cm (image 36), index left suprahilar lymph node measuring 1.2 cm (image 41), index left infrahilar lymph node measuring 1.4 cm (image 52), and index right infrahilar lymph node measuring 1.4 cm (image 58), grossly unchanged compared to the 12/11/2019 examination. No axillary lymphadenopathy. Lungs/Pleura: Improved aeration of the previously noted consolidative opacities within left upper lobe suggestive resolved infection and/or inflammation. Interval worsening extensive though basilar predominant interstitial and now consolidative airspace opacities with associated air bronchograms, progressed even compared to recent chest CT performed 11/2019. There is mild sparing of the left lung apex, with otherwise extensive involvement of the bilateral  lungs with basilar predominance and questionable honeycombing within the subpleural aspect of the right lower lobe (representative image 64, series 6). No pleural effusion or pneumothorax. The central pulmonary airways appear patent. There is a punctate granuloma within the right lower lobe (image 69, series 6), unchanged. Evaluation for additional discrete pulmonary nodules is degraded secondary to extensive background parenchymal abnormalities. Upper abdomen: Limited early arterial phase evaluation of the upper abdomen demonstrates a splenule about the anterior tip of the spleen. Musculoskeletal: No acute or aggressive osseous abnormalities. Stigmata of dish within the thoracic spine. IMPRESSION: 1. No evidence of pulmonary embolism. 2. Progression of extensive bilateral interstitial and consolidative opacities, even compared to recent chest CT performed 12/11/2019, worrisome for progressive fibrotic lung disease, nonspecific though suggestive of UIP given probable honeycombing within the right lower lobe. 3. Improved aeration of previously noted consolidative opacities within the left upper lobe suggestive of resolved infection and/or inflammation. 4. Redemonstrated mediastinal and bilateral hilar lymphadenopathy, nonspecific though presumably reactive in etiology. Electronically Signed   By: Holland Commons.D.  On: 01/25/2020 10:37   DG Chest Portable 1 View  Result Date: 02/04/2020 CLINICAL DATA:  Shortness of breath EXAM: PORTABLE CHEST 1 VIEW COMPARISON:  December 16, 2019 FINDINGS: Widespread fibrosis remains. No new opacity is appreciable. Heart is upper normal in size with pulmonary vascularity normal. No adenopathy. No bone lesions IMPRESSION: Widespread fibrosis, essentially stable. It is difficult to exclude a degree of underlying emphysematous change. The appearance is essentially stable compared to prior study. No consolidation evident. Heart upper normal in size. Electronically Signed   By: Bretta Bang III M.D.   On: 02/09/2020 07:55      ASSESSMENT/PLAN   Acute exacerbation of Idiopathic pulmonary fibosis   - there is right upper lobe consolidation suggestive of acute infectious cause of current exacerbation   - will obtain serology for ANA comprehensive, cryptococcal antigen, aspergillus ab, fungitell   - Respiratory viral panel    - sputum bacterial cultures   - procalcitonin trend   - MRSA PCR   - empiric IV dose steroids -increased to solumedrol 80q8h  -patient worked up in clinic and has antifibrotic therapy done-esbriet approval in progress   Atrial fibrillation with Rapid ventricular response  - patient with HR> 150 this am  - on cardizem gtt   - BP bordeline low MAP 80s  - s/p cardio evaluation - appreciate input - follow recommendations - cardizem and digoxin    GERD   -this should be very tightly controlled while in acute exacerbation of IPF - will start protonix 40 bid     Thank you for allowing me to participate in the care of this patient.   Patient/Family are satisfied with care plan and all questions have been answered.  This document was prepared using Dragon voice recognition software and may include unintentional dictation errors.     Vida Rigger, M.D.  Division of Pulmonary & Critical Care Medicine  [facility]

## 2020-02-10 NOTE — Hospital Course (Signed)
Donald Sellers is a 69 y.o. male with medical history significant for pulmonary fibrosis on 4 L/min of continuous home oxygen.  He presented to the ED on 02/05/2020 for evaluation of worsening SOB/DOE from his baseline with increased oxygen requirements and associated productive cough.  This in setting of recent diagnosis of pulmonary fibrosis in July after having progressive SOB for 1 year.  Evaluation in the ED showed leukocytosis with left shift, normal lactic acid, preserved renal function, spO2 in low 80's on baseline 4 L/min oxygen and requiring NRD mask.  CTA chest negative for acute PE.  Showed: - Progression of extensive bilateral interstitial and consolidative opacities, even compared to recent chest CT performed 12/11/2019, worrisome for progressive fibrotic lung disease, nonspecific though suggestive of UIP given probable honeycombing within the right lower lobe.   - Improved aeration of previously noted consolidative opacities within the left upper lobe suggestive of resolved infection and/or inflammation.  - Redemonstrated mediastinal and bilateral hilar lymphadenopathy, nonspecific though presumably reactive in etiology.  Treated in the ED with IV steroids, Vancomycin and Cefepime.  Admitted to hospitalist service for further evaluation and mangagement.  Pulmonology consulted.

## 2020-02-10 NOTE — Consult Note (Signed)
Pharmacy Antibiotic Note  Donald Sellers is a 69 y.o. male with medical history significant for pulmonary fibrosis admitted on 04-Mar-2020 with pneumonia. Labs significant for leukocytosis with mild left shift.  Imaging worrisome for progressive fibrotic lung disease; right upper lobe consolidation suggestive of acute infectious cause of current exacerbation. Patient was given one time doses of vancomycin, cefepime, and azithromycin for sepsis. Abx were switched to IV Levaquin for CAP the broadened to Zosyn. Pharmacy has been consulted for Zosyn dosing.  Sputum culture now growing gram variable rod and GPC Day 3 total IV abx; Day 2 zosyn, WBC 18>18.9>27.6, afebrile, Scr<1  Plan: Continue Zosyn 3.375g IV q8h (4 hour infusion) Monitor renal function and adjust as clinically indicated  Height: 5\' 10"  (177.8 cm) Weight: 80.5 kg (177 lb 6.4 oz) IBW/kg (Calculated) : 73  Temp (24hrs), Avg:97.9 F (36.6 C), Min:97.3 F (36.3 C), Max:98.7 F (37.1 C)  Recent Labs  Lab 03-04-2020 0754 02/09/20 0714 02/10/20 0432  WBC 18.0* 18.9* 27.6*  CREATININE 0.63 0.61  --   LATICACIDVEN 1.5  --   --     Estimated Creatinine Clearance: 92.5 mL/min (by C-G formula based on SCr of 0.61 mg/dL).    No Known Allergies  Antimicrobials this admission: Vancomycin/cefepime/azithromycin/Levaquin 9/20 x 1  Zosyn 9/21 >>   Dose adjustments this admission: n/a  Microbiology results: 9/21 UCx: Negative  9/20 BCx: NGTD 9/20 Respiratory panel: Negative 9/20 Sputum: gram variable rod and GPC 9/20 MRSA PCR: Negative  Thank you for allowing pharmacy to be a part of this patient's care.  10/20, PharmD Pharmacy Resident  02/10/2020 6:29 AM

## 2020-02-10 NOTE — Progress Notes (Signed)
PROGRESS NOTE    Donald EvertsBrenton F Delatorre   ZOX:[id number]  DOB: 1950-12-09  PCP: Patient, No Pcp Per    DOA: Mar 24, 2020 LOS: 2   Brief Narrative   Donald Sellers is a 69 y.o. male with medical history significant for pulmonary fibrosis on 4 L/min of continuous home oxygen.  He presented to the ED on 05-26-19 for evaluation of worsening SOB/DOE from his baseline with increased oxygen requirements and associated productive cough.  This in setting of recent diagnosis of pulmonary fibrosis in July after having progressive SOB for 1 year.  Evaluation in the ED showed leukocytosis with left shift, normal lactic acid, preserved renal function, spO2 in low 80's on baseline 4 L/min oxygen and requiring NRD mask.  CTA chest negative for acute PE.  Showed: - Progression of extensive bilateral interstitial and consolidative opacities, even compared to recent chest CT performed 12/11/2019, worrisome for progressive fibrotic lung disease, nonspecific though suggestive of UIP given probable honeycombing within the right lower lobe.   - Improved aeration of previously noted consolidative opacities within the left upper lobe suggestive of resolved infection and/or inflammation.  - Redemonstrated mediastinal and bilateral hilar lymphadenopathy, nonspecific though presumably reactive in etiology.  Treated in the ED with IV steroids, Vancomycin and Cefepime.  Admitted to hospitalist service for further evaluation and mangagement.  Pulmonology consulted.     Assessment & Plan   Principal Problem:   Acute on chronic respiratory failure with hypoxia (HCC) Active Problems:   CAP (community acquired pneumonia)   Sepsis (HCC)   Pulmonary fibrosis (HCC)   Atrial fibrillation with RVR (HCC)   Gastroesophageal reflux disease   Acute on chronic respiratory failure with hypoxia - baseline 4-5 L/min oxygen requirement.  Chronic hypoxia due to pulmonary fibrosis, acutely worse due to CAP.  Requiring 100% FiO2 on  heated HFNC at 50 L/min. --Supplemental O2 as needed, maintain O2 sat > 90% --monitor volume status, consider intermittent Lasix as needed  Severe sepsis secondary to Community acquired pneumonia - sepsis present on admission as evidenced by tachycardia, tachypnea leukocytosis with left shift, imaging findings of worsening opacities consistent with pneumonia.  Organ dysfunction as evidence by acute on chronic hypoxia.   --Continue Zosyn, IV steroids per pulm --follow cultures --Pulmonology following  Acute exacerbation of Pulmonary fibrosis - currently pneumonia complicating underlying PF.  Pulmonology following.   --Continue steroids and oxygen as above  Atrial fibrillation with RVR - present on admission, likely due to underlying respiratory illness.  Cardiology is following.  Continue digoxin, Cardizem infusion.  Telemetry.  GERD - chronic, stable.  On IV PPI with high oxygen requirement.     DVT prophylaxis: enoxaparin (LOVENOX) injection 40 mg Start: 001-05-21 2200   Diet:  Diet Orders (From admission, onward)    Start     Ordered   001-05-21 1131  Diet 2 gram sodium Room service appropriate? Yes; Fluid consistency: Thin  Diet effective now       Question Answer Comment  Room service appropriate? Yes   Fluid consistency: Thin      001-05-21 1132            Code Status: Full Code    Subjective 02/10/20    Patient seen this AM.  Reports appetite much better.  Denies chest pain or palpitations.  No documented fevers.     Disposition Plan & Communication   Status is: Inpatient  Remains inpatient appropriate because:Inpatient level of care appropriate due to severity of illness with high oxygen  requirement, on IV steroids, IV antibiotics.   Dispo: The patient is from: Home              Anticipated d/c is to: Home              Anticipated d/c date is: > 3 days              Patient currently is not medically stable to d/c.        Family Communication: none at  bedside will attempt to call    Consults, Procedures, Significant Events   Consultants:   Pulmonology  Cardiology   Procedures:   None  Antimicrobials:  Anti-infectives (From admission, onward)   Start     Dose/Rate Route Frequency Ordered Stop   02/09/20 1200  piperacillin-tazobactam (ZOSYN) IVPB 3.375 g        3.375 g 12.5 mL/hr over 240 Minutes Intravenous Every 8 hours 02/09/20 0818     02/09/20 1000  azithromycin (ZITHROMAX) 500 mg in sodium chloride 0.9 % 250 mL IVPB  Status:  Discontinued        500 mg 250 mL/hr over 60 Minutes Intravenous Every 24 hours 02/28/20 1132 02/28/2020 1330   02-28-2020 1800  cefTRIAXone (ROCEPHIN) 2 g in sodium chloride 0.9 % 100 mL IVPB  Status:  Discontinued        2 g 200 mL/hr over 30 Minutes Intravenous Every 24 hours 02-28-20 1132 02/28/20 1330   2020-02-28 1400  levofloxacin (LEVAQUIN) IVPB 750 mg  Status:  Discontinued        750 mg 100 mL/hr over 90 Minutes Intravenous Every 24 hours 28-Feb-2020 1330 02/09/20 0814   02-28-20 0815  vancomycin (VANCOCIN) IVPB 1000 mg/200 mL premix        1,000 mg 200 mL/hr over 60 Minutes Intravenous  Once February 28, 2020 0811 February 28, 2020 1029   February 28, 2020 0815  ceFEPIme (MAXIPIME) 2 g in sodium chloride 0.9 % 100 mL IVPB        2 g 200 mL/hr over 30 Minutes Intravenous  Once 02/28/2020 0811 02-28-20 0857   2020-02-28 0815  azithromycin (ZITHROMAX) 500 mg in sodium chloride 0.9 % 250 mL IVPB        500 mg 250 mL/hr over 60 Minutes Intravenous  Once 02-28-2020 0811 02-28-20 1030        Objective   Vitals:   02/10/20 1135 02/10/20 1140 02/10/20 1145 02/10/20 1150  BP:   116/78   Pulse: (!) 149 (!) 153 79 (!) 137  Resp: (!) 38 (!) 51 (!) 35 (!) 36  Temp:   97.8 F (36.6 C)   TempSrc:   Axillary   SpO2: 91% 92% 90% 95%  Weight:      Height:        Intake/Output Summary (Last 24 hours) at 02/10/2020 1155 Last data filed at 02/10/2020 1145 Gross per 24 hour  Intake 720 ml  Output 750 ml  Net -30 ml   Filed  Weights   02/28/20 0743 02/09/20 0000 02/10/20 0357  Weight: 77.9 kg 86.2 kg 80.5 kg    Physical Exam:  General exam: awake, alert, no acute distress Respiratory system: rhonchi R base, fine crackles diffusely, on 50 L/min heated HFNC with spO2 90-92% Cardiovascular system: normal S1/S2, irregularly irregular, no pedal edema.   Gastrointestinal system: soft, NT, ND Central nervous system: A&O x3. no gross focal neurologic deficits, normal speech Extremities: moves all, no edema, normal tone Psychiatry: normal mood, congruent affect  Labs   Data  Reviewed: I have personally reviewed following labs and imaging studies  CBC: Recent Labs  Lab 2020-02-17 0754 02/09/20 0714 02/10/20 0432  WBC 18.0* 18.9* 27.6*  NEUTROABS 13.8*  --   --   HGB 13.4 11.9* 11.6*  HCT 39.8 36.8* 37.0*  MCV 87.1 90.6 93.2  PLT 432* 378 416*   Basic Metabolic Panel: Recent Labs  Lab 02/17/20 0754 02/09/20 0714 02/09/20 1039 02/10/20 0432  NA 133* 140  --  142  K 3.8 4.5  --  3.9  CL 95* 102  --  103  CO2 26 28  --  29  GLUCOSE 132* 160*  --  174*  BUN 8 16  --  23  CREATININE 0.63 0.61  --  0.66  CALCIUM 8.9 9.1  --  9.0  MG  --   --  2.2  --    GFR: Estimated Creatinine Clearance: 92.5 mL/min (by C-G formula based on SCr of 0.66 mg/dL). Liver Function Tests: Recent Labs  Lab 17-Feb-2020 0754  AST 31  ALT 28  ALKPHOS 112  BILITOT 1.1  PROT 7.9  ALBUMIN 3.3*   No results for input(s): LIPASE, AMYLASE in the last 168 hours. No results for input(s): AMMONIA in the last 168 hours. Coagulation Profile: Recent Labs  Lab Feb 17, 2020 0754  INR 1.2   Cardiac Enzymes: No results for input(s): CKTOTAL, CKMB, CKMBINDEX, TROPONINI in the last 168 hours. BNP (last 3 results) No results for input(s): PROBNP in the last 8760 hours. HbA1C: No results for input(s): HGBA1C in the last 72 hours. CBG: Recent Labs  Lab 02/09/20 0121  GLUCAP 148*   Lipid Profile: No results for input(s): CHOL,  HDL, LDLCALC, TRIG, CHOLHDL, LDLDIRECT in the last 72 hours. Thyroid Function Tests: Recent Labs    02/09/20 0714  TSH 1.610   Anemia Panel: No results for input(s): VITAMINB12, FOLATE, FERRITIN, TIBC, IRON, RETICCTPCT in the last 72 hours. Sepsis Labs: Recent Labs  Lab 02/17/20 0754  LATICACIDVEN 1.5    Recent Results (from the past 240 hour(s))  Blood Culture (routine x 2)     Status: None (Preliminary result)   Collection Time: Feb 17, 2020  7:54 AM   Specimen: BLOOD  Result Value Ref Range Status   Specimen Description BLOOD BLOOD LEFT ARM  Final   Special Requests   Final    BOTTLES DRAWN AEROBIC AND ANAEROBIC Blood Culture adequate volume   Culture   Final    NO GROWTH 2 DAYS Performed at [facility], [address] [place], [place] [address]    Report Status PENDING  Incomplete  Blood Culture (routine x 2)     Status: None (Preliminary result)   Collection Time: Feb 17, 2020  8:01 AM   Specimen: BLOOD  Result Value Ref Range Status   Specimen Description BLOOD BLOOD LEFT HAND  Final   Special Requests   Final    BOTTLES DRAWN AEROBIC AND ANAEROBIC Blood Culture results may not be optimal due to an excessive volume of blood received in culture bottles   Culture   Final    NO GROWTH 2 DAYS Performed at [facility], [address] [place], [place] [address]    Report Status PENDING  Incomplete  SARS Coronavirus 2 by RT PCR (hospital order, performed in [facility] hospital lab) Nasopharyngeal Nasopharyngeal Swab     Status: None   Collection Time: 02/17/20  8:01 AM   Specimen: Nasopharyngeal Swab  Result Value Ref Range Status   SARS  Coronavirus 2 NEGATIVE NEGATIVE Final    Comment: (NOTE) SARS-CoV-2 target nucleic acids are NOT DETECTED.  The SARS-CoV-2 RNA is generally detectable in upper and lower respiratory specimens during the acute phase of infection. The lowest concentration of SARS-CoV-2 viral copies this assay can detect is 250 copies  / mL. A negative result does not preclude SARS-CoV-2 infection and should not be used as the sole basis for treatment or other patient management decisions.  A negative result may occur with improper specimen collection / handling, submission of specimen other than nasopharyngeal swab, presence of viral mutation(s) within the areas targeted by this assay, and inadequate number of viral copies (<250 copies / mL). A negative result must be combined with clinical observations, patient history, and epidemiological information.  Fact Sheet for Patients:   BoilerBrush.com.cy  Fact Sheet for Healthcare Providers: https://pope.com/  This test is not yet approved or  cleared by the [place] FDA and has been authorized for detection and/or diagnosis of SARS-CoV-2 by FDA under an Emergency Use Authorization (EUA).  This EUA will remain in effect (meaning this test can be used) for the duration of the COVID-19 declaration under Section 564(b)(1) of the Act, 21 U.S.C. section 360bbb-3(b)(1), unless the authorization is terminated or revoked sooner.  Performed at [facility], [address] [place], [place] [address]   Respiratory Panel by PCR     Status: None   Collection Time: 22-Feb-2020 11:39 PM   Specimen: Nasopharyngeal Swab; Respiratory  Result Value Ref Range Status   Adenovirus NOT DETECTED NOT DETECTED Final   Coronavirus 229E NOT DETECTED NOT DETECTED Final    Comment: (NOTE) The Coronavirus on the Respiratory Panel, DOES NOT test for the novel  Coronavirus (2019 nCoV)    Coronavirus HKU1 NOT DETECTED NOT DETECTED Final   Coronavirus NL63 NOT DETECTED NOT DETECTED Final   Coronavirus OC43 NOT DETECTED NOT DETECTED Final   Metapneumovirus NOT DETECTED NOT DETECTED Final   Rhinovirus / Enterovirus NOT DETECTED NOT DETECTED Final   Influenza A NOT DETECTED NOT DETECTED Final   Influenza B NOT DETECTED NOT DETECTED Final     Parainfluenza Virus 1 NOT DETECTED NOT DETECTED Final   Parainfluenza Virus 2 NOT DETECTED NOT DETECTED Final   Parainfluenza Virus 3 NOT DETECTED NOT DETECTED Final   Parainfluenza Virus 4 NOT DETECTED NOT DETECTED Final   Respiratory Syncytial Virus NOT DETECTED NOT DETECTED Final   Bordetella pertussis NOT DETECTED NOT DETECTED Final   Chlamydophila pneumoniae NOT DETECTED NOT DETECTED Final   Mycoplasma pneumoniae NOT DETECTED NOT DETECTED Final    Comment: Performed at [facility], [address] [place], [place] [address]  MRSA PCR Screening     Status: None   Collection Time: 03/07/2020 11:39 PM   Specimen: Nasal Mucosa; Nasopharyngeal  Result Value Ref Range Status   MRSA by PCR NEGATIVE NEGATIVE Final    Comment:        The GeneXpert MRSA Assay (FDA approved for NASAL specimens only), is one component of a comprehensive MRSA colonization surveillance program. It is not intended to diagnose MRSA infection nor to guide or monitor treatment for MRSA infections. Performed at [facility], [address] [place], [place] [address]   Urine culture     Status: None   Collection Time: 02/09/20  2:08 AM   Specimen: In/Out Cath Urine  Result Value Ref Range Status   Specimen Description   Final    IN/OUT CATH URINE Performed  at [facility], [address], [place], [place] [address]    Special Requests   Final    NONE Performed at [facility], [address] [place], [place] [address]    Culture   Final    NO GROWTH Performed at [facility], [address], [place], [place] [address]    Report Status 02/10/2020 FINAL  Final  Sputum culture     Status: None   Collection Time: 02/09/20  7:14 AM   Specimen: Expectorated Sputum  Result Value Ref Range Status   Specimen Description EXPECTORATED SPUTUM  Final   Special Requests NONE  Final   Sputum evaluation   Final    THIS SPECIMEN IS ACCEPTABLE FOR SPUTUM  CULTURE Performed at [facility], [address] [place], [place] [address]    Report Status 02/09/2020 FINAL  Final  Culture, respiratory     Status: None (Preliminary result)   Collection Time: 02/09/20  7:14 AM  Result Value Ref Range Status   Specimen Description   Final    EXPECTORATED SPUTUM Performed at [facility], [address], [place], [place] [address]    Special Requests   Final    NONE Reflexed from [phone] Performed at [facility], [address] [place], [place] [address]    Gram Stain   Final    RARE WBC PRESENT, PREDOMINANTLY PMN ABUNDANT GRAM VARIABLE ROD FEW GRAM POSITIVE COCCI    Culture   Final    NO GROWTH < 24 HOURS Performed at [facility], [address] [place], [place] [address]    Report Status PENDING  Incomplete      Imaging Studies   DG Chest 1 View  Result Date: 02/09/2020 CLINICAL DATA:  Shortness of breath.  Cough. EXAM: CHEST  1 VIEW COMPARISON:  Chest x-ray 03/03/20, 12/11/2019 CT Mar 03, 2020. FINDINGS: Mediastinum and hilar structures appear stable. Heart size appears stable. Diffuse severe interstitial changes are again noted again. Chronic interstitial lung disease again could present this fashion. Superimposed acute process including pneumonitis cannot be excluded. No pleural effusion or pneumothorax. IMPRESSION: Diffuse severe interstitial changes are again noted. Chronic interstitial lung disease again could present this fashion. Superimposed acute process including pneumonitis cannot be excluded. No significant interim change from prior studies. Electronically Signed   By: Maisie Fus  Register   On: 02/09/2020 08:25     Medications   Scheduled Meds: . ALPRAZolam  1 mg Oral QHS  . digoxin  0.25 mg Oral BID  . enoxaparin (LOVENOX) injection  40 mg Subcutaneous Q24H  . methylPREDNISolone (SOLU-MEDROL) injection  80 mg Intravenous TID  . montelukast  10 mg Oral QHS  . pantoprazole (PROTONIX) IV   40 mg Intravenous Q24H   Continuous Infusions: . sodium chloride    . diltiazem (CARDIZEM) infusion 10 mg/hr (02/10/20 1008)  . piperacillin-tazobactam (ZOSYN)  IV 3.375 g (02/10/20 0520)       LOS: 2 days    Time spent: 30 minutes    Pennie Banter, DO [facility]  02/10/2020, 11:55 AM    If 7PM-7AM, please contact night-coverage. How to contact the [facility] Attending or Consulting provider 7A - 7P or covering provider during after hours 7P -7A, for this patient?    1. Check the care team in [facility] and look for a) attending/consulting [facility] provider listed and b) the [facility] team listed 2. Log into [email or web address] and use [place]'s universal password to access. If you  do not have the password, please contact the hospital operator. 3. Locate the [facility] provider you are looking for under [facility] and page to a number that you can be directly reached. 4. If you still have difficulty reaching the provider, please page the [facility] (Director on Call) for the Hospitalists listed on amion for assistance.

## 2020-02-10 NOTE — Progress Notes (Signed)
SUBJECTIVE: Patient resting in bed. Denies chest pain or dizziness. Has chronic dyspnea, but states he is breathing comfortably at this time on NRB and HFNC 100%/50L.   Vitals:   02/10/20 0220 02/10/20 0357 02/10/20 0816 02/10/20 1000  BP: 103/60  120/71 (!) 133/102  Pulse: (!) 122  (!) 118 (!) 37  Resp:   (!) 26 (!) 37  Temp: (!) 97.3 F (36.3 C) 97.7 F (36.5 C) 97.8 F (36.6 C)   TempSrc: Oral Axillary Axillary   SpO2: 92%  92% (!) 83%  Weight:  80.5 kg    Height:        Intake/Output Summary (Last 24 hours) at 02/10/2020 1057 Last data filed at 02/09/2020 1840 Gross per 24 hour  Intake 720 ml  Output 750 ml  Net -30 ml    LABS: Basic Metabolic Panel: Recent Labs    02/09/20 0714 02/09/20 1039 02/10/20 0432  NA 140  --  142  K 4.5  --  3.9  CL 102  --  103  CO2 28  --  29  GLUCOSE 160*  --  174*  BUN 16  --  23  CREATININE 0.61  --  0.66  CALCIUM 9.1  --  9.0  MG  --  2.2  --    Liver Function Tests: Recent Labs    01/26/2020 0754  AST 31  ALT 28  ALKPHOS 112  BILITOT 1.1  PROT 7.9  ALBUMIN 3.3*   No results for input(s): LIPASE, AMYLASE in the last 72 hours. CBC: Recent Labs    01/28/2020 0754 02/13/2020 0754 02/09/20 0714 02/10/20 0432  WBC 18.0*   < > 18.9* 27.6*  NEUTROABS 13.8*  --   --   --   HGB 13.4   < > 11.9* 11.6*  HCT 39.8   < > 36.8* 37.0*  MCV 87.1   < > 90.6 93.2  PLT 432*   < > 378 416*   < > = values in this interval not displayed.   Cardiac Enzymes: No results for input(s): CKTOTAL, CKMB, CKMBINDEX, TROPONINI in the last 72 hours. BNP: Invalid input(s): POCBNP D-Dimer: No results for input(s): DDIMER in the last 72 hours. Hemoglobin A1C: No results for input(s): HGBA1C in the last 72 hours. Fasting Lipid Panel: No results for input(s): CHOL, HDL, LDLCALC, TRIG, CHOLHDL, LDLDIRECT in the last 72 hours. Thyroid Function Tests: Recent Labs    02/09/20 0714  TSH 1.610   Anemia Panel: No results for input(s): VITAMINB12,  FOLATE, FERRITIN, TIBC, IRON, RETICCTPCT in the last 72 hours.   PHYSICAL EXAM General: Well developed, well nourished, in no acute distress HEENT:  Normocephalic and atramatic Neck:  No JVD.  Lungs: Diminished bilaterally to auscultation and percussion. Heart: Atrial Fibrillation.  Abdomen: Bowel sounds are positive, abdomen soft and non-tender  Msk:  Back normal, normal gait. Normal strength and tone for age. Extremities: No clubbing, cyanosis or edema.   Neuro: Alert and oriented X 3. Psych:  Good affect, responds appropriately  TELEMETRY: Atrial fibrillation 110s  ASSESSMENT AND PLAN: A. fib with rapid ventricular response rate with underlying pulmonary fibrosis and hypoxia and respiratory failure / infection. RVR mildly improved to 110s this morning. Patient given digoxin bolus yesterday and continuing bid for now with plan to decrease to daily tomorrow. Check digoxin level tomorrow AM. Continue to up-titrate Diltiazem infusion as BP allows. The patient is not a candidate for IV amiodarone because of pulmonary fibrosis and is also not  a candidate for Tikosyn or sotalol since QTC is over 490 on EKG. As patient remains in atrial fibrillation, will initiate eliquis. Will continue to follow   Principal Problem:   Sepsis [facility]) Active Problems:   Gastroesophageal reflux disease   CAP (community acquired pneumonia)   Pulmonary fibrosis (HCC)   Acute on chronic respiratory failure with hypoxia (HCC)    Maryelizabeth Kaufmann, NP-C 02/10/2020 10:57 AM

## 2020-02-11 ENCOUNTER — Inpatient Hospital Stay: Payer: BC Managed Care – PPO

## 2020-02-11 DIAGNOSIS — J8410 Pulmonary fibrosis, unspecified: Secondary | ICD-10-CM

## 2020-02-11 DIAGNOSIS — J9621 Acute and chronic respiratory failure with hypoxia: Secondary | ICD-10-CM

## 2020-02-11 DIAGNOSIS — I4891 Unspecified atrial fibrillation: Secondary | ICD-10-CM

## 2020-02-11 LAB — MRSA PCR SCREENING: MRSA by PCR: NEGATIVE

## 2020-02-11 LAB — CBC WITH DIFFERENTIAL/PLATELET
Abs Immature Granulocytes: 0.28 10*3/uL — ABNORMAL HIGH (ref 0.00–0.07)
Basophils Absolute: 0.1 10*3/uL (ref 0.0–0.1)
Basophils Relative: 0 %
Eosinophils Absolute: 0 10*3/uL (ref 0.0–0.5)
Eosinophils Relative: 0 %
HCT: 40.8 % (ref 39.0–52.0)
Hemoglobin: 12.6 g/dL — ABNORMAL LOW (ref 13.0–17.0)
Immature Granulocytes: 1 %
Lymphocytes Relative: 3 %
Lymphs Abs: 0.9 10*3/uL (ref 0.7–4.0)
MCH: 28.8 pg (ref 26.0–34.0)
MCHC: 30.9 g/dL (ref 30.0–36.0)
MCV: 93.4 fL (ref 80.0–100.0)
Monocytes Absolute: 2.1 10*3/uL — ABNORMAL HIGH (ref 0.1–1.0)
Monocytes Relative: 8 %
Neutro Abs: 24.7 10*3/uL — ABNORMAL HIGH (ref 1.7–7.7)
Neutrophils Relative %: 88 %
Platelets: 487 10*3/uL — ABNORMAL HIGH (ref 150–400)
RBC: 4.37 MIL/uL (ref 4.22–5.81)
RDW: 13.2 % (ref 11.5–15.5)
WBC: 28 10*3/uL — ABNORMAL HIGH (ref 4.0–10.5)
nRBC: 0 % (ref 0.0–0.2)

## 2020-02-11 LAB — RENAL FUNCTION PANEL
Albumin: 3 g/dL — ABNORMAL LOW (ref 3.5–5.0)
Anion gap: 11 (ref 5–15)
BUN: 21 mg/dL (ref 8–23)
CO2: 33 mmol/L — ABNORMAL HIGH (ref 22–32)
Calcium: 8.6 mg/dL — ABNORMAL LOW (ref 8.9–10.3)
Chloride: 99 mmol/L (ref 98–111)
Creatinine, Ser: 0.52 mg/dL — ABNORMAL LOW (ref 0.61–1.24)
GFR calc Af Amer: >60 mL/min (ref >60)
GFR calc non Af Amer: >60 mL/min (ref >60)
Glucose, Bld: 191 mg/dL — ABNORMAL HIGH (ref 70–99)
Phosphorus: 3.6 mg/dL (ref 2.5–4.6)
Potassium: 4.4 mmol/L (ref 3.5–5.1)
Sodium: 143 mmol/L (ref 135–145)

## 2020-02-11 LAB — BLOOD GAS, ARTERIAL
Acid-Base Excess: 9 mmol/L — ABNORMAL HIGH (ref 0.0–2.0)
Bicarbonate: 38.5 mmol/L — ABNORMAL HIGH (ref 20.0–28.0)
FIO2: 1
Mechanical Rate: 30
O2 Saturation: 98.2 %
PEEP: 8 cmH2O
Patient temperature: 37
Pressure control: 27 cmH2O
pCO2 arterial: 80 mmHg (ref 32.0–48.0)
pH, Arterial: 7.29 — ABNORMAL LOW (ref 7.350–7.450)
pO2, Arterial: 119 mmHg — ABNORMAL HIGH (ref 83.0–108.0)

## 2020-02-11 LAB — BASIC METABOLIC PANEL
Anion gap: 9 (ref 5–15)
BUN: 22 mg/dL (ref 8–23)
CO2: 32 mmol/L (ref 22–32)
Calcium: 8.8 mg/dL — ABNORMAL LOW (ref 8.9–10.3)
Chloride: 99 mmol/L (ref 98–111)
Creatinine, Ser: 0.55 mg/dL — ABNORMAL LOW (ref 0.61–1.24)
GFR calc Af Amer: >60 mL/min (ref >60)
GFR calc non Af Amer: >60 mL/min (ref >60)
Glucose, Bld: 151 mg/dL — ABNORMAL HIGH (ref 70–99)
Potassium: 4.2 mmol/L (ref 3.5–5.1)
Sodium: 140 mmol/L (ref 135–145)

## 2020-02-11 LAB — CBC
HCT: 36.7 % — ABNORMAL LOW (ref 39.0–52.0)
Hemoglobin: 11.6 g/dL — ABNORMAL LOW (ref 13.0–17.0)
MCH: 29 pg (ref 26.0–34.0)
MCHC: 31.6 g/dL (ref 30.0–36.0)
MCV: 91.8 fL (ref 80.0–100.0)
Platelets: 412 10*3/uL — ABNORMAL HIGH (ref 150–400)
RBC: 4 MIL/uL — ABNORMAL LOW (ref 4.22–5.81)
RDW: 13.2 % (ref 11.5–15.5)
WBC: 25 10*3/uL — ABNORMAL HIGH (ref 4.0–10.5)
nRBC: 0 % (ref 0.0–0.2)

## 2020-02-11 LAB — CULTURE, RESPIRATORY W GRAM STAIN: Culture: NORMAL

## 2020-02-11 LAB — BRAIN NATRIURETIC PEPTIDE: B Natriuretic Peptide: 235.9 pg/mL — ABNORMAL HIGH (ref 0.0–100.0)

## 2020-02-11 LAB — MAGNESIUM: Magnesium: 2.6 mg/dL — ABNORMAL HIGH (ref 1.7–2.4)

## 2020-02-11 LAB — DIGOXIN LEVEL: Digoxin Level: 1.4 ng/mL (ref 1.0–2.0)

## 2020-02-11 LAB — PHOSPHORUS: Phosphorus: 3.5 mg/dL (ref 2.5–4.6)

## 2020-02-11 LAB — GLUCOSE, CAPILLARY: Glucose-Capillary: 157 mg/dL — ABNORMAL HIGH (ref 70–99)

## 2020-02-11 LAB — TRIGLYCERIDES: Triglycerides: 110 mg/dL (ref <150)

## 2020-02-11 MED ORDER — APIXABAN 5 MG PO TABS
5.0000 mg | ORAL_TABLET | Freq: Two times a day (BID) | ORAL | Status: DC
  Filled 2020-02-11: qty 1

## 2020-02-11 MED ORDER — MIDAZOLAM HCL 2 MG/2ML IJ SOLN
INTRAMUSCULAR | Status: AC
  Administered 2020-02-11: 2 mg via INTRAVENOUS
  Filled 2020-02-11: qty 2

## 2020-02-11 MED ORDER — LORAZEPAM 2 MG/ML IJ SOLN
1.0000 mg | INTRAMUSCULAR | Status: DC | PRN
  Administered 2020-02-11 – 2020-02-22 (×7): 1 mg via INTRAVENOUS
  Filled 2020-02-11 (×8): qty 1

## 2020-02-11 MED ORDER — FENTANYL 2500MCG IN NS 250ML (10MCG/ML) PREMIX INFUSION
0.0000 ug/h | INTRAVENOUS | Status: DC
  Administered 2020-02-11 – 2020-02-13 (×2): 50 ug/h via INTRAVENOUS
  Administered 2020-02-14: 100 ug/h via INTRAVENOUS
  Filled 2020-02-11 (×4): qty 250

## 2020-02-11 MED ORDER — ORAL CARE MOUTH RINSE
15.0000 mL | OROMUCOSAL | Status: DC
  Administered 2020-02-11 – 2020-02-22 (×107): 15 mL via OROMUCOSAL

## 2020-02-11 MED ORDER — MIDAZOLAM HCL 2 MG/2ML IJ SOLN: 2.0000 mg | Freq: Once | INTRAMUSCULAR | Status: AC

## 2020-02-11 MED ORDER — DIGOXIN 250 MCG PO TABS
0.2500 mg | ORAL_TABLET | Freq: Every day | ORAL | Status: DC
  Filled 2020-02-11: qty 1

## 2020-02-11 MED ORDER — FENTANYL CITRATE (PF) 100 MCG/2ML IJ SOLN
INTRAMUSCULAR | Status: AC
  Administered 2020-02-11: 50 ug via INTRAVENOUS
  Filled 2020-02-11: qty 2

## 2020-02-11 MED ORDER — DEXMEDETOMIDINE HCL IN NACL 400 MCG/100ML IV SOLN
0.4000 ug/kg/h | INTRAVENOUS | Status: DC
  Filled 2020-02-11: qty 100

## 2020-02-11 MED ORDER — FENTANYL CITRATE (PF) 100 MCG/2ML IJ SOLN: 50.0000 ug | Freq: Once | INTRAMUSCULAR | Status: AC

## 2020-02-11 MED ORDER — PROPOFOL 1000 MG/100ML IV EMUL
5.0000 ug/kg/min | INTRAVENOUS | Status: DC
  Administered 2020-02-11: 5 ug/kg/min via INTRAVENOUS
  Administered 2020-02-12 (×2): 10 ug/kg/min via INTRAVENOUS
  Administered 2020-02-13: 25 ug/kg/min via INTRAVENOUS
  Administered 2020-02-13: 30 ug/kg/min via INTRAVENOUS
  Administered 2020-02-14 – 2020-02-15 (×6): 40 ug/kg/min via INTRAVENOUS
  Administered 2020-02-15: 30 ug/kg/min via INTRAVENOUS
  Administered 2020-02-15: 40 ug/kg/min via INTRAVENOUS
  Administered 2020-02-15: 30 ug/kg/min via INTRAVENOUS
  Administered 2020-02-16: 40 ug/kg/min via INTRAVENOUS
  Administered 2020-02-16 (×2): 60 ug/kg/min via INTRAVENOUS
  Administered 2020-02-16: 40 ug/kg/min via INTRAVENOUS
  Administered 2020-02-16 (×2): 60 ug/kg/min via INTRAVENOUS
  Administered 2020-02-17: 45 ug/kg/min via INTRAVENOUS
  Administered 2020-02-17 – 2020-02-18 (×12): 60 ug/kg/min via INTRAVENOUS
  Administered 2020-02-19: 50 ug/kg/min via INTRAVENOUS
  Administered 2020-02-19: 41.408 ug/kg/min via INTRAVENOUS
  Administered 2020-02-19: 60 ug/kg/min via INTRAVENOUS
  Administered 2020-02-19: 50 ug/kg/min via INTRAVENOUS
  Administered 2020-02-19 (×2): 60 ug/kg/min via INTRAVENOUS
  Administered 2020-02-20: 50 ug/kg/min via INTRAVENOUS
  Administered 2020-02-20: 55 ug/kg/min via INTRAVENOUS
  Administered 2020-02-20: 45 ug/kg/min via INTRAVENOUS
  Administered 2020-02-20: 55 ug/kg/min via INTRAVENOUS
  Administered 2020-02-20: 50.104 ug/kg/min via INTRAVENOUS
  Administered 2020-02-20: 50 ug/kg/min via INTRAVENOUS
  Administered 2020-02-21 (×4): 30 ug/kg/min via INTRAVENOUS
  Administered 2020-02-22 (×2): 70 ug/kg/min via INTRAVENOUS
  Administered 2020-02-22: 80 ug/kg/min via INTRAVENOUS
  Administered 2020-02-22 (×2): 70 ug/kg/min via INTRAVENOUS
  Administered 2020-02-22: 30 ug/kg/min via INTRAVENOUS
  Filled 2020-02-11 (×57): qty 100

## 2020-02-11 MED ORDER — ETOMIDATE 2 MG/ML IV SOLN
INTRAVENOUS | Status: AC
  Administered 2020-02-11: 20 mg via INTRAVENOUS
  Filled 2020-02-11: qty 10

## 2020-02-11 MED ORDER — ROCURONIUM BROMIDE 50 MG/5ML IV SOLN: 40.0000 mg | Freq: Once | INTRAVENOUS | Status: AC

## 2020-02-11 MED ORDER — IPRATROPIUM-ALBUTEROL 0.5-2.5 (3) MG/3ML IN SOLN
3.0000 mL | Freq: Four times a day (QID) | RESPIRATORY_TRACT | Status: DC
  Administered 2020-02-11 – 2020-02-16 (×15): 3 mL via RESPIRATORY_TRACT
  Filled 2020-02-11 (×17): qty 3

## 2020-02-11 MED ORDER — HEPARIN BOLUS VIA INFUSION
4000.0000 [IU] | Freq: Once | INTRAVENOUS | Status: AC
  Administered 2020-02-11: 4000 [IU] via INTRAVENOUS
  Filled 2020-02-11: qty 4000

## 2020-02-11 MED ORDER — HEPARIN (PORCINE) 25000 UT/250ML-% IV SOLN
1150.0000 [IU]/h | INTRAVENOUS | Status: DC
  Administered 2020-02-11 – 2020-02-15 (×5): 1150 [IU]/h via INTRAVENOUS
  Filled 2020-02-11 (×6): qty 250

## 2020-02-11 MED ORDER — DIGOXIN 0.25 MG/ML IJ SOLN
0.2500 mg | Freq: Every day | INTRAMUSCULAR | Status: DC
  Administered 2020-02-11 – 2020-02-14 (×4): 0.25 mg via INTRAVENOUS
  Filled 2020-02-11 (×4): qty 2

## 2020-02-11 MED ORDER — SODIUM CHLORIDE 0.9 % IV SOLN
100.0000 mg | Freq: Two times a day (BID) | INTRAVENOUS | Status: DC
  Administered 2020-02-11 – 2020-02-15 (×9): 100 mg via INTRAVENOUS
  Filled 2020-02-11 (×13): qty 100

## 2020-02-11 MED ORDER — CHLORHEXIDINE GLUCONATE CLOTH 2 % EX PADS
6.0000 | MEDICATED_PAD | Freq: Every day | CUTANEOUS | Status: DC
  Administered 2020-02-11 – 2020-02-22 (×11): 6 via TOPICAL

## 2020-02-11 MED ORDER — ROCURONIUM BROMIDE 50 MG/5ML IV SOLN
INTRAVENOUS | Status: AC
  Administered 2020-02-11: 40 mg via INTRAVENOUS
  Filled 2020-02-11: qty 1

## 2020-02-11 MED ORDER — CHLORHEXIDINE GLUCONATE 0.12% ORAL RINSE (MEDLINE KIT)
15.0000 mL | Freq: Two times a day (BID) | OROMUCOSAL | Status: DC
  Administered 2020-02-11 – 2020-02-22 (×21): 15 mL via OROMUCOSAL

## 2020-02-11 MED ORDER — ETOMIDATE 2 MG/ML IV SOLN: 20.0000 mg | Freq: Once | INTRAVENOUS | Status: AC

## 2020-02-11 NOTE — Consult Note (Signed)
Pharmacy Antibiotic Note  Donald Sellers is a 69 y.o. male with medical history significant for pulmonary fibrosis admitted on 02/06/2020 with pneumonia. Labs significant for leukocytosis with mild left shift.  Imaging worrisome for progressive fibrotic lung disease; right upper lobe consolidation suggestive of acute infectious cause of current exacerbation. Patient was given one time doses of vancomycin, cefepime, and azithromycin for sepsis. Abx were switched to IV Levaquin for CAP the broadened to Zosyn. Pharmacy has been consulted for Zosyn dosing.  Sputum culture growing gram variable rod and GPC. Strep pneumo and legionella antigen pending.  Day 4 total IV abx; Day 3 zosyn, WBC 18>18.9>27.6>25, afebrile, Scr<1 Patients oxygen requirements have increased; was previously on HFNC and now requiring BiPAP. IV doxycyline added for atypical coverage.   Plan: Continue Zosyn 3.375g IV q8h (4 hour infusion) Monitor renal function and adjust as clinically indicated  Height: 5\' 10"  (177.8 cm) Weight:  (patient could not be weight at the time. nurse notified.) IBW/kg (Calculated) : 73  Temp (24hrs), Avg:98.1 F (36.7 C), Min:97.6 F (36.4 C), Max:98.8 F (37.1 C)  Recent Labs  Lab 01/28/2020 0754 02/09/20 0714 02/10/20 0432 02/11/20 0335  WBC 18.0* 18.9* 27.6* 25.0*  CREATININE 0.63 0.61 0.66 0.55*  LATICACIDVEN 1.5  --   --   --     Estimated Creatinine Clearance: 92.5 mL/min (A) (by C-G formula based on SCr of 0.55 mg/dL (L)).    No Known Allergies  Antimicrobials this admission: Vancomycin/cefepime/azithromycin/Levaquin 9/20 x 1  Zosyn 9/21 >>  Doxycycline 9/23  Dose adjustments this admission: n/a  Microbiology results: 9/21 UCx: Negative  9/20 BCx: NGTD 9/20 Respiratory panel: Negative 9/20 Sputum: gram variable rod and GPC 9/20 MRSA PCR: Negative 9/23 Strep pneumo and legionella pending  Thank you for allowing pharmacy to be a part of this patient's care.  10/23, PharmD Pharmacy Resident  02/11/2020 12:54 PM

## 2020-02-11 NOTE — Consult Note (Signed)
ANTICOAGULATION CONSULT NOTE - Initial Consult  Pharmacy Consult for Heparin infusion Indication: atrial fibrillation  No Known Allergies  Patient Measurements: Height: 5\' 10"  (177.8 cm) Weight:  (patient could not be weight at the time. nurse notified.) IBW/kg (Calculated) : 73 Heparin Dosing Weight: 80.5 kg  Vital Signs: Temp: 98.1 F (36.7 C) (09/23 1200) Temp Source: Axillary (09/23 1200) BP: 116/83 (09/23 1200) Pulse Rate: 99 (09/23 1426)  Labs: Recent Labs    02/09/20 0714 02/09/20 0714 02/09/20 1039 02/10/20 0432 02/11/20 0335  HGB 11.9*   < >  --  11.6* 11.6*  HCT 36.8*  --   --  37.0* 36.7*  PLT 378  --   --  416* 412*  CREATININE 0.61  --   --  0.66 0.55*  TROPONINIHS 5  --  8  --   --    < > = values in this interval not displayed.    Estimated Creatinine Clearance: 92.5 mL/min (A) (by C-G formula based on SCr of 0.55 mg/dL (L)).   Medical History: Past Medical History:  Diagnosis Date  . Patient denies medical problems   . Pulmonary fibrosis (HCC)     Medications:  Per chart review, no PTA anticoagulation  Assessment: 69 year old male with new onset Afib started on diltiazem drip and digoxin with PMH significant for pulmonary fibrosis. Patient was originally going to receive anticoagulation with Apixaban, however is unable to take medications by mouth due to inability to remove BiPAP. Pharmacy has been consulted for heparin dosing and monitoring.  Hgb 11.6, Plt slightly elevated 412  Goal of Therapy:  Heparin level 0.3-0.7 units/ml Monitor platelets by anticoagulation protocol: Yes   Plan:  Give 4000 units bolus x 1 Start heparin infusion at 1150 units/hr Check anti-Xa level in 6 hours and daily while on heparin Continue to monitor H&H and platelets  79, PharmD Pharmacy Resident  02/11/2020 3:28 PM

## 2020-02-11 NOTE — Procedures (Signed)
Endotracheal Intubation: Patient required placement of an artificial airway secondary to the patient was brought to the ICU from the progressive cardiac care and respiratory distress.  He had acute respiratory failure with hypoxia.  Underlying history of pulmonary fibrosis.  Consent: Emergent.  Did notify the patient's wife over the phone of the procedure.  Hand washing performed prior to starting the procedure.   Medications administered for sedation prior to procedure:  Midazolam 2 mg IV, rocuronium 40 mg IV, etomidate 20 mg IV, fentanyl  50 mcg IV.    A time out procedure was called and correct patient, name, & ID confirmed. Needed supplies and equipment were assembled and checked to include ETT, 10 ml syringe, Glidescope, Mac and Miller blades, suction, oxygen and bag mask valve, end tidal CO2 monitor.   Patient was positioned to align the mouth and pharynx to facilitate visualization of the glottis.   Heart rate, SpO2 and blood pressure was continuously monitored during the procedure. Pre-oxygenation was conducted prior to intubation oxygen saturations of more than 94% could not be achieved even with Peep valve.  The airway was then visualized with a Glidescope with #4 blade, and endotracheal tube was placed through the vocal cords into the trachea.  The airway was very anterior.   The artificial airway was placed under direct visualization via glidescope route using a #8 ETT on the first attempt.  ETT was secured at 24 cm mark at the teeth.  Placement was confirmed by auscuitation of lungs with good breath sounds bilaterally and no epigastric sounds.  Condensation was noted on endotracheal tube.   Pulse ox 98%.  CO2 detector in place with appropriate color change.   Complications: None   Operator: Onnie Boer: Endotracheal tube in good position.  No pneumothorax.  Comments: OGT placed by nursing  Patient tolerated the procedure well.  Family was notified after the procedure  of successful placement of the ET tube.  Renold Don, MD [facility]    *This note was dictated using voice recognition software/Dragon.  Despite best efforts to proofread, errors can occur which can change the meaning.  Any change was purely unintentional.  Check

## 2020-02-11 NOTE — Progress Notes (Signed)
PROGRESS NOTE    Donald Sellers   XBM:[id number]  DOB: 1951/03/13  PCP: Patient, No Pcp Per    DOA: 09-Feb-2020 LOS: 3   Brief Narrative   Donald Sellers is a 69 y.o. male with medical history significant for pulmonary fibrosis on 4 L/min of continuous home oxygen.  He presented to the ED on 09-Feb-2020 for evaluation of worsening SOB/DOE from his baseline with increased oxygen requirements and associated productive cough.  This in setting of recent diagnosis of pulmonary fibrosis in July after having progressive SOB for 1 year.  Evaluation in the ED showed leukocytosis with left shift, normal lactic acid, preserved renal function, spO2 in low 80's on baseline 4 L/min oxygen and requiring NRD mask.  CTA chest negative for acute PE.  Showed: - Progression of extensive bilateral interstitial and consolidative opacities, even compared to recent chest CT performed 12/11/2019, worrisome for progressive fibrotic lung disease, nonspecific though suggestive of UIP given probable honeycombing within the right lower lobe.   - Improved aeration of previously noted consolidative opacities within the left upper lobe suggestive of resolved infection and/or inflammation.  - Redemonstrated mediastinal and bilateral hilar lymphadenopathy, nonspecific though presumably reactive in etiology.  Treated in the ED with IV steroids, Vancomycin and Cefepime.  Admitted to hospitalist service for further evaluation and mangagement.  Pulmonology consulted.     Assessment & Plan   Principal Problem:   Acute on chronic respiratory failure with hypoxia (HCC) Active Problems:   CAP (community acquired pneumonia)   Sepsis (HCC)   Pulmonary fibrosis (HCC)   Atrial fibrillation with RVR (HCC)   Gastroesophageal reflux disease   Acute on chronic respiratory failure with hypoxia - baseline 4-5 L/min oxygen requirement.  Chronic hypoxia due to pulmonary fibrosis, acutely worse due to CAP.  Requiring 100% FiO2 on  heated HFNC at 50 L/min. --Supplemental O2 as needed, maintain O2 sat > 90% --BiPAP as needed --monitor volume status, consider intermittent Lasix as needed --Ativan q4h PRN for air hunger  Severe sepsis secondary to Community acquired pneumonia - sepsis present on admission as evidenced by tachycardia, tachypnea leukocytosis with left shift, imaging findings of worsening opacities consistent with pneumonia.  Organ dysfunction as evidence by acute on chronic hypoxia.   --Continue Zosyn, IV steroids per pulm --Add doxycycline for atypical coverage --follow cultures --Pulmonology following  Acute exacerbation of Pulmonary fibrosis - currently pneumonia complicating underlying PF.  Pulmonology following.   --Continue steroids and oxygen as above  Atrial fibrillation with RVR - present on admission, likely due to underlying respiratory illness.  Cardiology is following.  Continue digoxin, Cardizem infusion.  Telemetry.  GERD - chronic, stable.  On IV PPI with high oxygen requirement.     DVT prophylaxis: enoxaparin (LOVENOX) injection 40 mg Start: 09-Feb-2020 2200   Diet:  Diet Orders (From admission, onward)    Start     Ordered   02/09/2020 1131  Diet 2 gram sodium Room service appropriate? Yes; Fluid consistency: Thin  Diet effective now       Question Answer Comment  Room service appropriate? Yes   Fluid consistency: Thin      09-Feb-2020 1132            Code Status: Full Code    Subjective 02/11/20    Patient seen this AM.  Earlier this AM, nursing report patient had pulled off BiPAP and was found with O2 sat in 40's and cyanotic.  Placed back on BiPAP with recovery to 88%.  When seen at bedside, patient reports being hungry (currently desats too quickly to come off BiPAP to eat).  Has very dry mouth.     Disposition Plan & Communication   Status is: Inpatient  Remains inpatient appropriate because:Inpatient level of care appropriate due to severity of illness with high  oxygen requirement, on IV steroids, IV antibiotics.   Dispo: The patient is from: Home              Anticipated d/c is to: Home              Anticipated d/c date is: > 3 days              Patient currently is not medically stable to d/c.   Family Communication: none at bedside will attempt to call    Consults, Procedures, Significant Events   Consultants:   Pulmonology  Cardiology   Procedures:   None  Antimicrobials:  Anti-infectives (From admission, onward)   Start     Dose/Rate Route Frequency Ordered Stop   02/09/20 1200  piperacillin-tazobactam (ZOSYN) IVPB 3.375 g        3.375 g 12.5 mL/hr over 240 Minutes Intravenous Every 8 hours 02/09/20 0818     02/09/20 1000  azithromycin (ZITHROMAX) 500 mg in sodium chloride 0.9 % 250 mL IVPB  Status:  Discontinued        500 mg 250 mL/hr over 60 Minutes Intravenous Every 24 hours 04-Oct-2019 1132 04-Oct-2019 1330   04-Oct-2019 1800  cefTRIAXone (ROCEPHIN) 2 g in sodium chloride 0.9 % 100 mL IVPB  Status:  Discontinued        2 g 200 mL/hr over 30 Minutes Intravenous Every 24 hours 04-Oct-2019 1132 04-Oct-2019 1330   04-Oct-2019 1400  levofloxacin (LEVAQUIN) IVPB 750 mg  Status:  Discontinued        750 mg 100 mL/hr over 90 Minutes Intravenous Every 24 hours 04-Oct-2019 1330 02/09/20 0814   04-Oct-2019 0815  vancomycin (VANCOCIN) IVPB 1000 mg/200 mL premix        1,000 mg 200 mL/hr over 60 Minutes Intravenous  Once 04-Oct-2019 0811 04-Oct-2019 1029   04-Oct-2019 0815  ceFEPIme (MAXIPIME) 2 g in sodium chloride 0.9 % 100 mL IVPB        2 g 200 mL/hr over 30 Minutes Intravenous  Once 04-Oct-2019 0811 04-Oct-2019 0857   04-Oct-2019 0815  azithromycin (ZITHROMAX) 500 mg in sodium chloride 0.9 % 250 mL IVPB        500 mg 250 mL/hr over 60 Minutes Intravenous  Once 04-Oct-2019 0811 04-Oct-2019 1030        Objective   Vitals:   02/10/20 1640 02/10/20 1648 02/10/20 1934 02/10/20 2356  BP:  (!) 97/59 106/61 130/75  Pulse:  (!) 116 98 93  Resp:  (!) 21    Temp:  97.7  F (36.5 C) 98 F (36.7 C) 98.8 F (37.1 C)  TempSrc:  Axillary Oral Oral  SpO2: 90% 90% 90% 95%  Weight:      Height:        Intake/Output Summary (Last 24 hours) at 02/11/2020 0730 Last data filed at 02/11/2020 0542 Gross per 24 hour  Intake 751.95 ml  Output 600 ml  Net 151.95 ml   Filed Weights   04-Oct-2019 0743 02/09/20 0000 02/10/20 0357  Weight: 77.9 kg 86.2 kg 80.5 kg    Physical Exam:  General exam: awake, alert, no acute distress Respiratory system: tachypnic, shallow inspirations, rhonchi R base, on BiPAP Cardiovascular  system: normal S1/S2, irregularly irregular, no pedal edema.   Gastrointestinal system: soft, NT, ND Central nervous system: A&O x3. no gross focal neurologic deficits, normal speech Extremities: moves all, no edema, normal tone Psychiatry: normal mood, congruent affect  Labs   Data Reviewed: I have personally reviewed following labs and imaging studies  CBC: Recent Labs  Lab 02/10/2020 0754 02/09/20 0714 02/10/20 0432 02/11/20 0335  WBC 18.0* 18.9* 27.6* 25.0*  NEUTROABS 13.8*  --   --   --   HGB 13.4 11.9* 11.6* 11.6*  HCT 39.8 36.8* 37.0* 36.7*  MCV 87.1 90.6 93.2 91.8  PLT 432* 378 416* 412*   Basic Metabolic Panel: Recent Labs  Lab 02/14/2020 0754 02/09/20 0714 02/09/20 1039 02/10/20 0432 02/11/20 0335  NA 133* 140  --  142 140  K 3.8 4.5  --  3.9 4.2  CL 95* 102  --  103 99  CO2 26 28  --  29 32  GLUCOSE 132* 160*  --  174* 151*  BUN 8 16  --  23 22  CREATININE 0.63 0.61  --  0.66 0.55*  CALCIUM 8.9 9.1  --  9.0 8.8*  MG  --   --  2.2  --   --    GFR: Estimated Creatinine Clearance: 92.5 mL/min (A) (by C-G formula based on SCr of 0.55 mg/dL (L)). Liver Function Tests: Recent Labs  Lab 01/29/2020 0754  AST 31  ALT 28  ALKPHOS 112  BILITOT 1.1  PROT 7.9  ALBUMIN 3.3*   No results for input(s): LIPASE, AMYLASE in the last 168 hours. No results for input(s): AMMONIA in the last 168 hours. Coagulation Profile: Recent  Labs  Lab 02/02/2020 0754  INR 1.2   Cardiac Enzymes: No results for input(s): CKTOTAL, CKMB, CKMBINDEX, TROPONINI in the last 168 hours. BNP (last 3 results) No results for input(s): PROBNP in the last 8760 hours. HbA1C: No results for input(s): HGBA1C in the last 72 hours. CBG: Recent Labs  Lab 02/09/20 0121  GLUCAP 148*   Lipid Profile: No results for input(s): CHOL, HDL, LDLCALC, TRIG, CHOLHDL, LDLDIRECT in the last 72 hours. Thyroid Function Tests: Recent Labs    02/09/20 0714  TSH 1.610   Anemia Panel: No results for input(s): VITAMINB12, FOLATE, FERRITIN, TIBC, IRON, RETICCTPCT in the last 72 hours. Sepsis Labs: Recent Labs  Lab 02/04/2020 0754  LATICACIDVEN 1.5    Recent Results (from the past 240 hour(s))  Blood Culture (routine x 2)     Status: None (Preliminary result)   Collection Time: 01/28/2020  7:54 AM   Specimen: BLOOD  Result Value Ref Range Status   Specimen Description BLOOD BLOOD LEFT ARM  Final   Special Requests   Final    BOTTLES DRAWN AEROBIC AND ANAEROBIC Blood Culture adequate volume   Culture   Final    NO GROWTH 3 DAYS Performed at [facility], [address] [place], [place] [address]    Report Status PENDING  Incomplete  Blood Culture (routine x 2)     Status: None (Preliminary result)   Collection Time: 01/24/2020  8:01 AM   Specimen: BLOOD  Result Value Ref Range Status   Specimen Description BLOOD BLOOD LEFT HAND  Final   Special Requests   Final    BOTTLES DRAWN AEROBIC AND ANAEROBIC Blood Culture results may not be optimal due to an excessive volume of blood received in culture bottles   Culture   Final    NO  GROWTH 3 DAYS Performed at [facility], [address] [place], [place] [address]    Report Status PENDING  Incomplete  SARS Coronavirus 2 by RT PCR (hospital order, performed in [facility] hospital lab) Nasopharyngeal Nasopharyngeal Swab     Status: None   Collection Time: 02/26/20  8:01 AM    Specimen: Nasopharyngeal Swab  Result Value Ref Range Status   SARS Coronavirus 2 NEGATIVE NEGATIVE Final    Comment: (NOTE) SARS-CoV-2 target nucleic acids are NOT DETECTED.  The SARS-CoV-2 RNA is generally detectable in upper and lower respiratory specimens during the acute phase of infection. The lowest concentration of SARS-CoV-2 viral copies this assay can detect is 250 copies / mL. A negative result does not preclude SARS-CoV-2 infection and should not be used as the sole basis for treatment or other patient management decisions.  A negative result may occur with improper specimen collection / handling, submission of specimen other than nasopharyngeal swab, presence of viral mutation(s) within the areas targeted by this assay, and inadequate number of viral copies (<250 copies / mL). A negative result must be combined with clinical observations, patient history, and epidemiological information.  Fact Sheet for Patients:   BoilerBrush.com.cy  Fact Sheet for Healthcare Providers: https://pope.com/  This test is not yet approved or  cleared by the [place] FDA and has been authorized for detection and/or diagnosis of SARS-CoV-2 by FDA under an Emergency Use Authorization (EUA).  This EUA will remain in effect (meaning this test can be used) for the duration of the COVID-19 declaration under Section 564(b)(1) of the Act, 21 U.S.C. section 360bbb-3(b)(1), unless the authorization is terminated or revoked sooner.  Performed at [facility], [address] [place], [place] [address]   Respiratory Panel by PCR     Status: None   Collection Time: 2020-02-26 11:39 PM   Specimen: Nasopharyngeal Swab; Respiratory  Result Value Ref Range Status   Adenovirus NOT DETECTED NOT DETECTED Final   Coronavirus 229E NOT DETECTED NOT DETECTED Final    Comment: (NOTE) The Coronavirus on the Respiratory Panel, DOES NOT test for the  novel  Coronavirus (2019 nCoV)    Coronavirus HKU1 NOT DETECTED NOT DETECTED Final   Coronavirus NL63 NOT DETECTED NOT DETECTED Final   Coronavirus OC43 NOT DETECTED NOT DETECTED Final   Metapneumovirus NOT DETECTED NOT DETECTED Final   Rhinovirus / Enterovirus NOT DETECTED NOT DETECTED Final   Influenza A NOT DETECTED NOT DETECTED Final   Influenza B NOT DETECTED NOT DETECTED Final   Parainfluenza Virus 1 NOT DETECTED NOT DETECTED Final   Parainfluenza Virus 2 NOT DETECTED NOT DETECTED Final   Parainfluenza Virus 3 NOT DETECTED NOT DETECTED Final   Parainfluenza Virus 4 NOT DETECTED NOT DETECTED Final   Respiratory Syncytial Virus NOT DETECTED NOT DETECTED Final   Bordetella pertussis NOT DETECTED NOT DETECTED Final   Chlamydophila pneumoniae NOT DETECTED NOT DETECTED Final   Mycoplasma pneumoniae NOT DETECTED NOT DETECTED Final    Comment: Performed at [facility], [address] [place], [place] [address]  MRSA PCR Screening     Status: None   Collection Time: 26-Feb-2020 11:39 PM   Specimen: Nasal Mucosa; Nasopharyngeal  Result Value Ref Range Status   MRSA by PCR NEGATIVE NEGATIVE Final    Comment:        The GeneXpert MRSA Assay (FDA approved for NASAL specimens only), is one component of a comprehensive MRSA colonization surveillance program. It is not intended to  diagnose MRSA infection nor to guide or monitor treatment for MRSA infections. Performed at [facility], [address], [place], [place] [address]   Urine culture     Status: None   Collection Time: 02/09/20  2:08 AM   Specimen: In/Out Cath Urine  Result Value Ref Range Status   Specimen Description   Final    IN/OUT CATH URINE Performed at [facility], [address], [place], [place] [address]    Special Requests   Final    NONE Performed at [facility], [address], [place], [place] [address]    Culture   Final    NO GROWTH Performed at [facility], [address] [place], [place] [address]    Report Status 02/10/2020 FINAL  Final  Sputum culture     Status: None   Collection Time: 02/09/20  7:14 AM   Specimen: Expectorated Sputum  Result Value Ref Range Status   Specimen Description EXPECTORATED SPUTUM  Final   Special Requests NONE  Final   Sputum evaluation   Final    THIS SPECIMEN IS ACCEPTABLE FOR SPUTUM CULTURE Performed at [facility], [address], [place], [place] [address]    Report Status 02/09/2020 FINAL  Final  Culture, respiratory     Status: None (Preliminary result)   Collection Time: 02/09/20  7:14 AM  Result Value Ref Range Status   Specimen Description   Final    EXPECTORATED SPUTUM Performed at [facility], [address], [place], [place] [address]    Special Requests   Final    NONE Reflexed from [phone] Performed at [facility] [address] [place], [place] [address]    Gram Stain   Final    RARE WBC PRESENT, PREDOMINANTLY PMN ABUNDANT GRAM VARIABLE ROD FEW GRAM POSITIVE COCCI    Culture   Final    NO GROWTH < 24 HOURS Performed at [facility], [address], [place], [place] [address]    Report Status PENDING  Incomplete      Imaging Studies   DG Chest 1 View  Result Date: 02/09/2020 CLINICAL DATA:  Shortness of breath.  Cough. EXAM: CHEST  1 VIEW COMPARISON:  Chest x-ray 02/03/2020, 12/11/2019 CT 02/10/2020. FINDINGS: Mediastinum and hilar structures appear stable. Heart size appears stable. Diffuse severe interstitial changes are again noted again. Chronic interstitial lung disease again could present this fashion. Superimposed acute process including pneumonitis cannot be excluded. No pleural effusion or pneumothorax. IMPRESSION: Diffuse severe interstitial changes are again noted. Chronic interstitial lung disease again could present this fashion. Superimposed acute process including pneumonitis cannot be excluded. No significant interim change  from prior studies. Electronically Signed   By: Maisie Fus  Register   On: 02/09/2020 08:25     Medications   Scheduled Meds: . ALPRAZolam  1 mg Oral QHS  . digoxin  0.25 mg Oral BID  . enoxaparin (LOVENOX) injection  40 mg Subcutaneous Q24H  . methylPREDNISolone (SOLU-MEDROL) injection  60 mg Intravenous TID  . montelukast  10 mg Oral QHS  . pantoprazole (PROTONIX) IV  40 mg Intravenous Q24H   Continuous Infusions: . sodium chloride    . diltiazem (CARDIZEM) infusion 10 mg/hr (02/10/20 2206)  . piperacillin-tazobactam (ZOSYN)  IV 3.375 g (02/11/20 0521)       LOS: 3 days    Time spent: 30 minutes    Pennie Banter, DO [facility]  02/11/2020, 7:30 AM  If 7PM-7AM, please contact night-coverage. How to contact the [facility] Attending or Consulting provider [place] or covering provider during after hours [place], for this patient?    1. Check the care team in [facility] and look for a) attending/consulting [facility] provider listed and b) the [facility] team listed 2. Log into [email or web address] and use [place]'s universal password to access. If you do not have the password, please contact the hospital operator. 3. Locate the [facility] provider you are looking for under [facility] and page to a number that you can be directly reached. 4. If you still have difficulty reaching the provider, please page the [facility] (Director on Call) for the Hospitalists listed on amion for assistance.

## 2020-02-11 NOTE — Consult Note (Signed)
Name: Donald Sellers MRN: [id number] DOB: 03-03-1951    ADMISSION DATE:  02/17/2020 CONSULTATION DATE:  02/11/2020  REFERRING MD :  Dr. Arbutus Ped  CHIEF COMPLAINT:  Acute Respiratory Distress  BRIEF PATIENT DESCRIPTION:  69 y.o. Male admitted with Acute on Chronic Hypoxic Respiratory Failure in the setting of Community Acquired Pneumonia and Acute Exacerbation of Idiopathic Pulmonary Fibrosis.  Required emergent intubation on 02/11/20.  SIGNIFICANT EVENTS  02/09/20- Patient in [facility] during evaluation this am.  He was placed on BIPAP and is on cardizem gtt.  He may need closer monitoring since he is now on drip and NIV.  Will attempt to wean off both today and if unable to will advance care to SDU.  02/10/20- patient is improved, he has + bacterial respiratory culture with strep species.   His AFRVR is also improved and he has been seen by cariology.  He shares dyspnea and SOB is somewhat improved and he is off BIPAP during my evaluation.  02/11/20: Acute Respiratory Distress requiring emergent intubation  STUDIES:  9/20: CXR>>Widespread fibrosis, essentially stable. It is difficult to exclude a degree of underlying emphysematous change. The appearance is essentially stable compared to prior study. No consolidation evident. Heart upper normal in size. 9/20: CTA Chest>>1. No evidence of pulmonary embolism. 2. Progression of extensive bilateral interstitial and consolidative opacities, even compared to recent chest CT performed 12/11/2019, worrisome for progressive fibrotic lung disease, nonspecific though suggestive of UIP given probable honeycombing within the right lower lobe. 3. Improved aeration of previously noted consolidative opacities within the left upper lobe suggestive of resolved infection and/or inflammation. 4. Redemonstrated mediastinal and bilateral hilar lymphadenopathy, nonspecific though presumably reactive in etiology.  CULTURES: SARS-CoV-2 PCR 9/20>>  negative Respiratory viral panel 9/20>> negative Blood culture x2 9/20>> no growth to date Urine 9/21>> no growth Sputum culture 9/21>> normal respiratory flora (no staph aureus or Pseudomonas) Sputum culture 9/23>>  ANTIBIOTICS: Zosyn  Doxycycline  HISTORY OF PRESENT ILLNESS:   Donald Sellers is a 69 year old male with a past medical history significant for pulmonary fibrosis on 4 L/min home O2 who presented to [facility] on 02/13/2020 due to worsening shortness of breath from his baseline with increased O2 requirements and associated productive cough.  Of note he was recently diagnosed with pulmonary fibrosis in July 20 1:21 year of progressive shortness of breath.  Upon presentation to the ED he was noted to be hypoxic with O2 sats in the low 80s on his home 4 L of O2, which he was placed on nonrebreather mask.  Initial work-up in the ED showed leukocytosis with left shift, normal lactic acid.  CTA chest was negative for PE, did show progression of extensive bilateral interstitial and consolidative opacities which were worrisome for progressive fibrotic lung disease and questionable pneumonia.    He was admitted by the hospitalist for further work-up and treatment of acute on chronic hypoxic respiratory failure in the setting of community-acquired pneumonia and acute exacerbation of pulmonary fibrosis.  Pulmonary was consulted.  On 02/11/2020 he developed acute respiratory distress requiring transfer to ICU and subsequent intubation.  PCCM is consulted for further management.  PAST MEDICAL HISTORY :   has a past medical history of Patient denies medical problems and Pulmonary fibrosis ([place]).  has a past surgical history that includes Eye surgery and ORIF ankle fracture (Left, 12/28/2019). Prior to Admission medications   Medication Sig Start Date End Date Taking? Authorizing Provider  montelukast (SINGULAIR) 10 MG tablet Take 10 mg by mouth  at bedtime. 01/16/20  Yes [provider]   pantoprazole (PROTONIX) 40 MG tablet Take 1 tablet (40 mg total) by mouth 2 (two) times daily. 12/17/19 03/16/20 Yes Enzo Bi, MD   No Known Allergies  FAMILY HISTORY:  family history is not on file. SOCIAL HISTORY:  reports that he quit smoking about 41 years ago. His smoking use included cigarettes. He has a 11.00 pack-year smoking history. He has never used smokeless tobacco. He reports current alcohol use. He reports that he does not use drugs.   COVID-19 DISASTER DECLARATION:  FULL CONTACT PHYSICAL EXAMINATION WAS NOT POSSIBLE DUE TO TREATMENT OF COVID-19 AND  CONSERVATION OF PERSONAL PROTECTIVE EQUIPMENT, LIMITED EXAM FINDINGS INCLUDE-  Patient assessed or the symptoms described in the history of present illness.  In the context of the Global COVID-19 pandemic, which necessitated consideration that the patient might be at risk for infection with the SARS-CoV-2 virus that causes COVID-19, Institutional protocols and algorithms that pertain to the evaluation of patients at risk for COVID-19 are in a state of rapid change based on information released by regulatory bodies including the CDC and federal and state organizations. These policies and algorithms were followed during the patient's care while in hospital.  REVIEW OF SYSTEMS:   Unable to assess due to critical illness, intubation and sedation  SUBJECTIVE:  Unable to assess due to critical illness, intubation and sedation  VITAL SIGNS: Temp:  [97.6 F (36.4 C)-98.8 F (37.1 C)] 97.7 F (36.5 C) (09/23 1825) Pulse Rate:  [84-135] 121 (09/23 1845) Resp:  [12-46] 29 (09/23 1845) BP: (82-173)/(45-105) 173/80 (09/23 1845) SpO2:  [84 %-100 %] 99 % (09/23 1845) FiO2 (%):  [100 %] 100 % (09/23 1842)  PHYSICAL EXAMINATION: General:  Critically ill appearing male, laying in bed, intubated and sedated, NAD following versed administration Neuro:  Heavily sedated, pupils PERRLA HEENT:  Atraumatic, normocephalic, neck supple, no  JVD Cardiovascular:  Tachycardia, irregularly irregular rhythm, no M/R/G, 1+ distal pulses Lungs:  Coarse breath sounds to auscultation bilaterally, vent assisted, even Abdomen:  Soft, nontender, nondistended, no guarding or rebound tenderness, BS+ x4 Musculoskeletal:  Normal bulk and tone, no deformities, no edema Skin:  Warm and dry. No obvious rashes, lesions, or ulcerations  Recent Labs  Lab 02/10/20 0432 02/11/20 0335 02/11/20 1908  NA 142 140 143  K 3.9 4.2 4.4  CL 103 99 99  CO2 29 32 33*  BUN _0 CREATININE 0.66 0.55* 0.52*  GLUCOSE 174* 151* 191*   Recent Labs  Lab 02/10/20 0432 02/11/20 0335 02/11/20 1908  HGB 11.6* 11.6* 12.6*  HCT 37.0* 36.7* 40.8  WBC 27.6* 25.0* 28.0*  PLT 416* 412* 487*   No results found.  ASSESSMENT / PLAN:  Acute on Chronic Hypoxic Respiratory Failure secondary to Acute Exacerbation of Idiopathic Pulmonary Fibrosis & Community Acquired Pneumonia Hx: Baseline 4-5 L O2 requirement -Full Vent support -Wean FiO2 & PEEP as tolerated to maintain O2 sats >92% -Follow intermittent CXR & ABG as needed -VAP Protocol -Spontaneous breathing trials when respiratory parameters met -Continue Steroids -Continue Doxycycline & Zosyn -Prn Bronchodilators   Severe Sepsis secondary to Community Acquired Pneumonia -Monitor fever curve -Trend WBC's & Procalcitonin -Follow cultures as above -Continue Zosyn & Doxycycline   Atrial Fibrillation w/ RVR -Continuous cardiac monitoring -Maintain MAP >65 -Cardiology following, appreciate input ~ follow up recommendations -Continue Cardizem & Heparin gtts   Hyperglycemia -CBG's -SSI -Follow ICU Hypo/Hyperglycemia protocol   GERD -NPO -IV Protonix BID  Sedation needs in setting of mechanical ventilation -Maintain RASS goal 0 to -1 -Fentanyl and Propofol gtts as needed to maintain RASS goal -Avoid sedating meds as able -Daily wake up assessment -Provide supportive care       Pt  is critically ill, prognosis is guarded.  He is at high risk for cardiac arrest and death.   BEST PRACTICES: DISPOSITION: ICU GOALS OF CARE: Full Code VTE PROPHYLAXIS/ANTICOAGULATION: Heparin gtt SUP: IV Protonix CONSULS: Cardiology UPDATES: Pt's wife updated by Dr. Patsey Berthold 02/11/20  Darel Hong, AGACNP-BC [facility] Pager: [phone]  02/11/2020, 7:38 PM

## 2020-02-11 NOTE — Progress Notes (Addendum)
Dr. Denton Lank at bedside, patient still on continuous BiPAP and Cardizem gtt at 43ml/hr. Patient still anxious and wants to come off BiPAP to eat breakfast. Explained to patient that we can not take him off the BiPAP at this time because his oxygen saturation drops. Verbal orders to change PRN Ativan to Q4hours. Patient unable to take PO meds at this time.

## 2020-02-11 NOTE — Progress Notes (Signed)
SUBJECTIVE: Patient is feeling much better   Vitals:   02/10/20 2356 02/11/20 0800 02/11/20 0824 02/11/20 0852  BP: 130/75 118/72 134/88   Pulse: 93 (!) 113 (!) 126 (!) 105  Resp:  (!) 46 (!) 40 (!) 37  Temp: 98.8 F (37.1 C)  97.6 F (36.4 C)   TempSrc: Oral     SpO2: 95% (!) 84% (!) 88% 93%  Weight:      Height:        Intake/Output Summary (Last 24 hours) at 02/11/2020 0858 Last data filed at 02/11/2020 0815 Gross per 24 hour  Intake 751.95 ml  Output 900 ml  Net -148.05 ml    LABS: Basic Metabolic Panel: Recent Labs    02/09/20 0714 02/09/20 1039 02/10/20 0432 02/11/20 0335  NA   < >  --  142 140  K   < >  --  3.9 4.2  CL   < >  --  103 99  CO2   < >  --  29 32  GLUCOSE   < >  --  174* 151*  BUN   < >  --  23 22  CREATININE   < >  --  0.66 0.55*  CALCIUM   < >  --  9.0 8.8*  MG  --  2.2  --   --    < > = values in this interval not displayed.   Liver Function Tests: No results for input(s): AST, ALT, ALKPHOS, BILITOT, PROT, ALBUMIN in the last 72 hours. No results for input(s): LIPASE, AMYLASE in the last 72 hours. CBC: Recent Labs    02/10/20 0432 02/11/20 0335  WBC 27.6* 25.0*  HGB 11.6* 11.6*  HCT 37.0* 36.7*  MCV 93.2 91.8  PLT 416* 412*   Cardiac Enzymes: No results for input(s): CKTOTAL, CKMB, CKMBINDEX, TROPONINI in the last 72 hours. BNP: Invalid input(s): POCBNP D-Dimer: No results for input(s): DDIMER in the last 72 hours. Hemoglobin A1C: No results for input(s): HGBA1C in the last 72 hours. Fasting Lipid Panel: No results for input(s): CHOL, HDL, LDLCALC, TRIG, CHOLHDL, LDLDIRECT in the last 72 hours. Thyroid Function Tests: Recent Labs    02/09/20 0714  TSH 1.610   Anemia Panel: No results for input(s): VITAMINB12, FOLATE, FERRITIN, TIBC, IRON, RETICCTPCT in the last 72 hours.   PHYSICAL EXAM General: Well developed, well nourished, in no acute distress HEENT:  Normocephalic and atramatic Neck:  No JVD.  Lungs: Clear  bilaterally to auscultation and percussion. Heart: HRRR . Normal S1 and S2 without gallops or murmurs.  Abdomen: Bowel sounds are positive, abdomen soft and non-tender  Msk:  Back normal, normal gait. Normal strength and tone for age. Extremities: No clubbing, cyanosis or edema.   Neuro: Alert and oriented X 3. Psych:  Good affect, responds appropriately  TELEMETRY: A. fib with ventricular rate 110  ASSESSMENT AND PLAN: Atrial fibrillation with ventricular rate have been between 100-1 10.  Advised changing digoxin dose to 0.25 once a day.  Principal Problem:   Acute on chronic respiratory failure with hypoxia (HCC) Active Problems:   Gastroesophageal reflux disease   CAP (community acquired pneumonia)   Sepsis (HCC)   Pulmonary fibrosis (HCC)   Atrial fibrillation with RVR (HCC)    Adrian Blackwater A, MD, [facility] 02/11/2020 8:58 AM

## 2020-02-11 NOTE — Progress Notes (Addendum)
Patient still requiring continuous BiPAP,on Cardizem gtt and now Heparin gtt. Patient's heart rate has improved now in the low 100s but his oxygen status has stayed the same. We have not been able to wean him off the BiPAP, requiring closer monitoring so orders were put in to transfer to ICU 19. Wife Shooter Tangen updated on patient' current status and informed patient being transferred to ICU.  Update 1800: patient transferred to  ICU19. Bedside report given to [facility].

## 2020-02-11 NOTE — Progress Notes (Signed)
Patient removed BiPAP this am and was sitting on the edge of the bed looking for his urinal. Upon assessment oxygen was in the 40s and hear rate in the 140s. This RN and nursing student helped patient lay back down, BiPAP was reapplied and oxygen came back up to 88-91. Patient very anxious at the time with heart rate still in the 120s-130s. After a couple of min after laying in bed and patient more calm heart rate was still in the 120s. Cardizem gtt increased to 37ml/hr. Dr.Griffith made aware.

## 2020-02-11 NOTE — Progress Notes (Signed)
Shift summary:  - Patient transferred from 2A in resp distress, breathin 60-70 bpm. - Now intubated and sedated - ETT, OGT, and Foley placed.

## 2020-02-12 ENCOUNTER — Inpatient Hospital Stay: Payer: BC Managed Care – PPO

## 2020-02-12 ENCOUNTER — Inpatient Hospital Stay: Payer: Self-pay

## 2020-02-12 LAB — PROCALCITONIN
Procalcitonin: <0.1 ng/mL
Procalcitonin: <0.1 ng/mL

## 2020-02-12 LAB — BASIC METABOLIC PANEL
Anion gap: 8 (ref 5–15)
BUN: 23 mg/dL (ref 8–23)
CO2: 35 mmol/L — ABNORMAL HIGH (ref 22–32)
Calcium: 8.9 mg/dL (ref 8.9–10.3)
Chloride: 103 mmol/L (ref 98–111)
Creatinine, Ser: 0.55 mg/dL — ABNORMAL LOW (ref 0.61–1.24)
GFR calc Af Amer: >60 mL/min (ref >60)
GFR calc non Af Amer: >60 mL/min (ref >60)
Glucose, Bld: 199 mg/dL — ABNORMAL HIGH (ref 70–99)
Potassium: 4.5 mmol/L (ref 3.5–5.1)
Sodium: 146 mmol/L — ABNORMAL HIGH (ref 135–145)

## 2020-02-12 LAB — BLOOD GAS, ARTERIAL
Acid-Base Excess: 12.4 mmol/L — ABNORMAL HIGH (ref 0.0–2.0)
Bicarbonate: 38.9 mmol/L — ABNORMAL HIGH (ref 20.0–28.0)
FIO2: 0.9
O2 Saturation: 99.5 %
Patient temperature: 37
Pressure control: 27 cmH2O
RATE: 34 resp/min
pCO2 arterial: 60 mmHg — ABNORMAL HIGH (ref 32.0–48.0)
pH, Arterial: 7.42 (ref 7.350–7.450)
pO2, Arterial: 166 mmHg — ABNORMAL HIGH (ref 83.0–108.0)

## 2020-02-12 LAB — STREP PNEUMONIAE URINARY ANTIGEN: Strep Pneumo Urinary Antigen: NEGATIVE

## 2020-02-12 LAB — HEPARIN LEVEL (UNFRACTIONATED)
Heparin Unfractionated: 0.3 IU/mL (ref 0.30–0.70)
Heparin Unfractionated: 0.39 IU/mL (ref 0.30–0.70)

## 2020-02-12 LAB — CBC
HCT: 32.5 % — ABNORMAL LOW (ref 39.0–52.0)
Hemoglobin: 10.2 g/dL — ABNORMAL LOW (ref 13.0–17.0)
MCH: 29.7 pg (ref 26.0–34.0)
MCHC: 31.4 g/dL (ref 30.0–36.0)
MCV: 94.5 fL (ref 80.0–100.0)
Platelets: 352 10*3/uL (ref 150–400)
RBC: 3.44 MIL/uL — ABNORMAL LOW (ref 4.22–5.81)
RDW: 13.3 % (ref 11.5–15.5)
WBC: 17.5 10*3/uL — ABNORMAL HIGH (ref 4.0–10.5)
nRBC: 0 % (ref 0.0–0.2)

## 2020-02-12 LAB — GLUCOSE, CAPILLARY
Glucose-Capillary: 135 mg/dL — ABNORMAL HIGH (ref 70–99)
Glucose-Capillary: 139 mg/dL — ABNORMAL HIGH (ref 70–99)
Glucose-Capillary: 144 mg/dL — ABNORMAL HIGH (ref 70–99)
Glucose-Capillary: 153 mg/dL — ABNORMAL HIGH (ref 70–99)
Glucose-Capillary: 164 mg/dL — ABNORMAL HIGH (ref 70–99)
Glucose-Capillary: 171 mg/dL — ABNORMAL HIGH (ref 70–99)

## 2020-02-12 LAB — HEMOGLOBIN A1C
Hgb A1c MFr Bld: 5.9 % — ABNORMAL HIGH (ref 4.8–5.6)
Mean Plasma Glucose: 122.63 mg/dL

## 2020-02-12 LAB — MAGNESIUM: Magnesium: 2.5 mg/dL — ABNORMAL HIGH (ref 1.7–2.4)

## 2020-02-12 LAB — FUNGITELL, SERUM

## 2020-02-12 LAB — DIGOXIN LEVEL: Digoxin Level: 1.5 ng/mL (ref 1.0–2.0)

## 2020-02-12 MED ORDER — PROSOURCE TF PO LIQD
45.0000 mL | Freq: Three times a day (TID) | ORAL | Status: DC
  Administered 2020-02-12 – 2020-02-18 (×18): 45 mL
  Filled 2020-02-12 (×19): qty 45

## 2020-02-12 MED ORDER — SODIUM CHLORIDE 0.9% FLUSH
10.0000 mL | Freq: Two times a day (BID) | INTRAVENOUS | Status: DC
  Administered 2020-02-13: 30 mL
  Administered 2020-02-13 – 2020-02-14 (×3): 10 mL
  Administered 2020-02-15: 30 mL
  Administered 2020-02-15 – 2020-02-21 (×12): 10 mL
  Administered 2020-02-21: 30 mL
  Administered 2020-02-22: 10 mL

## 2020-02-12 MED ORDER — VITAL AF 1.2 CAL PO LIQD
1000.0000 mL | ORAL | Status: DC
  Administered 2020-02-12 – 2020-02-17 (×4): 1000 mL

## 2020-02-12 MED ORDER — SODIUM CHLORIDE 0.9% FLUSH: 10.0000 mL | INTRAVENOUS | Status: DC | PRN

## 2020-02-12 MED ORDER — INSULIN ASPART 100 UNIT/ML ~~LOC~~ SOLN
0.0000 [IU] | SUBCUTANEOUS | Status: DC
  Administered 2020-02-12: 3 [IU] via SUBCUTANEOUS
  Administered 2020-02-12 (×2): 2 [IU] via SUBCUTANEOUS
  Administered 2020-02-12: 3 [IU] via SUBCUTANEOUS
  Administered 2020-02-12: 2 [IU] via SUBCUTANEOUS
  Administered 2020-02-13 (×6): 3 [IU] via SUBCUTANEOUS
  Administered 2020-02-13: 2 [IU] via SUBCUTANEOUS
  Administered 2020-02-14: 3 [IU] via SUBCUTANEOUS
  Administered 2020-02-14: 2 [IU] via SUBCUTANEOUS
  Administered 2020-02-14 (×2): 3 [IU] via SUBCUTANEOUS
  Administered 2020-02-14: 2 [IU] via SUBCUTANEOUS
  Administered 2020-02-15 (×5): 3 [IU] via SUBCUTANEOUS
  Administered 2020-02-15: 2 [IU] via SUBCUTANEOUS
  Administered 2020-02-16 (×2): 3 [IU] via SUBCUTANEOUS
  Administered 2020-02-16: 2 [IU] via SUBCUTANEOUS
  Administered 2020-02-16 – 2020-02-17 (×2): 3 [IU] via SUBCUTANEOUS
  Administered 2020-02-17 – 2020-02-18 (×7): 2 [IU] via SUBCUTANEOUS
  Administered 2020-02-18 – 2020-02-19 (×4): 3 [IU] via SUBCUTANEOUS
  Administered 2020-02-19: 2 [IU] via SUBCUTANEOUS
  Administered 2020-02-19: 3 [IU] via SUBCUTANEOUS
  Administered 2020-02-19 – 2020-02-20 (×3): 2 [IU] via SUBCUTANEOUS
  Administered 2020-02-20 (×3): 3 [IU] via SUBCUTANEOUS
  Administered 2020-02-20: 2 [IU] via SUBCUTANEOUS
  Administered 2020-02-21 (×6): 3 [IU] via SUBCUTANEOUS
  Administered 2020-02-21 – 2020-02-22 (×2): 2 [IU] via SUBCUTANEOUS
  Administered 2020-02-22 (×2): 3 [IU] via SUBCUTANEOUS
  Administered 2020-02-22: 2 [IU] via SUBCUTANEOUS
  Filled 2020-02-12 (×58): qty 1

## 2020-02-12 NOTE — Progress Notes (Signed)
SUBJECTIVE: Patient is intubated now   Vitals:   02/12/20 0800 02/12/20 0900 02/12/20 0902 02/12/20 0905  BP: (!) 86/56 (!) 75/59  92/62  Pulse: 93 99 84 (!) 104  Resp: 18 (!) 34 (!) 34 (!) 21  Temp: (!) 97.1 F (36.2 C)     TempSrc: Axillary     SpO2: 99% 98% 90% 95%  Weight:      Height:        Intake/Output Summary (Last 24 hours) at 02/12/2020 0932 Last data filed at 02/12/2020 0900 Gross per 24 hour  Intake 1586.34 ml  Output 1045 ml  Net 541.34 ml    LABS: Basic Metabolic Panel: Recent Labs    02/11/20 0335 02/11/20 0956 02/11/20 1908 02/11/20 2352 02/12/20 0544  NA   < >  --  143  --  146*  K   < >  --  4.4  --  4.5  CL   < >  --  99  --  103  CO2   < >  --  33*  --  35*  GLUCOSE   < >  --  191*  --  199*  BUN   < >  --  21  --  23  CREATININE   < >  --  0.52*  --  0.55*  CALCIUM   < >  --  8.6*  --  8.9  MG  --  2.6*  --  2.5*  --   PHOS  --  3.5 3.6  --   --    < > = values in this interval not displayed.   Liver Function Tests: Recent Labs    02/11/20 1908  ALBUMIN 3.0*   No results for input(s): LIPASE, AMYLASE in the last 72 hours. CBC: Recent Labs    02/11/20 1908 02/12/20 0544  WBC 28.0* 17.5*  NEUTROABS 24.7*  --   HGB 12.6* 10.2*  HCT 40.8 32.5*  MCV 93.4 94.5  PLT 487* 352   Cardiac Enzymes: No results for input(s): CKTOTAL, CKMB, CKMBINDEX, TROPONINI in the last 72 hours. BNP: Invalid input(s): POCBNP D-Dimer: No results for input(s): DDIMER in the last 72 hours. Hemoglobin A1C: No results for input(s): HGBA1C in the last 72 hours. Fasting Lipid Panel: Recent Labs    02/11/20 1908  TRIG 110   Thyroid Function Tests: No results for input(s): TSH, T4TOTAL, T3FREE, THYROIDAB in the last 72 hours.  Invalid input(s): FREET3 Anemia Panel: No results for input(s): VITAMINB12, FOLATE, FERRITIN, TIBC, IRON, RETICCTPCT in the last 72 hours.   PHYSICAL EXAM General: Well developed, well nourished, in no acute distress HEENT:   Normocephalic and atramatic Neck:  No JVD.  Lungs: Clear bilaterally to auscultation and percussion. Heart: HRRR . Normal S1 and S2 without gallops or murmurs.  Abdomen: Bowel sounds are positive, abdomen soft and non-tender  Msk:  Back normal, normal gait. Normal strength and tone for age. Extremities: No clubbing, cyanosis or edema.   Neuro: Alert and oriented X 3. Psych:  Good affect, responds appropriately  TELEMETRY: Atrial fibrillation with ventricular rate 80-90  ASSESSMENT AND PLAN: Atrial fibrillation with rapid ventricular response rate and respiratory failure with pulmonary fibrosis.  Advise continuing digoxin instead of p.o. 0.25 IV once a day and get a bili level.  Principal Problem:   Acute on chronic respiratory failure with hypoxia (HCC) Active Problems:   Gastroesophageal reflux disease   CAP (community acquired pneumonia)   Sepsis (HCC)   Pulmonary  fibrosis (HCC)   Atrial fibrillation with RVR (HCC)    Adrian Blackwater A, MD, [facility] 02/12/2020 9:32 AM

## 2020-02-12 NOTE — Consult Note (Signed)
ANTICOAGULATION CONSULT NOTE - Initial Consult  Pharmacy Consult for Heparin infusion Indication: atrial fibrillation  No Known Allergies  Patient Measurements: Height: 5\' 10"  (177.8 cm) Weight:  (patient could not be weight at the time. nurse notified.) IBW/kg (Calculated) : 73 Heparin Dosing Weight: 80.5 kg  Vital Signs: Temp: 98 F (36.7 C) (09/24 0000) Temp Source: Axillary (09/24 0000) BP: 97/55 (09/24 0000) Pulse Rate: 82 (09/24 0000)  Labs: Recent Labs    02/09/20 0714 02/09/20 0714 02/09/20 1039 02/10/20 0432 02/10/20 0432 02/11/20 0335 02/11/20 1908 02/11/20 2352  HGB 11.9*   < >  --  11.6*   < > 11.6* 12.6*  --   HCT 36.8*   < >  --  37.0*  --  36.7* 40.8  --   PLT 378   < >  --  416*  --  412* 487*  --   HEPARINUNFRC  --   --   --   --   --   --   --  0.39  CREATININE 0.61   < >  --  0.66  --  0.55* 0.52*  --   TROPONINIHS 5  --  8  --   --   --   --   --    < > = values in this interval not displayed.    Estimated Creatinine Clearance: 92.5 mL/min (A) (by C-G formula based on SCr of 0.52 mg/dL (L)).   Medical History: Past Medical History:  Diagnosis Date  . Patient denies medical problems   . Pulmonary fibrosis (HCC)     Medications:  Per chart review, no PTA anticoagulation  Assessment: 69 year old male with new onset Afib started on diltiazem drip and digoxin with PMH significant for pulmonary fibrosis. Patient was originally going to receive anticoagulation with Apixaban, however is unable to take medications by mouth due to inability to remove BiPAP. Pharmacy has been consulted for heparin dosing and monitoring.  Hgb 11.6, Plt slightly elevated 412  Goal of Therapy:  Heparin level 0.3-0.7 units/ml Monitor platelets by anticoagulation protocol: Yes   Plan:  Give 4000 units bolus x 1 Start heparin infusion at 1150 units/hr Check anti-Xa level in 6 hours and daily while on heparin Continue to monitor H&H and platelets  9/23:  HL @ 2352  = 0.39 Will continue pt on current rate and recheck HL in 6 hrs on 9/24 @ 0600.   10/24, PharmD Pharmacy Resident  02/12/2020 12:31 AM

## 2020-02-12 NOTE — Progress Notes (Signed)
Shift summary:  - Patient remains intubated and sedated. - Wife at bedside.

## 2020-02-12 NOTE — Consult Note (Signed)
ANTICOAGULATION CONSULT NOTE - Initial Consult  Pharmacy Consult for Heparin infusion Indication: atrial fibrillation  No Known Allergies  Patient Measurements: Height: 5\' 10"  (177.8 cm) Weight:  (patient could not be weight at the time. nurse notified.) IBW/kg (Calculated) : 73 Heparin Dosing Weight: 80.5 kg  Vital Signs: Temp: 98.1 F (36.7 C) (09/24 0245) Temp Source: Axillary (09/24 0245) BP: 97/55 (09/24 0000) Pulse Rate: 82 (09/24 0000)  Labs: Recent Labs    02/09/20 0714 02/09/20 0714 02/09/20 1039 02/10/20 0432 02/10/20 0432 02/11/20 0335 02/11/20 0335 02/11/20 1908 02/11/20 2352 02/12/20 0544  HGB 11.9*   < >  --  11.6*   < > 11.6*   < > 12.6*  --  10.2*  HCT 36.8*   < >  --  37.0*   < > 36.7*  --  40.8  --  32.5*  PLT 378   < >  --  416*   < > 412*  --  487*  --  352  HEPARINUNFRC  --   --   --   --   --   --   --   --  0.39 0.30  CREATININE 0.61   < >  --  0.66  --  0.55*  --  0.52*  --   --   TROPONINIHS 5  --  8  --   --   --   --   --   --   --    < > = values in this interval not displayed.    Estimated Creatinine Clearance: 92.5 mL/min (A) (by C-G formula based on SCr of 0.52 mg/dL (L)).   Medical History: Past Medical History:  Diagnosis Date  . Patient denies medical problems   . Pulmonary fibrosis (HCC)     Medications:  Per chart review, no PTA anticoagulation  Assessment: 69 year old male with new onset Afib started on diltiazem drip and digoxin with PMH significant for pulmonary fibrosis. Patient was originally going to receive anticoagulation with Apixaban, however is unable to take medications by mouth due to inability to remove BiPAP. Pharmacy has been consulted for heparin dosing and monitoring.  Hgb 11.6, Plt slightly elevated 412  Goal of Therapy:  Heparin level 0.3-0.7 units/ml Monitor platelets by anticoagulation protocol: Yes   Plan:  Give 4000 units bolus x 1 Start heparin infusion at 1150 units/hr Check anti-Xa level  in 6 hours and daily while on heparin Continue to monitor H&H and platelets  9/23:  HL @ 2352 = 0.39 Will continue pt on current rate and recheck HL in 6 hrs on 9/24 @ 0600.   9/24:  HL @ 0544 = 03 Will continue pt on current rate and recheck HL on 9/25 with AM labs.   10/25, PharmD Pharmacy Resident  02/12/2020 6:50 AM

## 2020-02-12 NOTE — Consult Note (Signed)
Pharmacy Antibiotic Note  Donald Sellers is a 69 y.o. male with medical history significant for pulmonary fibrosis admitted on 03/05/20 with pneumonia. Labs significant for leukocytosis with mild left shift.  Imaging worrisome for progressive fibrotic lung disease; right upper lobe consolidation suggestive of acute infectious cause of current exacerbation. Patient was given one time doses of vancomycin, cefepime, and azithromycin for sepsis. Abx were switched to IV Levaquin for CAP then broadened to Zosyn. Pharmacy has been consulted for Zosyn dosing.   Plan: Continue Zosyn 3.375g IV q8h (4 hour infusion)   Height: 5\' 10"  (177.8 cm) Weight:  (patient could not be weight at the time. nurse notified.) IBW/kg (Calculated) : 73  Temp (24hrs), Avg:97.7 F (36.5 C), Min:97.1 F (36.2 C), Max:98.1 F (36.7 C)  Recent Labs  Lab 03/05/2020 0754 03/05/2020 0754 02/09/20 0714 02/10/20 0432 02/11/20 0335 02/11/20 1908 02/12/20 0544  WBC 18.0*   < > 18.9* 27.6* 25.0* 28.0* 17.5*  CREATININE 0.63   < > 0.61 0.66 0.55* 0.52* 0.55*  LATICACIDVEN 1.5  --   --   --   --   --   --    < > = values in this interval not displayed.    Estimated Creatinine Clearance: 92.5 mL/min (A) (by C-G formula based on SCr of 0.55 mg/dL (L)).    No Known Allergies  Antimicrobials this admission: Vancomycin/cefepime/azithromycin/Levaquin 9/20 x 1  Zosyn 9/21 >>  Doxycycline 9/23 >>  Dose adjustments this admission: n/a  Microbiology results: 9/23 Trach Aspirate: NG pending 9/21 UCx: Negative  9/20 BCx: NGTD 9/20 Respiratory panel: Negative 9/20 Sputum: normal flora 9/20 MRSA PCR: Negative 9/23 Strep pneumo and legionella pending  Thank you for allowing pharmacy to be a part of this patient's care.  10/23, PharmD 02/12/2020 3:49 PM

## 2020-02-12 NOTE — Progress Notes (Signed)
Name: Donald Sellers MRN: [id number] DOB: November 18, 1950    ADMISSION DATE:  01/24/2020 CONSULTATION DATE:  02/11/2020  REFERRING MD :  Dr. Arbutus Ped  CHIEF COMPLAINT:  Acute Respiratory Distress  BRIEF PATIENT DESCRIPTION:  69 y.o. Male admitted with Acute on Chronic Hypoxic Respiratory Failure in the setting of Community Acquired Pneumonia and Acute Exacerbation of Idiopathic Pulmonary Fibrosis.  Required emergent intubation on 02/11/20.  SIGNIFICANT EVENTS  02/09/20- Patient in [place] during evaluation this am.  He was placed on BIPAP and is on cardizem gtt.  He may need closer monitoring since he is now on drip and NIV.  Will attempt to wean off both today and if unable to will advance care to SDU.  02/10/20- patient is improved, he has + bacterial respiratory culture with strep species.   His AFRVR is also improved and he has been seen by cariology.  He shares dyspnea and SOB is somewhat improved and he is off BIPAP during my evaluation.  02/11/20: Acute Respiratory Distress requiring emergent intubation  STUDIES:  9/20: CXR>>Widespread fibrosis, essentially stable. It is difficult to exclude a degree of underlying emphysematous change. The appearance is essentially stable compared to prior study. No consolidation evident. Heart upper normal in size. 9/20: CTA Chest>>1. No evidence of pulmonary embolism. 2. Progression of extensive bilateral interstitial and consolidative opacities, even compared to recent chest CT performed 12/11/2019, worrisome for progressive fibrotic lung disease, nonspecific though suggestive of UIP given probable honeycombing within the right lower lobe. 3. Improved aeration of previously noted consolidative opacities within the left upper lobe suggestive of resolved infection and/or inflammation. 4. Redemonstrated mediastinal and bilateral hilar lymphadenopathy, nonspecific though presumably reactive in etiology.  CULTURES: SARS-CoV-2 PCR 9/20>>  negative Respiratory viral panel 9/20>> negative Blood culture x2 9/20>> no growth to date Urine 9/21>> no growth Sputum culture 9/21>> normal respiratory flora (no staph aureus or Pseudomonas) Sputum culture 9/23>>  ANTIBIOTICS: Zosyn  Doxycycline  PATIENT PROFILE:   Donald Sellers is a 69 year old male with a past medical history significant for pulmonary fibrosis on 4 L/min home O2 who presented to [facility] ED on 01/22/2020 due to worsening shortness of breath from his baseline with increased O2 requirements and associated productive cough.  Of note he was recently diagnosed with pulmonary fibrosis in July 20 1:21 year of progressive shortness of breath.  Upon presentation to the ED he was noted to be hypoxic with O2 sats in the low 80s on his home 4 L of O2, which he was placed on nonrebreather mask.  Initial work-up in the ED showed leukocytosis with left shift, normal lactic acid.  CTA chest was negative for PE, did show progression of extensive bilateral interstitial and consolidative opacities which were worrisome for progressive fibrotic lung disease and questionable pneumonia.    He was admitted by the hospitalist for further work-up and treatment of acute on chronic hypoxic respiratory failure in the setting of community-acquired pneumonia and acute exacerbation of pulmonary fibrosis.  Pulmonary was consulted.  On 02/11/2020 he developed acute respiratory distress requiring transfer to ICU and subsequent intubation.  PCCM is consulted for further management.  No Known Allergies   Current inpatient medications reviewed.  REVIEW OF SYSTEMS:   Unable to assess due to critical illness, intubation and sedation  SUBJECTIVE:  Unable to assess due to critical illness, intubation and sedation  VITAL SIGNS: Temp:  [97.1 F (36.2 C)-98.1 F (36.7 C)] 97.7 F (36.5 C) (09/24 1600) Pulse Rate:  [71-130] 130 (09/24 1900) Resp:  [  18-34] 34 (09/24 1900) BP: (75-105)/(46-84) 86/60 (09/24  1900) SpO2:  [90 %-99 %] 93 % (09/24 1900) FiO2 (%):  [50 %-90 %] 50 % (09/24 1736)   Ventilator parameters viewed with RT  PHYSICAL EXAMINATION: General:  Critically ill appearing male, laying in bed, intubated and sedated, NAD following versed administration Neuro:  Heavily sedated, pupils PERRLA HEENT:  Atraumatic, normocephalic, neck supple, no JVD Cardiovascular:  Tachycardia, irregularly irregular rhythm, no M/R/G, 1+ distal pulses Lungs:  Coarse breath sounds to auscultation bilaterally, vent assisted, even Abdomen:  Soft, nondistended,BS+ x4 Musculoskeletal:  Normal bulk and tone, no deformities, no edema Skin:  Warm and dry. No obvious rashes, lesions, or ulcerations  Recent Labs  Lab 02/11/20 0335 02/11/20 1908 02/12/20 0544  NA 140 143 146*  K 4.2 4.4 4.5  CL 99 99 103  CO2 32 33* 35*  BUN 22 21 23   CREATININE 0.55* 0.52* 0.55*  GLUCOSE 151* 191* 199*   Recent Labs  Lab 02/11/20 0335 02/11/20 1908 02/12/20 0544  HGB 11.6* 12.6* 10.2*  HCT 36.7* 40.8 32.5*  WBC 25.0* 28.0* 17.5*  PLT 412* 487* 352   DG Chest Port 1 View  Result Date: 02/12/2020 CLINICAL DATA:  Line placements EXAM: PORTABLE CHEST 1 VIEW COMPARISON:  02/12/2020 FINDINGS: An endotracheal tube is present with tip measuring 7.4 cm above the carina. Enteric tube tip is off the field of view but below the left hemidiaphragm. Proximal side hole is projected distal to the expected location of the EG junction. Left PICC line placed with tip over the cavoatrial junction region. There is evidence of new subcutaneous emphysema in the right supraclavicular region with new pneumo mediastinum. No definite pneumothorax although pneumothorax could be obscured by the right chest/neck process. Low lung volumes with diffuse interstitial pattern to the lungs. No pleural effusions. Cardiac enlargement. IMPRESSION: 1. Appliances appear in satisfactory position. 2. New subcutaneous emphysema in the right supraclavicular  region with new pneumo mediastinum. 3. Cardiac enlargement. 4. Low lung volumes with diffuse interstitial pattern to the lungs. Critical Value/emergent results were called by telephone at the time of interpretation on 02/12/2020 at 8:31 pm to provider [facility], who verbally acknowledged these results. Electronically Signed   By: Lucienne Capers M.D.   On: 02/12/2020 20:42   Portable Chest xray  Result Date: 02/12/2020 CLINICAL DATA:  Hypoxia EXAM: PORTABLE CHEST 1 VIEW COMPARISON:  February 11, 2020 chest radiograph and chest CT February 08, 2020 FINDINGS: Endotracheal tube tip is 4.4 cm from the carina. Nasogastric tube tip and side port are below the diaphragm. No pneumothorax. Widespread bilateral airspace opacity persists without appreciable change. Underlying changes of fibrosis noted. Heart size and pulmonary vascularity are within normal limits. No evident adenopathy. No bone lesions. IMPRESSION: Tube positions as described without pneumothorax. Extensive multifocal airspace opacity bilaterally, superimposed on fibrosis. Suspect multifocal pneumonia, although pulmonary edema and/or ARDS superimposed may well be present. No new opacity evident. Stable cardiac silhouette. Electronically Signed   By: Lowella Grip III M.D.   On: 02/12/2020 07:57   Portable Chest x-ray  Result Date: 02/11/2020 CLINICAL DATA:  Endotracheal tube placement. EXAM: PORTABLE CHEST 1 VIEW COMPARISON:  02/09/2020 FINDINGS: Endotracheal tube in good position. NG tube enters the stomach with the tip not visualized Severe diffuse bilateral airspace disease. Mild improvement in lung volume since the prior study. No pneumothorax or effusion IMPRESSION: Endotracheal tube in good position. Severe bilateral lung disease with improved lung volume. Electronically Signed   By: Franchot Gallo  M.D.   On: 02/11/2020 19:43   [place] EKG SITE RITE  Result Date: 02/12/2020 If Site Rite image not attached, placement could not be confirmed  due to current cardiac rhythm.   ASSESSMENT / PLAN:  Acute on Chronic Hypoxic Respiratory Failure secondary to Acute Exacerbation of Idiopathic Pulmonary Fibrosis & Community Acquired Pneumonia Hx: Baseline 4-5 L O2 requirement -Continue full Vent support -Wean FiO2 & PEEP as tolerated to maintain O2 sats >92% -Follow intermittent CXR & ABG as needed -VAP Protocol -Spontaneous breathing trials when respiratory parameters met -Continue Steroids -Continue Doxycycline & Zosyn -PRN Bronchodilators   Severe Sepsis secondary to Community Acquired Pneumonia -Monitor fever curve -Trend WBC's & Procalcitonin -Follow cultures as above -Continue Zosyn & Doxycycline   Atrial Fibrillation w/ RVR -Continuous cardiac monitoring -Maintain MAP >65 -Cardiology following, appreciate input ~ follow up recommendations -Continue Cardizem & Heparin gtts -Avoiding amiodarone due to pulmonary fibrosis concerns   Hyperglycemia -CBG's -SSI -Follow ICU Hypo/Hyperglycemia protocol   GERD -NPO -IV Protonix BID   Sedation needs in setting of mechanical ventilation -Maintain RASS goal 0 to -1 -Fentanyl and Propofol gtts as needed to maintain RASS goal -Avoid sedating meds as able -Daily wake up assessment -Provide supportive care    Pt is critically ill, prognosis is guarded.  He is at high risk for cardiac arrest and death.   BEST PRACTICES: DISPOSITION: ICU GOALS OF CARE: Full Code VTE PROPHYLAXIS/ANTICOAGULATION: Heparin gtt SUP: IV Protonix CONSULS: Cardiology, Dr. Humphrey Rolls UPDATES: Pt's wife updated by me at bedside 02/12/20  Critical care time: 40 minutes.  Multidisciplinary rounds were performed with ICU team.   Renold Don, MD [facility] 02/12/2020, 9:40 PM    *This note was dictated using voice recognition software/Dragon.  Despite best efforts to proofread, errors can occur which can change the meaning.  Any change was purely unintentional.

## 2020-02-12 NOTE — Progress Notes (Signed)
Peripherally Inserted Central Catheter Placement  The IV Nurse has discussed with the patient and/or persons authorized to consent for the patient, the purpose of this procedure and the potential benefits and risks involved with this procedure.  The benefits include less needle sticks, lab draws from the catheter, and the patient may be discharged home with the catheter. Risks include, but not limited to, infection, bleeding, blood clot (thrombus formation), and puncture of an artery; nerve damage and irregular heartbeat and possibility to perform a PICC exchange if needed/ordered by physician.  Alternatives to this procedure were also discussed.  Bard Power PICC patient education guide, fact sheet on infection prevention and patient information card has been provided to patient /or left at bedside.    PICC Placement Documentation  PICC Triple Lumen 02/12/20 PICC Left Brachial 47 cm 0 cm (Active)  Indication for Insertion or Continuance of Line Vasoactive infusions 02/12/20 1955  Exposed Catheter (cm) 0 cm 02/12/20 1955  Site Assessment Clean;Dry;Intact 02/12/20 1955  Lumen #1 Status Blood return noted;Flushed;Saline locked 02/12/20 1955  Lumen #2 Status Blood return noted;Flushed;Saline locked 02/12/20 1955  Lumen #3 Status Blood return noted;Flushed;Saline locked 02/12/20 1955  Dressing Type Transparent;Occlusive;Securing device 02/12/20 1955  Dressing Status Clean;Dry;Intact 02/12/20 1955  Antimicrobial disc in place? Yes 02/12/20 1955  Safety Lock Not Applicable 02/12/20 1955  Line Adjustment (NICU/IV Team Only) No 02/12/20 1955  Dressing Intervention New dressing 02/12/20 1955  Dressing Change Due 02/19/20 02/12/20 1955       Christeen Douglas 02/12/2020, 8:12 PM

## 2020-02-12 NOTE — Progress Notes (Signed)
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  RN is aware

## 2020-02-12 NOTE — Plan of Care (Signed)
  Problem: Education: Goal: Knowledge of General Education information will improve Description: Including pain rating scale, medication(s)/side effects and non-pharmacologic comfort measures Outcome: Progressing   Problem: Activity: Goal: Risk for activity intolerance will decrease Outcome: Progressing   Problem: Pain Managment: Goal: General experience of comfort will improve Outcome: Progressing   Problem: Safety: Goal: Ability to remain free from injury will improve Outcome: Progressing   Problem: Skin Integrity: Goal: Risk for impaired skin integrity will decrease Outcome: Progressing   Problem: Fluid Volume: Goal: Hemodynamic stability will improve Outcome: Progressing   Problem: Respiratory: Goal: Ability to maintain adequate ventilation will improve Outcome: Progressing   

## 2020-02-12 NOTE — Progress Notes (Addendum)
Initial Nutrition Assessment  DOCUMENTATION CODES:   Not applicable  INTERVENTION:   Vital 1.2 @55ml /hr- Initiate at 5ml/hr and increase by 33ml/hr q 8 hours until goal rate is reached.   Pro-Source 44ml TID via tube  Propofol: 7.2 ml/hr- provides 190kcal   Free water flushes 27ml q4 hours to maintain tube patency   Regimen provides 1894kcal/day, 132g/day protein and 1261ml/day free water  Pt at moderate refeed risk; recommend monitor potassium, magnesium and phosphorus labs daily until stable  NUTRITION DIAGNOSIS:   Inadequate oral intake related to inability to eat (pt sedated and ventilated) as evidenced by NPO status.  GOAL:   Provide needs based on ASPEN/SCCM guidelines  MONITOR:   Vent status, Labs, Weight trends, TF tolerance, Skin, I & O's  REASON FOR ASSESSMENT:   Ventilator    ASSESSMENT:   69 y.o. Male admitted with Acute on Chronic Hypoxic Respiratory Failure in the setting of Community Acquired Pneumonia and Acute Exacerbation of Idiopathic Pulmonary Fibrosis.  Required emergent intubation on 02/11/20.   Pt s/p ORIF 8/9  Pt sedated and ventilated. No family at beside. Per chart review, pt eating 100% of meals on 9/22. There is no weight history in chart to determine if any significant weight changes. OGT in place. Plan is to initiate tube feeds today.   Medications reviewed and include: insulin, solu-medrol, protonix, doxycycline, fentanyl, heparin, propofol  Labs reviewed: creat 0.55(L) P 3.6 wnl, Mg 2.5(H)- 9/23 Wbc- 17.5(H)  Patient is currently intubated on ventilator support MV: 16.1 L/min Temp (24hrs), Avg:97.7 F (36.5 C), Min:97.1 F (36.2 C), Max:98.1 F (36.7 C)  Propofol: 7.2 ml/hr- provides 190kcal   MAP- >65mmHg   UOP- 1156ml/hr   NUTRITION - FOCUSED PHYSICAL EXAM:    Most Recent Value  Orbital Region Mild depletion  Upper Arm Region Mild depletion  Thoracic and Lumbar Region Mild depletion  Buccal Region No depletion   Temple Region Mild depletion  Clavicle Bone Region No depletion  Clavicle and Acromion Bone Region No depletion  Scapular Bone Region Unable to assess  Dorsal Hand Unable to assess  Patellar Region Mild depletion  Anterior Thigh Region Mild depletion  Posterior Calf Region Mild depletion  Edema (RD Assessment) None  Hair Reviewed  Eyes Reviewed  Mouth Reviewed  Skin Reviewed  Nails Reviewed     Diet Order:   Diet Order            Diet NPO time specified  Diet effective now                EDUCATION NEEDS:   No education needs have been identified at this time  Skin:  Skin Assessment: Reviewed RN Assessment (incision L ankle, ecchymosis)  Last BM:  pta  Height:   Ht Readings from Last 1 Encounters:  02/09/20 5\' 10"  (1.778 m)    Weight:   Wt Readings from Last 1 Encounters:  02/10/20 80.5 kg    Ideal Body Weight:  75.45 kg  BMI:  Body mass index is 25.45 kg/m.  Estimated Nutritional Needs:   Kcal:  1942kcal/day  Protein:  115-130g/day  Fluid:  1.9-2.2L/day  MS, RD, LDN Please refer to [facility] for RD and/or RD on-call/weekend/after hours pager

## 2020-02-13 ENCOUNTER — Inpatient Hospital Stay: Payer: BC Managed Care – PPO

## 2020-02-13 DIAGNOSIS — J9601 Acute respiratory failure with hypoxia: Secondary | ICD-10-CM

## 2020-02-13 DIAGNOSIS — J189 Pneumonia, unspecified organism: Secondary | ICD-10-CM

## 2020-02-13 LAB — GLUCOSE, CAPILLARY
Glucose-Capillary: 136 mg/dL — ABNORMAL HIGH (ref 70–99)
Glucose-Capillary: 151 mg/dL — ABNORMAL HIGH (ref 70–99)
Glucose-Capillary: 156 mg/dL — ABNORMAL HIGH (ref 70–99)
Glucose-Capillary: 166 mg/dL — ABNORMAL HIGH (ref 70–99)
Glucose-Capillary: 169 mg/dL — ABNORMAL HIGH (ref 70–99)
Glucose-Capillary: 169 mg/dL — ABNORMAL HIGH (ref 70–99)
Glucose-Capillary: 170 mg/dL — ABNORMAL HIGH (ref 70–99)

## 2020-02-13 LAB — CULTURE, BLOOD (ROUTINE X 2)
Culture: NO GROWTH
Culture: NO GROWTH
Special Requests: ADEQUATE

## 2020-02-13 LAB — CBC
HCT: 34 % — ABNORMAL LOW (ref 39.0–52.0)
Hemoglobin: 10.7 g/dL — ABNORMAL LOW (ref 13.0–17.0)
MCH: 28.7 pg (ref 26.0–34.0)
MCHC: 31.5 g/dL (ref 30.0–36.0)
MCV: 91.2 fL (ref 80.0–100.0)
Platelets: 366 10*3/uL (ref 150–400)
RBC: 3.73 MIL/uL — ABNORMAL LOW (ref 4.22–5.81)
RDW: 13.5 % (ref 11.5–15.5)
WBC: 20.5 10*3/uL — ABNORMAL HIGH (ref 4.0–10.5)
nRBC: 0 % (ref 0.0–0.2)

## 2020-02-13 LAB — CULTURE, RESPIRATORY W GRAM STAIN: Culture: NORMAL

## 2020-02-13 LAB — BASIC METABOLIC PANEL
Anion gap: 9 (ref 5–15)
BUN: 32 mg/dL — ABNORMAL HIGH (ref 8–23)
CO2: 34 mmol/L — ABNORMAL HIGH (ref 22–32)
Calcium: 8.7 mg/dL — ABNORMAL LOW (ref 8.9–10.3)
Chloride: 104 mmol/L (ref 98–111)
Creatinine, Ser: 0.55 mg/dL — ABNORMAL LOW (ref 0.61–1.24)
GFR calc Af Amer: >60 mL/min (ref >60)
GFR calc non Af Amer: >60 mL/min (ref >60)
Glucose, Bld: 181 mg/dL — ABNORMAL HIGH (ref 70–99)
Potassium: 4.4 mmol/L (ref 3.5–5.1)
Sodium: 147 mmol/L — ABNORMAL HIGH (ref 135–145)

## 2020-02-13 LAB — PROCALCITONIN: Procalcitonin: <0.1 ng/mL

## 2020-02-13 LAB — HEPARIN LEVEL (UNFRACTIONATED): Heparin Unfractionated: 0.42 IU/mL (ref 0.30–0.70)

## 2020-02-13 LAB — MAGNESIUM: Magnesium: 2.7 mg/dL — ABNORMAL HIGH (ref 1.7–2.4)

## 2020-02-13 MED ORDER — MONTELUKAST SODIUM 10 MG PO TABS: 10.0000 mg | ORAL_TABLET | Freq: Every day | ORAL | Status: DC

## 2020-02-13 MED ORDER — FREE WATER
200.0000 mL | Status: DC
  Administered 2020-02-13 – 2020-02-22 (×54): 200 mL

## 2020-02-13 MED ORDER — PHENYLEPHRINE HCL-NACL 10-0.9 MG/250ML-% IV SOLN
0.0000 ug/min | INTRAVENOUS | Status: DC
  Filled 2020-02-13: qty 250

## 2020-02-13 MED ORDER — LORAZEPAM 2 MG/ML IJ SOLN: 2.0000 mg | Freq: Once | INTRAMUSCULAR | Status: AC

## 2020-02-13 MED ORDER — SODIUM CHLORIDE 0.9 % IV SOLN
0.0000 ug/min | INTRAVENOUS | Status: DC
  Administered 2020-02-13 (×2): 50 ug/min via INTRAVENOUS
  Administered 2020-02-13: 20 ug/min via INTRAVENOUS
  Administered 2020-02-14: 30 ug/min via INTRAVENOUS
  Administered 2020-02-14: 50 ug/min via INTRAVENOUS
  Administered 2020-02-14: 10 ug/min via INTRAVENOUS
  Administered 2020-02-15 (×2): 20 ug/min via INTRAVENOUS
  Administered 2020-02-16: 25 ug/min via INTRAVENOUS
  Administered 2020-02-16 (×2): 20 ug/min via INTRAVENOUS
  Administered 2020-02-17: 25 ug/min via INTRAVENOUS
  Filled 2020-02-13: qty 10
  Filled 2020-02-13 (×2): qty 1
  Filled 2020-02-13 (×5): qty 10
  Filled 2020-02-13: qty 1
  Filled 2020-02-13 (×2): qty 10
  Filled 2020-02-13: qty 1
  Filled 2020-02-13 (×2): qty 10

## 2020-02-13 MED ORDER — LORAZEPAM 2 MG/ML IJ SOLN
INTRAMUSCULAR | Status: AC
  Administered 2020-02-13: 2 mg via INTRAVENOUS
  Filled 2020-02-13: qty 1

## 2020-02-13 NOTE — Plan of Care (Signed)
  Problem: Education: Goal: Knowledge of General Education information will improve Description: Including pain rating scale, medication(s)/side effects and non-pharmacologic comfort measures Outcome: Not Progressing   Problem: Activity: Goal: Risk for activity intolerance will decrease Outcome: Not Progressing   Problem: Pain Managment: Goal: General experience of comfort will improve Outcome: Not Progressing   Problem: Safety: Goal: Ability to remain free from injury will improve Outcome: Not Progressing   Problem: Skin Integrity: Goal: Risk for impaired skin integrity will decrease Outcome: Not Progressing   Problem: Fluid Volume: Goal: Hemodynamic stability will improve Outcome: Not Progressing   Problem: Respiratory: Goal: Ability to maintain adequate ventilation will improve Outcome: Not Progressing

## 2020-02-13 NOTE — Consult Note (Signed)
Pharmacy Antibiotic Note  Donald Sellers is a 69 y.o. male with medical history significant for pulmonary fibrosis admitted on 02-20-20 with pneumonia. Labs significant for leukocytosis with mild left shift.  Imaging worrisome for progressive fibrotic lung disease; right upper lobe consolidation suggestive of acute infectious cause of current exacerbation. Patient was given one time doses of vancomycin, cefepime, and azithromycin for sepsis. Abx were switched to IV Levaquin for CAP then broadened to Zosyn. Pharmacy has been consulted for Zosyn dosing.   Plan: Day 5 of abx. Continue Zosyn 3.375g IV q8h (4 hour infusion). Day 3 of doxycycline    Height: 5\' 10"  (177.8 cm) Weight:  (patient could not be weight at the time. nurse notified.) IBW/kg (Calculated) : 73  Temp (24hrs), Avg:98.4 F (36.9 C), Min:97.5 F (36.4 C), Max:99.1 F (37.3 C)  Recent Labs  Lab 02/20/2020 0754 02/09/20 0714 02/10/20 0432 02/11/20 0335 02/11/20 1908 02/12/20 0544 02/13/20 0403  WBC 18.0*   < > 27.6* 25.0* 28.0* 17.5* 20.5*  CREATININE 0.63   < > 0.66 0.55* 0.52* 0.55* 0.55*  LATICACIDVEN 1.5  --   --   --   --   --   --    < > = values in this interval not displayed.    Estimated Creatinine Clearance: 92.5 mL/min (A) (by C-G formula based on SCr of 0.55 mg/dL (L)).    No Known Allergies  Antimicrobials this admission: Vancomycin/cefepime/azithromycin/Levaquin 9/20 x 1  Zosyn 9/21 >>  Doxycycline 9/23 >>  Dose adjustments this admission: n/a  Microbiology results: 9/23 Trach Aspirate: NG pending 9/21 UCx: Negative  9/20 BCx: NGTD 9/20 Respiratory panel: Negative 9/20 Sputum: normal flora 9/20 MRSA PCR: Negative 9/23 Strep pneumo and legionella pending  Thank you for allowing pharmacy to be a part of this patient's care.  10/23, PharmD 02/13/2020 12:35 PM

## 2020-02-13 NOTE — Progress Notes (Signed)
SUBJECTIVE: Patient remains intubated   Vitals:   02/13/20 1230 02/13/20 1245 02/13/20 1300 02/13/20 1400  BP: (!) 78/56 (!) 88/53 (!) 94/56 (!) 91/56  Pulse: (!) 106 (!) 111 (!) 110 (!) 113  Resp: (!) 25 (!) 34 (!) 25 (!) 8  Temp:      TempSrc:      SpO2: 95% 96% 93% 94%  Weight:      Height:        Intake/Output Summary (Last 24 hours) at 02/13/2020 1418 Last data filed at 02/13/2020 1400 Gross per 24 hour  Intake 2337.12 ml  Output 1365 ml  Net 972.12 ml    LABS: Basic Metabolic Panel: Recent Labs    02/11/20 0335 02/11/20 0956 02/11/20 0956 02/11/20 1908 02/11/20 1908 02/11/20 2352 02/12/20 0544 02/13/20 0403  NA   < >  --   --  143   < >  --  146* 147*  K   < >  --   --  4.4   < >  --  4.5 4.4  CL   < >  --   --  99   < >  --  103 104  CO2   < >  --   --  33*   < >  --  35* 34*  GLUCOSE   < >  --   --  191*   < >  --  199* 181*  BUN   < >  --   --  21   < >  --  23 32*  CREATININE   < >  --   --  0.52*   < >  --  0.55* 0.55*  CALCIUM   < >  --   --  8.6*   < >  --  8.9 8.7*  MG  --  2.6*   < >  --   --  2.5*  --  2.7*  PHOS  --  3.5  --  3.6  --   --   --   --    < > = values in this interval not displayed.   Liver Function Tests: Recent Labs    02/11/20 1908  ALBUMIN 3.0*   No results for input(s): LIPASE, AMYLASE in the last 72 hours. CBC: Recent Labs    02/11/20 1908 02/11/20 1908 02/12/20 0544 02/13/20 0403  WBC 28.0*   < > 17.5* 20.5*  NEUTROABS 24.7*  --   --   --   HGB 12.6*   < > 10.2* 10.7*  HCT 40.8   < > 32.5* 34.0*  MCV 93.4   < > 94.5 91.2  PLT 487*   < > 352 366   < > = values in this interval not displayed.   Cardiac Enzymes: No results for input(s): CKTOTAL, CKMB, CKMBINDEX, TROPONINI in the last 72 hours. BNP: Invalid input(s): POCBNP D-Dimer: No results for input(s): DDIMER in the last 72 hours. Hemoglobin A1C: Recent Labs    02/12/20 0544  HGBA1C 5.9*   Fasting Lipid Panel: Recent Labs    02/11/20 1908  TRIG  110   Thyroid Function Tests: No results for input(s): TSH, T4TOTAL, T3FREE, THYROIDAB in the last 72 hours.  Invalid input(s): FREET3 Anemia Panel: No results for input(s): VITAMINB12, FOLATE, FERRITIN, TIBC, IRON, RETICCTPCT in the last 72 hours.   PHYSICAL EXAM General: Well developed, well nourished, in no acute distress HEENT:  Normocephalic and atramatic Neck:  No JVD.  Lungs: Clear bilaterally to auscultation and percussion. Heart: HRRR . Normal S1 and S2 without gallops or murmurs.  Abdomen: Bowel sounds are positive, abdomen soft and non-tender  Msk:  Back normal, normal gait. Normal strength and tone for age. Extremities: No clubbing, cyanosis or edema.   Neuro: Alert and oriented X 3. Psych:  Good affect, responds appropriately  TELEMETRY: Atrial fibrillation with rapid ventricular response rate about 120 bpm  ASSESSMENT AND PLAN: Atrial fibrillation with rapid ventricular response rate and respiratory failure.  Continue digoxin.  Blood pressure is low so we will cannot start the patient on metoprolol since patient has also pulmonary fibrosis.  Digoxin is being given at the maximum dose.  Advise getting dig level.  Principal Problem:   Acute on chronic respiratory failure with hypoxia (HCC) Active Problems:   Gastroesophageal reflux disease   CAP (community acquired pneumonia)   Sepsis (HCC)   Pulmonary fibrosis (HCC)   Atrial fibrillation with RVR (HCC)    Donald Sellers A, MD, [facility] 02/13/2020 2:18 PM

## 2020-02-13 NOTE — Progress Notes (Addendum)
Shift summary:  - Patient remains intubated and sedated.  - A. Fib with RVR, hypotension; MD aware.  - Now requiring vasopressors for BP support.

## 2020-02-13 NOTE — Consult Note (Signed)
ANTICOAGULATION CONSULT NOTE - Initial Consult  Pharmacy Consult for Heparin infusion Indication: atrial fibrillation  No Known Allergies  Patient Measurements: Height: 5\' 10"  (177.8 cm) Weight:  (patient could not be weight at the time. nurse notified.) IBW/kg (Calculated) : 73 Heparin Dosing Weight: 80.5 kg  Vital Signs: Temp: 97.5 F (36.4 C) (09/25 0400) Temp Source: Oral (09/25 0400) BP: 86/60 (09/24 1900) Pulse Rate: 130 (09/24 1900)  Labs: Recent Labs    02/11/20 1908 02/11/20 1908 02/11/20 2352 02/12/20 0544 02/13/20 0403  HGB 12.6*   < >  --  10.2* 10.7*  HCT 40.8  --   --  32.5* 34.0*  PLT 487*  --   --  352 366  HEPARINUNFRC  --   --  0.39 0.30 0.42  CREATININE 0.52*  --   --  0.55* 0.55*   < > = values in this interval not displayed.    Estimated Creatinine Clearance: 92.5 mL/min (A) (by C-G formula based on SCr of 0.55 mg/dL (L)).   Medical History: Past Medical History:  Diagnosis Date  . Patient denies medical problems   . Pulmonary fibrosis (HCC)     Medications:  Per chart review, no PTA anticoagulation  Assessment: 69 year old male with new onset Afib started on diltiazem drip and digoxin with PMH significant for pulmonary fibrosis. Patient was originally going to receive anticoagulation with Apixaban, however is unable to take medications by mouth due to inability to remove BiPAP. Pharmacy has been consulted for heparin dosing and monitoring.  Hgb 11.6, Plt slightly elevated 412  Goal of Therapy:  Heparin level 0.3-0.7 units/ml Monitor platelets by anticoagulation protocol: Yes   Plan:  Give 4000 units bolus x 1 Start heparin infusion at 1150 units/hr Check anti-Xa level in 6 hours and daily while on heparin Continue to monitor H&H and platelets  9/23:  HL @ 2352 = 0.39 Will continue pt on current rate and recheck HL in 6 hrs on 9/24 @ 0600.   9/24:  HL @ 0544 = 03 Will continue pt on current rate and recheck HL on 9/25 with AM  labs.   9/25: HL @ 0403 = 0.42 Will continue pt on current rate and recheck HL on 9/26 with AM labs.   10/26, PharmD Pharmacy Resident  02/13/2020 6:58 AM

## 2020-02-13 NOTE — Consult Note (Signed)
Name: Donald Sellers MRN: [id number] DOB: [birth year]    ADMISSION DATE:  01/31/2020 CONSULTATION DATE:  02/11/2020  REFERRING MD :  Dr. Arbutus Ped  CHIEF COMPLAINT:  Acute Respiratory Distress  BRIEF PATIENT DESCRIPTION:  69 y.o. Male admitted with Acute on Chronic Hypoxic Respiratory Failure in the setting of Community Acquired Pneumonia and Acute Exacerbation of Idiopathic Pulmonary Fibrosis.  Required emergent intubation on 02/11/20.  SIGNIFICANT EVENTS  02/09/20- Patient in [place] during evaluation this am.  He was placed on BIPAP and is on cardizem gtt.  He may need closer monitoring since he is now on drip and NIV.  Will attempt to wean off both today and if unable to will advance care to SDU.  02/10/20- patient is improved, he has + bacterial respiratory culture with strep species.   His AFRVR is also improved and he has been seen by cariology.  He shares dyspnea and SOB is somewhat improved and he is off BIPAP during my evaluation.  02/11/20: Acute Respiratory Distress requiring emergent intubation 9/24 remains intubated   STUDIES:  9/20: CXR>>Widespread fibrosis, essentially stable. It is difficult to exclude a degree of underlying emphysematous change. The appearance is essentially stable compared to prior study. No consolidation evident. Heart upper normal in size. 9/20: CTA Chest>>1. No evidence of pulmonary embolism. 2. Progression of extensive bilateral interstitial and consolidative opacities, even compared to recent chest CT performed 12/11/2019, worrisome for progressive fibrotic lung disease, nonspecific though suggestive of UIP given probable honeycombing within the right lower lobe. 3. Improved aeration of previously noted consolidative opacities within the left upper lobe suggestive of resolved infection and/or inflammation. 4. Redemonstrated mediastinal and bilateral hilar lymphadenopathy, nonspecific though presumably reactive in  etiology.  CULTURES: SARS-CoV-2 PCR 9/20>> negative Respiratory viral panel 9/20>> negative Blood culture x2 9/20>> no growth to date Urine 9/21>> no growth Sputum culture 9/21>> normal respiratory flora (no staph aureus or Pseudomonas) Sputum culture 9/23>>  ANTIBIOTICS: Zosyn  Doxycycline   CC follow up resp failure HPI End stage lung disease Severe resp failure Remains critically ill  Vent Mode: PCV FiO2 (%):  [50 %-90 %] 60 % Set Rate:  [34 bmp] 34 bmp Vt Set:  [440 mL] 440 mL PEEP:  [8 cmH20] 8 cmH20  CBC    Component Value Date/Time   WBC 20.5 (H) 02/13/2020 0403   RBC 3.73 (L) 02/13/2020 0403   HGB 10.7 (L) 02/13/2020 0403   HCT 34.0 (L) 02/13/2020 0403   PLT 366 02/13/2020 0403   MCV 91.2 02/13/2020 0403   MCH 28.7 02/13/2020 0403   MCHC 31.5 02/13/2020 0403   RDW 13.5 02/13/2020 0403   LYMPHSABS 0.9 02/11/2020 1908   MONOABS 2.1 (H) 02/11/2020 1908   EOSABS 0.0 02/11/2020 1908   BASOSABS 0.1 02/11/2020 1908   BMP Latest Ref Rng & Units 02/12/2020 02/11/2020 02/11/2020  Glucose 70 - 99 mg/dL 199(H) 191(H) 151(H)  BUN 8 - 23 mg/dL _0 Creatinine 0.61 - 1.24 mg/dL 0.55(L) 0.52(L) 0.55(L)  Sodium 135 - 145 mmol/L 146(H) 143 140  Potassium 3.5 - 5.1 mmol/L 4.5 4.4 4.2  Chloride 98 - 111 mmol/L 103 99 99  CO2 22 - 32 mmol/L 35(H) 33(H) 32  Calcium 8.9 - 10.3 mg/dL 8.9 8.6(L) 8.8(L)      REVIEW OF SYSTEMS  PATIENT IS UNABLE TO PROVIDE COMPLETE REVIEW OF SYSTEM S DUE TO SEVERE CRITICAL ILLNESS AND ENCEPHALOPATHY  VITAL SIGNS: Temp:  [97.1 F (36.2 C)-98.6 F (37 C)] 97.5 F (  36.4 C) (09/25 0400) Pulse Rate:  [71-130] 130 (09/24 1900) Resp:  [18-34] 34 (09/24 1900) BP: (75-105)/(46-84) 86/60 (09/24 1900) SpO2:  [90 %-99 %] 91 % (09/24 2147) FiO2 (%):  [50 %-90 %] 60 % (09/25 0400)  PHYSICAL EXAMINATION:  GENERAL:critically ill appearing, +resp distress HEAD: Normocephalic, atraumatic.  EYES: Pupils equal, round, reactive to light.  No  scleral icterus.  MOUTH: Moist mucosal membrane. NECK: Supple. No thyromegaly. No nodules. No JVD.  PULMONARY: +rhonchi, +wheezing CARDIOVASCULAR: S1 and S2. Regular rate and rhythm. No murmurs, rubs, or gallops.  GASTROINTESTINAL: Soft, nontender, -distended. Positive bowel sounds.  MUSCULOSKELETAL: No swelling, clubbing, or edema.  NEUROLOGIC: obtunded SKIN:intact,warm,dry    Recent Labs  Lab 02/11/20 0335 02/11/20 1908 02/12/20 0544  NA 140 143 146*  K 4.2 4.4 4.5  CL 99 99 103  CO2 32 33* 35*  BUN _0 CREATININE 0.55* 0.52* 0.55*  GLUCOSE 151* 191* 199*   Recent Labs  Lab 02/11/20 1908 02/12/20 0544 02/13/20 0403  HGB 12.6* 10.2* 10.7*  HCT 40.8 32.5* 34.0*  WBC 28.0* 17.5* 20.5*  PLT 487* 352 366   DG Chest Port 1 View  Result Date: 02/12/2020 CLINICAL DATA:  Line placements EXAM: PORTABLE CHEST 1 VIEW COMPARISON:  02/12/2020 FINDINGS: An endotracheal tube is present with tip measuring 7.4 cm above the carina. Enteric tube tip is off the field of view but below the left hemidiaphragm. Proximal side hole is projected distal to the expected location of the EG junction. Left PICC line placed with tip over the cavoatrial junction region. There is evidence of new subcutaneous emphysema in the right supraclavicular region with new pneumo mediastinum. No definite pneumothorax although pneumothorax could be obscured by the right chest/neck process. Low lung volumes with diffuse interstitial pattern to the lungs. No pleural effusions. Cardiac enlargement. IMPRESSION: 1. Appliances appear in satisfactory position. 2. New subcutaneous emphysema in the right supraclavicular region with new pneumo mediastinum. 3. Cardiac enlargement. 4. Low lung volumes with diffuse interstitial pattern to the lungs. Critical Value/emergent results were called by telephone at the time of interpretation on 02/12/2020 at 8:31 pm to provider [facility], who verbally acknowledged these results.  Electronically Signed   By: Lucienne Capers M.D.   On: 02/12/2020 20:42   Portable Chest xray  Result Date: 02/12/2020 CLINICAL DATA:  Hypoxia EXAM: PORTABLE CHEST 1 VIEW COMPARISON:  February 11, 2020 chest radiograph and chest CT February 08, 2020 FINDINGS: Endotracheal tube tip is 4.4 cm from the carina. Nasogastric tube tip and side port are below the diaphragm. No pneumothorax. Widespread bilateral airspace opacity persists without appreciable change. Underlying changes of fibrosis noted. Heart size and pulmonary vascularity are within normal limits. No evident adenopathy. No bone lesions. IMPRESSION: Tube positions as described without pneumothorax. Extensive multifocal airspace opacity bilaterally, superimposed on fibrosis. Suspect multifocal pneumonia, although pulmonary edema and/or ARDS superimposed may well be present. No new opacity evident. Stable cardiac silhouette. Electronically Signed   By: Lowella Grip III M.D.   On: 02/12/2020 07:57   Portable Chest x-ray  Result Date: 02/11/2020 CLINICAL DATA:  Endotracheal tube placement. EXAM: PORTABLE CHEST 1 VIEW COMPARISON:  02/09/2020 FINDINGS: Endotracheal tube in good position. NG tube enters the stomach with the tip not visualized Severe diffuse bilateral airspace disease. Mild improvement in lung volume since the prior study. No pneumothorax or effusion IMPRESSION: Endotracheal tube in good position. Severe bilateral lung disease with improved lung volume. Electronically Signed   By: Franchot Gallo  M.D.   On: 02/11/2020 19:43   [place] EKG SITE RITE  Result Date: 02/12/2020 If Site Rite image not attached, placement could not be confirmed due to current cardiac rhythm.   ASSESSMENT / PLAN:  Acute on Chronic Hypoxic Respiratory Failure secondary to Acute Exacerbation of Idiopathic Pulmonary Fibrosis & Community Acquired Pneumonia  Baseline 4-5 L O2 requirement  Severe ACUTE Hypoxic and Hypercapnic Respiratory Failure -continue  Mechanical Ventilator support -continue Bronchodilator Therapy -Wean Fio2 and PEEP as tolerated -VAP/VENT bundle implementation -will perform SAT/SBT when respiratory parameters are met  CAP -Follow intermittent CXR & ABG as needed -Continue Steroids -Continue Doxycycline & Zosyn    Severe Sepsis secondary to Community Acquired Pneumonia Continue IV abx  ACUTE  CARDIAC FAILURE-from AFIB with RVR -follow up cardiac enzymes as indicated -follow up cardiology recs -Continue Cardizem & Heparin gtts  ENDO - ICU hypoglycemic\Hyperglycemia protocol -check FSBS per protocol  ELECTROLYTES -follow labs as needed -replace as needed -pharmacy consultation and following   GI GI PROPHYLAXIS as indicated  NUTRITIONAL STATUS DIET-->TF's as tolerated Constipation protocol as indicated    NEUROLOGY Acute toxic metabolic encephalopathy, need for sedation Goal RASS -2 to -3    Critical Care Time devoted to patient care services described in this note is 32 minutes.   Overall, patient is critically ill, prognosis is guarded.  Patient with Multiorgan failure and at high risk for cardiac arrest and death.    I anticipate prolonged ICU LOS, recommend DNR status/palliative care consultation  Corrin Parker, M.D.  [facility]  Medical Director [place] Director [place] Department

## 2020-02-14 ENCOUNTER — Inpatient Hospital Stay
Admit: 2020-02-14 | Discharge: 2020-02-14 | Disposition: A | Discharge status: Home / Self Care | Payer: BC Managed Care – PPO | Attending: Cardiovascular Disease | Admitting: Cardiovascular Disease

## 2020-02-14 LAB — CBC
HCT: 34.7 % — ABNORMAL LOW (ref 39.0–52.0)
Hemoglobin: 10.4 g/dL — ABNORMAL LOW (ref 13.0–17.0)
MCH: 28.6 pg (ref 26.0–34.0)
MCHC: 30 g/dL (ref 30.0–36.0)
MCV: 95.3 fL (ref 80.0–100.0)
Platelets: 401 10*3/uL — ABNORMAL HIGH (ref 150–400)
RBC: 3.64 MIL/uL — ABNORMAL LOW (ref 4.22–5.81)
RDW: 14 % (ref 11.5–15.5)
WBC: 20.6 10*3/uL — ABNORMAL HIGH (ref 4.0–10.5)
nRBC: 0.2 % (ref 0.0–0.2)

## 2020-02-14 LAB — BASIC METABOLIC PANEL
Anion gap: 10 (ref 5–15)
BUN: 33 mg/dL — ABNORMAL HIGH (ref 8–23)
CO2: 33 mmol/L — ABNORMAL HIGH (ref 22–32)
Calcium: 8.3 mg/dL — ABNORMAL LOW (ref 8.9–10.3)
Chloride: 104 mmol/L (ref 98–111)
Creatinine, Ser: 0.5 mg/dL — ABNORMAL LOW (ref 0.61–1.24)
GFR calc Af Amer: >60 mL/min (ref >60)
GFR calc non Af Amer: >60 mL/min (ref >60)
Glucose, Bld: 164 mg/dL — ABNORMAL HIGH (ref 70–99)
Potassium: 4.1 mmol/L (ref 3.5–5.1)
Sodium: 147 mmol/L — ABNORMAL HIGH (ref 135–145)

## 2020-02-14 LAB — HEPARIN LEVEL (UNFRACTIONATED): Heparin Unfractionated: 0.35 IU/mL (ref 0.30–0.70)

## 2020-02-14 LAB — GLUCOSE, CAPILLARY
Glucose-Capillary: 160 mg/dL — ABNORMAL HIGH (ref 70–99)
Glucose-Capillary: 166 mg/dL — ABNORMAL HIGH (ref 70–99)
Glucose-Capillary: 151 mg/dL — ABNORMAL HIGH (ref 70–99)
Glucose-Capillary: 136 mg/dL — ABNORMAL HIGH (ref 70–99)
Glucose-Capillary: 148 mg/dL — ABNORMAL HIGH (ref 70–99)
Glucose-Capillary: 164 mg/dL — ABNORMAL HIGH (ref 70–99)

## 2020-02-14 LAB — LEGIONELLA PNEUMOPHILA SEROGP 1 UR AG: L. pneumophila Serogp 1 Ur Ag: NEGATIVE

## 2020-02-14 LAB — TRIGLYCERIDES: Triglycerides: 101 mg/dL (ref <150)

## 2020-02-14 MED ORDER — PERFLUTREN LIPID MICROSPHERE
1.0000 mL | INTRAVENOUS | Status: AC | PRN
  Administered 2020-02-14: 2 mL via INTRAVENOUS
  Filled 2020-02-14: qty 10

## 2020-02-14 NOTE — Plan of Care (Signed)
  Problem: Education: Goal: Knowledge of General Education information will improve Description: Including pain rating scale, medication(s)/side effects and non-pharmacologic comfort measures Outcome: Not Progressing   Problem: Activity: Goal: Risk for activity intolerance will decrease Outcome: Not Progressing   Problem: Pain Managment: Goal: General experience of comfort will improve Outcome: Not Progressing   Problem: Safety: Goal: Ability to remain free from injury will improve Outcome: Not Progressing   Problem: Skin Integrity: Goal: Risk for impaired skin integrity will decrease Outcome: Not Progressing   Problem: Fluid Volume: Goal: Hemodynamic stability will improve Outcome: Progressing   Problem: Respiratory: Goal: Ability to maintain adequate ventilation will improve Outcome: Progressing

## 2020-02-14 NOTE — Progress Notes (Signed)
Shift summary:  - Improving FiO2 requirement. - Remains intubated, sedated, and on pressors.

## 2020-02-14 NOTE — Progress Notes (Signed)
SUBJECTIVE: Patient remains intubated   Vitals:   02/14/20 0745 02/14/20 0800 02/14/20 0900 02/14/20 1000  BP:  (!) 93/57 (!) 92/55 (!) 95/56  Pulse:  95 (!) 113 99  Resp:  (!) 24 (!) 34 (!) 28  Temp:      TempSrc:      SpO2: 90% 90% 91% 91%  Weight:      Height:        Intake/Output Summary (Last 24 hours) at 02/14/2020 1113 Last data filed at 02/14/2020 1000 Gross per 24 hour  Intake 5157.79 ml  Output 1165 ml  Net 3992.79 ml    LABS: Basic Metabolic Panel: Recent Labs    02/11/20 1908 02/11/20 2352 02/12/20 0544 02/13/20 0403 02/14/20 0231  NA 143  --    < > 147* 147*  K 4.4  --    < > 4.4 4.1  CL 99  --    < > 104 104  CO2 33*  --    < > 34* 33*  GLUCOSE 191*  --    < > 181* 164*  BUN 21  --    < > 32* 33*  CREATININE 0.52*  --    < > 0.55* 0.50*  CALCIUM 8.6*  --    < > 8.7* 8.3*  MG  --  2.5*  --  2.7*  --   PHOS 3.6  --   --   --   --    < > = values in this interval not displayed.   Liver Function Tests: Recent Labs    02/11/20 1908  ALBUMIN 3.0*   No results for input(s): LIPASE, AMYLASE in the last 72 hours. CBC: Recent Labs    02/11/20 1908 02/12/20 0544 02/13/20 0403 02/14/20 0231  WBC 28.0*   < > 20.5* 20.6*  NEUTROABS 24.7*  --   --   --   HGB 12.6*   < > 10.7* 10.4*  HCT 40.8   < > 34.0* 34.7*  MCV 93.4   < > 91.2 95.3  PLT 487*   < > 366 401*   < > = values in this interval not displayed.   Cardiac Enzymes: No results for input(s): CKTOTAL, CKMB, CKMBINDEX, TROPONINI in the last 72 hours. BNP: Invalid input(s): POCBNP D-Dimer: No results for input(s): DDIMER in the last 72 hours. Hemoglobin A1C: Recent Labs    02/12/20 0544  HGBA1C 5.9*   Fasting Lipid Panel: Recent Labs    02/14/20 0231  TRIG 101   Thyroid Function Tests: No results for input(s): TSH, T4TOTAL, T3FREE, THYROIDAB in the last 72 hours.  Invalid input(s): FREET3 Anemia Panel: No results for input(s): VITAMINB12, FOLATE, FERRITIN, TIBC, IRON,  RETICCTPCT in the last 72 hours.   PHYSICAL EXAM General: Well developed, well nourished, in no acute distress HEENT:  Normocephalic and atramatic Neck:  No JVD.  Lungs: Clear bilaterally to auscultation and percussion. Heart: HRRR . Normal S1 and S2 without gallops or murmurs.  Abdomen: Bowel sounds are positive, abdomen soft and non-tender  Msk:  Back normal, normal gait. Normal strength and tone for age. Extremities: No clubbing, cyanosis or edema.   Neuro: Alert and oriented X 3. Psych:  Good affect, responds appropriately  TELEMETRY: A. fib with rapid ventricular response rate.  ASSESSMENT AND PLAN: Atrial fibrillation with rapid ventricular response rate and pulmonary fibrosis respiratory failure.  Continue digoxin IV 0.25 daily.  Principal Problem:   Acute on chronic respiratory failure with hypoxia (HCC) Active  Problems:   Gastroesophageal reflux disease   CAP (community acquired pneumonia)   Sepsis (HCC)   Pulmonary fibrosis (HCC)   Atrial fibrillation with RVR (HCC)    Felicidad Sugarman A, MD, [facility] 02/14/2020 11:13 AM

## 2020-02-14 NOTE — Consult Note (Signed)
ANTICOAGULATION CONSULT NOTE - Initial Consult  Pharmacy Consult for Heparin infusion Indication: atrial fibrillation  No Known Allergies  Patient Measurements: Height: 5\' 10"  (177.8 cm) Weight:  (patient could not be weight at the time. nurse notified.) IBW/kg (Calculated) : 73 Heparin Dosing Weight: 80.5 kg  Vital Signs: Temp: 98.7 F (37.1 C) (09/25 2000) Temp Source: Oral (09/25 2000) BP: 100/72 (09/26 0300) Pulse Rate: 95 (09/26 0300)  Labs: Recent Labs    02/12/20 0544 02/12/20 0544 02/13/20 0403 02/14/20 0231  HGB 10.2*   < > 10.7* 10.4*  HCT 32.5*  --  34.0* 34.7*  PLT 352  --  366 401*  HEPARINUNFRC 0.30  --  0.42 0.35  CREATININE 0.55*  --  0.55* 0.50*   < > = values in this interval not displayed.    Estimated Creatinine Clearance: 92.5 mL/min (A) (by C-G formula based on SCr of 0.5 mg/dL (L)).   Medical History: Past Medical History:  Diagnosis Date  . Patient denies medical problems   . Pulmonary fibrosis (HCC)     Medications:  Per chart review, no PTA anticoagulation  Assessment: 69 year old male with new onset Afib started on diltiazem drip and digoxin with PMH significant for pulmonary fibrosis. Patient was originally going to receive anticoagulation with Apixaban, however is unable to take medications by mouth due to inability to remove BiPAP. Pharmacy has been consulted for heparin dosing and monitoring.  Hgb 11.6, Plt slightly elevated 412  Goal of Therapy:  Heparin level 0.3-0.7 units/ml Monitor platelets by anticoagulation protocol: Yes   Plan:  Give 4000 units bolus x 1 Start heparin infusion at 1150 units/hr Check anti-Xa level in 6 hours and daily while on heparin Continue to monitor H&H and platelets  9/23:  HL @ 2352 = 0.39 Will continue pt on current rate and recheck HL in 6 hrs on 9/24 @ 0600.   9/24:  HL @ 0544 = 03 Will continue pt on current rate and recheck HL on 9/25 with AM labs.   9/25: HL @ 0403 = 0.42 Will  continue pt on current rate and recheck HL on 9/26 with AM labs.   9/26: HL @ 0231 = 0.35 Will continue pt on current rate and recheck HL on 9/27 with AM labs.   10/27, PharmD Pharmacy Resident  02/14/2020 5:26 AM

## 2020-02-14 NOTE — Progress Notes (Addendum)
Name: Donald Sellers MRN: [id number] DOB: [birth year]    ADMISSION DATE:  02/06/2020 CONSULTATION DATE:  02/11/2020  REFERRING MD :  Dr. Arbutus Ped  CHIEF COMPLAINT:  Acute Respiratory Distress  BRIEF PATIENT DESCRIPTION:  69 y.o. Male admitted with Acute on Chronic Hypoxic Respiratory Failure in the setting of Community Acquired Pneumonia and Acute Exacerbation of Idiopathic Pulmonary Fibrosis.  Required emergent intubation on 02/11/20.  SIGNIFICANT EVENTS  02/09/20- Patient in [place] during evaluation this am.  He was placed on BIPAP and is on cardizem gtt.  He may need closer monitoring since he is now on drip and NIV.  Will attempt to wean off both today and if unable to will advance care to SDU.  02/10/20- patient is improved, he has + bacterial respiratory culture with strep species.   His AFRVR is also improved and he has been seen by cariology.  He shares dyspnea and SOB is somewhat improved and he is off BIPAP during my evaluation.  02/11/20: Acute Respiratory Distress requiring emergent intubation  STUDIES:  9/20: CXR>>Widespread fibrosis, essentially stable. It is difficult to exclude a degree of underlying emphysematous change. The appearance is essentially stable compared to prior study. No consolidation evident. Heart upper normal in size. 9/20: CTA Chest>>1. No evidence of pulmonary embolism. 2. Progression of extensive bilateral interstitial and consolidative opacities, even compared to recent chest CT performed 12/11/2019, worrisome for progressive fibrotic lung disease, nonspecific though suggestive of UIP given probable honeycombing within the right lower lobe. 3. Improved aeration of previously noted consolidative opacities within the left upper lobe suggestive of resolved infection and/or inflammation. 4. Redemonstrated mediastinal and bilateral hilar lymphadenopathy, nonspecific though presumably reactive in etiology.  CULTURES: SARS-CoV-2 PCR 9/20>>  negative Respiratory viral panel 9/20>> negative Blood culture x2 9/20>> no growth to date Urine 9/21>> no growth Sputum culture 9/21>> normal respiratory flora (no staph aureus or Pseudomonas) Sputum culture 9/23>>  ANTIBIOTICS: Zosyn  Doxycycline  PATIENT PROFILE:   Donald Sellers is a 69 year old male with a past medical history significant for pulmonary fibrosis on 4 L/min home O2 who presented to [facility] ED on 02/12/2020 due to worsening shortness of breath from his baseline with increased O2 requirements and associated productive cough.  Of note he was recently diagnosed with pulmonary fibrosis in July 20 1:21 year of progressive shortness of breath.  Upon presentation to the ED he was noted to be hypoxic with O2 sats in the low 80s on his home 4 L of O2, which he was placed on nonrebreather mask.  Initial work-up in the ED showed leukocytosis with left shift, normal lactic acid.  CTA chest was negative for PE, did show progression of extensive bilateral interstitial and consolidative opacities which were worrisome for progressive fibrotic lung disease and questionable pneumonia.    He was admitted by the hospitalist for further work-up and treatment of acute on chronic hypoxic respiratory failure in the setting of community-acquired pneumonia and acute exacerbation of pulmonary fibrosis.  Pulmonary was consulted.  On 02/11/2020 he developed acute respiratory distress requiring transfer to ICU and subsequent intubation.  PCCM is consulted for further management.  No Known Allergies   Current inpatient medications reviewed.  REVIEW OF SYSTEMS:   Unable to assess due to critical illness, intubation and sedation  SUBJECTIVE:  Unable to assess due to critical illness, intubation and sedation  VITAL SIGNS: Temp:  [97.8 F (36.6 C)-98.5 F (36.9 C)] 98.3 F (36.8 C) (09/26 1600) Pulse Rate:  [70-118] 101 (09/26 1900) Resp:  [  18-34] 34 (09/26 1900) BP: (81-122)/(53-85) 93/59 (09/26  1900) SpO2:  [87 %-94 %] 91 % (09/26 1900) FiO2 (%):  [50 %] 50 % (09/26 2030)   Ventilator parameters reviewed with RT.  PHYSICAL EXAMINATION: General:  Critically ill appearing male, laying in bed, intubated and sedated, synchronous with the ventilator. Neuro:  Heavily sedated, pupils PERRLA HEENT:  Atraumatic, normocephalic, neck supple, no JVD Cardiovascular:  Tachycardia, irregularly irregular rhythm, no M/R/G, 1+ distal pulses Lungs:  Coarse breath sounds to auscultation bilaterally, crackles at bases, vent assisted, synchronous  Abdomen:  Soft, nondistended,BS+ x4 Musculoskeletal:  Normal bulk and tone, no deformities, no edema Skin:  Warm and dry. No obvious rashes, lesions, or ulcerations   Recent Labs  Lab 02/12/20 0544 02/13/20 0403 02/14/20 0231  NA 146* 147* 147*  K 4.5 4.4 4.1  CL 103 104 104  CO2 35* 34* 33*  BUN 23 32* 33*  CREATININE 0.55* 0.55* 0.50*  GLUCOSE 199* 181* 164*   Recent Labs  Lab 02/12/20 0544 02/13/20 0403 02/14/20 0231  HGB 10.2* 10.7* 10.4*  HCT 32.5* 34.0* 34.7*  WBC 17.5* 20.5* 20.6*  PLT 352 366 401*   DG Chest Port 1 View  Result Date: 02/13/2020 CLINICAL DATA:  History of pulmonary fibrosis with shortness of breath EXAM: PORTABLE CHEST 1 VIEW COMPARISON:  02/12/2020 FINDINGS: Cardiac shadow is stable. Endotracheal tube, gastric catheter and left-sided PICC line are noted in satisfactory position. Diffuse airspace opacity is again identified and stable. Persistent subcutaneous emphysema is noted. The degree of pneumomediastinum has decreased somewhat. No pneumothorax is noted. IMPRESSION: Tubes and lines as described above. Stable airspace opacities when compared with the prior exam. Electronically Signed   By: Inez Catalina M.D.   On: 02/13/2020 12:19    ASSESSMENT / PLAN:  Acute on Chronic Hypoxic Respiratory Failure secondary to Acute Exacerbation of Idiopathic Pulmonary Fibrosis & Community Acquired Pneumonia Hx: Baseline 4-5 L  O2 requirement -Continue full Vent support -Wean FiO2 & PEEP as tolerated to maintain O2 sats >92% -Ventilator parameters reviewed with RT -Follow intermittent CXR & ABG as needed -[place] VAP Protocol -Spontaneous breathing trials when respiratory parameters met -Continue Steroids -Continue Doxycycline & Zosyn -PRN Bronchodilators   Severe Sepsis secondary to Community Acquired Pneumonia -Monitor fever curve -Trend WBC's & Procalcitonin -Follow cultures as above -Continue Zosyn & Doxycycline   Atrial Fibrillation w/ RVR -Continuous cardiac monitoring -Maintain MAP >65 -Cardiology following, appreciate input ~ follow up recommendations -Continue Cardizem & Heparin gtts -Avoiding amiodarone due to pulmonary fibrosis issues   Hyperglycemia -CBG's -SSI -Follow ICU Hypo/Hyperglycemia protocol   GERD -NPO -IV Protonix BID   Sedation needs in setting of mechanical ventilation -Maintain RASS goal 0 to -1 -Fentanyl and Propofol gtts as needed to maintain RASS goal -Avoid sedating meds as able -Daily wake up assessment -Provide supportive care    Pt is critically ill, prognosis is guarded.  He is at high risk for cardiac arrest and death.  He has significantly advanced pulmonary fibrosis overall prognosis long-term is poor.  BEST PRACTICES: DISPOSITION: ICU GOALS OF CARE: Full Code VTE PROPHYLAXIS/ANTICOAGULATION: Heparin gtt SUP: IV Protonix CONSULS: Cardiology, Dr. Humphrey Rolls UPDATES: Update as needed   Critical care time 35 minutes.  Renold Don, MD [facility]  02/14/2020, 9:55 PM   *This note was dictated using voice recognition software/Dragon.  Despite best efforts to proofread, errors can occur which can change the meaning.  Any change was purely unintentional.

## 2020-02-15 LAB — CBC
HCT: 31.9 % — ABNORMAL LOW (ref 39.0–52.0)
Hemoglobin: 9.6 g/dL — ABNORMAL LOW (ref 13.0–17.0)
MCH: 28.8 pg (ref 26.0–34.0)
MCHC: 30.1 g/dL (ref 30.0–36.0)
MCV: 95.8 fL (ref 80.0–100.0)
Platelets: 312 10*3/uL (ref 150–400)
RBC: 3.33 MIL/uL — ABNORMAL LOW (ref 4.22–5.81)
RDW: 14.1 % (ref 11.5–15.5)
WBC: 17.3 10*3/uL — ABNORMAL HIGH (ref 4.0–10.5)
nRBC: 0.2 % (ref 0.0–0.2)

## 2020-02-15 LAB — ECHOCARDIOGRAM COMPLETE
Area-P 1/2: 4.31 cm2
Height: 70 in
S' Lateral: 3.25 cm
Weight: 2838.4 oz

## 2020-02-15 LAB — GLUCOSE, CAPILLARY
Glucose-Capillary: 136 mg/dL — ABNORMAL HIGH (ref 70–99)
Glucose-Capillary: 144 mg/dL — ABNORMAL HIGH (ref 70–99)
Glucose-Capillary: 152 mg/dL — ABNORMAL HIGH (ref 70–99)
Glucose-Capillary: 152 mg/dL — ABNORMAL HIGH (ref 70–99)
Glucose-Capillary: 155 mg/dL — ABNORMAL HIGH (ref 70–99)
Glucose-Capillary: 156 mg/dL — ABNORMAL HIGH (ref 70–99)

## 2020-02-15 LAB — HEPARIN LEVEL (UNFRACTIONATED): Heparin Unfractionated: 0.58 IU/mL (ref 0.30–0.70)

## 2020-02-15 MED ORDER — FENTANYL 2500MCG IN NS 250ML (10MCG/ML) PREMIX INFUSION
0.0000 ug/h | INTRAVENOUS | Status: DC
  Administered 2020-02-15: 200 ug/h via INTRAVENOUS
  Administered 2020-02-16 (×3): 300 ug/h via INTRAVENOUS
  Administered 2020-02-17: 400 ug/h via INTRAVENOUS
  Administered 2020-02-17 – 2020-02-20 (×10): 300 ug/h via INTRAVENOUS
  Administered 2020-02-20 – 2020-02-21 (×3): 200 ug/h via INTRAVENOUS
  Administered 2020-02-22: 400 ug/h via INTRAVENOUS
  Administered 2020-02-22: 375 ug/h via INTRAVENOUS
  Filled 2020-02-15 (×19): qty 250

## 2020-02-15 MED ORDER — SODIUM CHLORIDE 0.9 % IV SOLN
0.0000 ug/h | INTRAVENOUS | Status: DC
  Administered 2020-02-15: 150 ug/h via INTRAVENOUS
  Filled 2020-02-15: qty 50

## 2020-02-15 NOTE — Consult Note (Signed)
ANTICOAGULATION CONSULT NOTE - Initial Consult  Pharmacy Consult for Heparin infusion Indication: atrial fibrillation  No Known Allergies  Patient Measurements: Height: 5\' 10"  (177.8 cm) Weight:  (patient could not be weight at the time. nurse notified.) IBW/kg (Calculated) : 73 Heparin Dosing Weight: 80.5 kg  Vital Signs: BP: 129/82 (09/27 0500) Pulse Rate: 80 (09/27 0500)  Labs: Recent Labs    02/13/20 0403 02/13/20 0403 02/14/20 0231 02/15/20 0345  HGB 10.7*   < > 10.4* 9.6*  HCT 34.0*  --  34.7* 31.9*  PLT 366  --  401* 312  HEPARINUNFRC 0.42  --  0.35 0.58  CREATININE 0.55*  --  0.50*  --    < > = values in this interval not displayed.    Estimated Creatinine Clearance: 92.5 mL/min (A) (by C-G formula based on SCr of 0.5 mg/dL (L)).   Medical History: Past Medical History:  Diagnosis Date  . Patient denies medical problems   . Pulmonary fibrosis (HCC)     Medications:  Per chart review, no PTA anticoagulation  Assessment: 69 year old male with new onset Afib started on diltiazem drip and digoxin with PMH significant for pulmonary fibrosis. Patient was originally going to receive anticoagulation with Apixaban, however is unable to take medications by mouth due to inability to remove BiPAP. Pharmacy has been consulted for heparin dosing and monitoring.  Hgb 11.6, Plt slightly elevated 412  Goal of Therapy:  Heparin level 0.3-0.7 units/ml Monitor platelets by anticoagulation protocol: Yes   Plan:  Give 4000 units bolus x 1 Start heparin infusion at 1150 units/hr Check anti-Xa level in 6 hours and daily while on heparin Continue to monitor H&H and platelets  9/23:  HL @ 2352 = 0.39 Will continue pt on current rate and recheck HL in 6 hrs on 9/24 @ 0600.   9/24:  HL @ 0544 = 03 Will continue pt on current rate and recheck HL on 9/25 with AM labs.   9/25: HL @ 0403 = 0.42 Will continue pt on current rate and recheck HL on 9/26 with AM labs.   9/26:  HL @ 0231 = 0.35 Will continue pt on current rate and recheck HL on 9/27 with AM labs.   9/27: HL @ 0345 = 0.5 Will continue pt on current rate and recheck HL on 9/28 with AM labs.   10/28, PharmD Pharmacy Resident  02/15/2020 5:57 AM

## 2020-02-15 NOTE — Plan of Care (Signed)
  Problem: Education: Goal: Knowledge of General Education information will improve Description: Including pain rating scale, medication(s)/side effects and non-pharmacologic comfort measures Outcome: Progressing   Problem: Activity: Goal: Risk for activity intolerance will decrease Outcome: Progressing   Problem: Pain Managment: Goal: General experience of comfort will improve Outcome: Progressing   Problem: Safety: Goal: Ability to remain free from injury will improve Outcome: Progressing   Problem: Skin Integrity: Goal: Risk for impaired skin integrity will decrease Outcome: Progressing   Problem: Fluid Volume: Goal: Hemodynamic stability will improve Outcome: Progressing   Problem: Respiratory: Goal: Ability to maintain adequate ventilation will improve Outcome: Progressing

## 2020-02-15 NOTE — Progress Notes (Signed)
SUBJECTIVE: Patient remains intubated   Vitals:   02/15/20 0803 02/15/20 0850 02/15/20 0900 02/15/20 1000  BP:  (!) 143/71 137/75 101/64  Pulse:  79 71 63  Resp:  (!) 30 (!) 34 (!) 34  Temp:      TempSrc:      SpO2: 91% 91% 95% 90%  Weight:      Height:        Intake/Output Summary (Last 24 hours) at 02/15/2020 1053 Last data filed at 02/15/2020 1003 Gross per 24 hour  Intake 4415.2 ml  Output 1370 ml  Net 3045.2 ml    LABS: Basic Metabolic Panel: Recent Labs    02/13/20 0403 02/14/20 0231  NA 147* 147*  K 4.4 4.1  CL 104 104  CO2 34* 33*  GLUCOSE 181* 164*  BUN 32* 33*  CREATININE 0.55* 0.50*  CALCIUM 8.7* 8.3*  MG 2.7*  --    Liver Function Tests: No results for input(s): AST, ALT, ALKPHOS, BILITOT, PROT, ALBUMIN in the last 72 hours. No results for input(s): LIPASE, AMYLASE in the last 72 hours. CBC: Recent Labs    02/14/20 0231 02/15/20 0345  WBC 20.6* 17.3*  HGB 10.4* 9.6*  HCT 34.7* 31.9*  MCV 95.3 95.8  PLT 401* 312   Cardiac Enzymes: No results for input(s): CKTOTAL, CKMB, CKMBINDEX, TROPONINI in the last 72 hours. BNP: Invalid input(s): POCBNP D-Dimer: No results for input(s): DDIMER in the last 72 hours. Hemoglobin A1C: No results for input(s): HGBA1C in the last 72 hours. Fasting Lipid Panel: Recent Labs    02/14/20 0231  TRIG 101   Thyroid Function Tests: No results for input(s): TSH, T4TOTAL, T3FREE, THYROIDAB in the last 72 hours.  Invalid input(s): FREET3 Anemia Panel: No results for input(s): VITAMINB12, FOLATE, FERRITIN, TIBC, IRON, RETICCTPCT in the last 72 hours.   PHYSICAL EXAM General: Well developed, well nourished, in no acute distress HEENT:  Normocephalic and atramatic Neck:  No JVD.  Lungs: Clear bilaterally to auscultation and percussion. Heart: HRRR . Normal S1 and S2 without gallops or murmurs.  Abdomen: Bowel sounds are positive, abdomen soft and non-tender  Msk:  Back normal, normal gait. Normal strength  and tone for age. Extremities: No clubbing, cyanosis or edema.   Neuro: Alert and oriented X 3. Psych:  Good affect, responds appropriately  TELEMETRY: Sinus bradycardia this morning  ASSESSMENT AND PLAN: Atrial fibrillation with rapid ventricular response rate on digoxin with with now converting to sinus rhythm.  With sinus bradycardia advise holding digoxin.  Principal Problem:   Acute on chronic respiratory failure with hypoxia (HCC) Active Problems:   Gastroesophageal reflux disease   CAP (community acquired pneumonia)   Sepsis (HCC)   Pulmonary fibrosis (HCC)   Atrial fibrillation with RVR (HCC)    Adrian Blackwater A, MD, [facility] 02/15/2020 10:53 AM

## 2020-02-15 NOTE — Progress Notes (Signed)
Name: Donald Sellers MRN: [id number] DOB: [birth year]    ADMISSION DATE:  02/06/2020 CONSULTATION DATE:  02/11/2020  REFERRING MD :  Dr. Denton Lank  CHIEF COMPLAINT:  Acute Respiratory Distress  BRIEF PATIENT DESCRIPTION:  69 y.o. Male admitted with Acute on Chronic Hypoxic Respiratory Failure in the setting of Community Acquired Pneumonia and Acute Exacerbation of Idiopathic Pulmonary Fibrosis.  Required emergent intubation on 02/11/20.  SIGNIFICANT EVENTS  02/09/20- Patient in AFrVR during evaluation this am.  He was placed on BIPAP and is on cardizem gtt.  He may need closer monitoring since he is now on drip and NIV.  Will attempt to wean off both today and if unable to will advance care to SDU.  02/10/20- patient is improved, he has + bacterial respiratory culture with strep species.   His AFRVR is also improved and he has been seen by cariology.  He shares dyspnea and SOB is somewhat improved and he is off BIPAP during my evaluation.  02/11/20: Acute Respiratory Distress requiring emergent intubation 9/24 remains intubated 02/15/20- Weaning FiO2 , will d/c doxy and zosyn as patient completed 5d already and resp cutlures negative on tracheal aspirate  STUDIES:  9/20: CXR>>Widespread fibrosis, essentially stable. It is difficult to exclude a degree of underlying emphysematous change. The appearance is essentially stable compared to prior study. No consolidation evident. Heart upper normal in size. 9/20: CTA Chest>>1. No evidence of pulmonary embolism. 2. Progression of extensive bilateral interstitial and consolidative opacities, even compared to recent chest CT performed 12/11/2019, worrisome for progressive fibrotic lung disease, nonspecific though suggestive of UIP given probable honeycombing within the right lower lobe. 3. Improved aeration of previously noted consolidative opacities within the left upper lobe suggestive of resolved infection and/or inflammation. 4. Redemonstrated  mediastinal and bilateral hilar lymphadenopathy, nonspecific though presumably reactive in etiology.  CULTURES: SARS-CoV-2 PCR 9/20>> negative Respiratory viral panel 9/20>> negative Blood culture x2 9/20>> no growth to date Urine 9/21>> no growth Sputum culture 9/21>> normal respiratory flora (no staph aureus or Pseudomonas) Sputum culture 9/23>>  ANTIBIOTICS: Zosyn  Doxycycline   CC follow up resp failure HPI End stage lung disease Severe resp failure Remains critically ill  Vent Mode: PCV FiO2 (%):  [40 %-50 %] 50 % Set Rate:  [34 bmp] 34 bmp PEEP:  [8 cmH20] 8 cmH20 Plateau Pressure:  [28 cmH20-32 cmH20] 32 cmH20  CBC    Component Value Date/Time   WBC 17.3 (H) 02/15/2020 0345   RBC 3.33 (L) 02/15/2020 0345   HGB 9.6 (L) 02/15/2020 0345   HCT 31.9 (L) 02/15/2020 0345   PLT 312 02/15/2020 0345   MCV 95.8 02/15/2020 0345   MCH 28.8 02/15/2020 0345   MCHC 30.1 02/15/2020 0345   RDW 14.1 02/15/2020 0345   LYMPHSABS 0.9 02/11/2020 1908   MONOABS 2.1 (H) 02/11/2020 1908   EOSABS 0.0 02/11/2020 1908   BASOSABS 0.1 02/11/2020 1908   BMP Latest Ref Rng & Units 02/14/2020 02/13/2020 02/12/2020  Glucose 70 - 99 mg/dL 786(V) 672(C) 947(S)  BUN 8 - 23 mg/dL 96(G) 83(M) 23  Creatinine 0.61 - 1.24 mg/dL 6.29(U) 7.65(Y) 6.50(P)  Sodium 135 - 145 mmol/L 147(H) 147(H) 146(H)  Potassium 3.5 - 5.1 mmol/L 4.1 4.4 4.5  Chloride 98 - 111 mmol/L 104 104 103  CO2 22 - 32 mmol/L 33(H) 34(H) 35(H)  Calcium 8.9 - 10.3 mg/dL 8.3(L) 8.7(L) 8.9      REVIEW OF SYSTEMS  PATIENT IS UNABLE TO PROVIDE COMPLETE REVIEW OF SYSTEM S DUE  TO SEVERE CRITICAL ILLNESS AND ENCEPHALOPATHY  VITAL SIGNS: Temp:  [97.2 F (36.2 C)-98.5 F (36.9 C)] 97.2 F (36.2 C) (09/27 0730) Pulse Rate:  [48-115] 78 (09/27 1100) Resp:  [21-34] 31 (09/27 1100) BP: (86-143)/(55-82) 110/67 (09/27 1100) SpO2:  [87 %-95 %] 91 % (09/27 1100) FiO2 (%):  [40 %-50 %] 50 % (09/27 0850)  PHYSICAL  EXAMINATION:  GENERAL:critically ill appearing, +resp distress HEAD: Normocephalic, atraumatic.  EYES: Pupils equal, round, reactive to light.  No scleral icterus.  MOUTH: Moist mucosal membrane. NECK: Supple. No thyromegaly. No nodules. No JVD.  PULMONARY: +rhonchi, +wheezing CARDIOVASCULAR: S1 and S2. Regular rate and rhythm. No murmurs, rubs, or gallops.  GASTROINTESTINAL: Soft, nontender, -distended. Positive bowel sounds.  MUSCULOSKELETAL: No swelling, clubbing, or edema.  NEUROLOGIC: obtunded SKIN:intact,warm,dry    Recent Labs  Lab 02/12/20 0544 02/13/20 0403 02/14/20 0231  NA 146* 147* 147*  K 4.5 4.4 4.1  CL 103 104 104  CO2 35* 34* 33*  BUN 23 32* 33*  CREATININE 0.55* 0.55* 0.50*  GLUCOSE 199* 181* 164*   Recent Labs  Lab 02/13/20 0403 02/14/20 0231 02/15/20 0345  HGB 10.7* 10.4* 9.6*  HCT 34.0* 34.7* 31.9*  WBC 20.5* 20.6* 17.3*  PLT 366 401* 312   ECHOCARDIOGRAM COMPLETE  Result Date: 02/15/2020    ECHOCARDIOGRAM REPORT   Patient Name:   Donald EvertsBRENTON F Sellers Date of Exam: 02/14/2020 Medical Rec #:  [id number]       Height:       70.0 in Accession #:    [id number]      Weight:       177.4 lb Date of Birth:  1951-04-26      BSA:          1.984 m Patient Age:    68 years        BP:           95/61 mmHg Patient Gender: M               HR:           93 bpm. Exam Location:  [facility] Procedure: 2D Echo, Cardiac Doppler, Color Doppler and Intracardiac            Opacification Agent Indications:     I48.91 Atrial Fibrillation  History:         Patient has prior history of Echocardiogram examinations, most                  recent 12/14/2019. Risk Factors:Former Smoker. Pulmonary                  fibrosis.  Sonographer:     Sedonia SmallNaTashia Rodgers-Jones Referring Phys:  Carmelina [id number] SHAUKAT A KHAN Diagnosing Phys: Adrian BlackwaterShaukat Khan MD  Sonographer Comments: Technically difficult study due to poor echo windows. IMPRESSIONS  1. Left ventricular ejection fraction, by estimation, is 60 to 65%. The left  ventricle has normal function. The left ventricle has no regional wall motion abnormalities. There is moderate concentric left ventricular hypertrophy. Left ventricular diastolic parameters are consistent with Grade I diastolic dysfunction (impaired relaxation).  2. Right ventricular systolic function is normal. The right ventricular size is normal.  3. Left atrial size was mildly dilated.  4. Right atrial size was mildly dilated.  5. The mitral valve is normal in structure. Trivial mitral valve regurgitation. No evidence of mitral stenosis. Moderate to severe mitral annular calcification.  6. The aortic valve is normal in structure. Aortic valve regurgitation is  not visualized. Mild aortic valve sclerosis is present, with no evidence of aortic valve stenosis.  7. The inferior vena cava is normal in size with greater than 50% respiratory variability, suggesting right atrial pressure of 3 mmHg. FINDINGS  Left Ventricle: Left ventricular ejection fraction, by estimation, is 60 to 65%. The left ventricle has normal function. The left ventricle has no regional wall motion abnormalities. Definity contrast agent was given IV to delineate the left ventricular  endocardial borders. The left ventricular internal cavity size was normal in size. There is moderate concentric left ventricular hypertrophy. Left ventricular diastolic parameters are consistent with Grade I diastolic dysfunction (impaired relaxation). Right Ventricle: The right ventricular size is normal. No increase in right ventricular wall thickness. Right ventricular systolic function is normal. Left Atrium: Left atrial size was mildly dilated. Right Atrium: Right atrial size was mildly dilated. Pericardium: There is no evidence of pericardial effusion. Mitral Valve: The mitral valve is normal in structure. Moderate to severe mitral annular calcification. Trivial mitral valve regurgitation. No evidence of mitral valve stenosis. Tricuspid Valve: The tricuspid  valve is normal in structure. Tricuspid valve regurgitation is trivial. No evidence of tricuspid stenosis. Aortic Valve: The aortic valve is normal in structure. Aortic valve regurgitation is not visualized. Mild aortic valve sclerosis is present, with no evidence of aortic valve stenosis. Pulmonic Valve: The pulmonic valve was normal in structure. Pulmonic valve regurgitation is not visualized. No evidence of pulmonic stenosis. Aorta: The aortic root is normal in size and structure. Venous: The inferior vena cava is normal in size with greater than 50% respiratory variability, suggesting right atrial pressure of 3 mmHg. IAS/Shunts: No atrial level shunt detected by color flow Doppler.  LEFT VENTRICLE PLAX 2D LVIDd:         4.61 cm Diastology LVIDs:         3.25 cm LV e' medial:    9.70 cm/s LV PW:         0.77 cm LV E/e' medial:  8.2 LV IVS:        0.77 cm LV e' lateral:   9.88 cm/s                        LV E/e' lateral: 8.1  RIGHT VENTRICLE             IVC RV S prime:     16.40 cm/s  IVC diam: 2.42 cm TAPSE (M-mode): 0.8 cm LEFT ATRIUM             Index       RIGHT ATRIUM           Index LA diam:        4.30 cm 2.17 cm/m  RA Area:     13.50 cm LA Vol (A2C):   59.7 ml 30.10 ml/m RA Volume:   36.40 ml  18.35 ml/m LA Vol (A4C):   62.4 ml 31.46 ml/m LA Biplane Vol: 61.2 ml 30.85 ml/m   AORTA Ao Root diam: 3.90 cm MITRAL VALVE               TRICUSPID VALVE MV Area (PHT): 4.31 cm    TR Peak grad:   44.1 mmHg MV Decel Time: 176 msec    TR Vmax:        332.00 cm/s MV E velocity: 79.70 cm/s MV A velocity: 61.70 cm/s MV E/A ratio:  1.29 Adrian Blackwater MD Electronically signed by Adrian Blackwater MD Signature Date/Time: 02/15/2020/9:01:16  AM    Final     ASSESSMENT / PLAN:  Acute exacerbation of Idiopathic pulmonary fibosis   - there is right upper lobe consolidation suggestive of acute infectious cause of current exacerbation-s/p CAP treatment with zosyn and vanco    - will obtain serology for ANA comprehensive,  cryptococcal antigen, aspergillus ab, fungitell   - Respiratory viral panel    - sputum bacterial cultures   - procalcitonin trend   - MRSA PCR   - empiric IV dose steroids -increased to solumedrol 80q8h  -patient worked up in clinic and has antifibrotic therapy done-esbriet approval in progress   Atrial fibrillation with Rapid ventricular response  - patient with HR> 150 this am  - on cardizem gtt   - BP bordeline low MAP 80s  - s/p cardio evaluation - appreciate input - follow recommendations - cardizem and digoxin    GERD   -this should be very tightly controlled while in acute exacerbation of IPF - will start protonix 40 bidGI GI PROPHYLAXIS as indicated  NUTRITIONAL STATUS DIET-->TF's as tolerated Constipation protocol as indicated    NEUROLOGY Acute toxic metabolic encephalopathy, need for sedation Goal RASS -2 to -3    Critical Care Time devoted to patient care services described in this note is 32 minutes.   Overall, patient is critically ill, prognosis is guarded.  Patient with Multiorgan failure and at high risk for cardiac arrest and death.    I anticipate prolonged ICU LOS, recommend DNR status/palliative care consultation   Vida Rigger, M.D.  Pulmonary & Critical Care Medicine  [facility]

## 2020-02-15 NOTE — Progress Notes (Signed)
Shift summary:  - Failed WUA this AM due to hypoxia.

## 2020-02-16 LAB — BLOOD GAS, VENOUS
Acid-Base Excess: 5.6 mmol/L — ABNORMAL HIGH (ref 0.0–2.0)
Bicarbonate: 31.6 mmol/L — ABNORMAL HIGH (ref 20.0–28.0)
FIO2: 1
O2 Saturation: 34.7 %
Patient temperature: 37
pCO2, Ven: 51 mmHg (ref 44.0–60.0)
pH, Ven: 7.4 (ref 7.250–7.430)

## 2020-02-16 LAB — GLUCOSE, CAPILLARY
Glucose-Capillary: 168 mg/dL — ABNORMAL HIGH (ref 70–99)
Glucose-Capillary: 156 mg/dL — ABNORMAL HIGH (ref 70–99)
Glucose-Capillary: 118 mg/dL — ABNORMAL HIGH (ref 70–99)
Glucose-Capillary: 151 mg/dL — ABNORMAL HIGH (ref 70–99)
Glucose-Capillary: 155 mg/dL — ABNORMAL HIGH (ref 70–99)

## 2020-02-16 LAB — CBC
HCT: 32.9 % — ABNORMAL LOW (ref 39.0–52.0)
Hemoglobin: 10.2 g/dL — ABNORMAL LOW (ref 13.0–17.0)
MCH: 29.6 pg (ref 26.0–34.0)
MCHC: 31 g/dL (ref 30.0–36.0)
MCV: 95.4 fL (ref 80.0–100.0)
Platelets: 301 10*3/uL (ref 150–400)
RBC: 3.45 MIL/uL — ABNORMAL LOW (ref 4.22–5.81)
RDW: 14.5 % (ref 11.5–15.5)
WBC: 22 10*3/uL — ABNORMAL HIGH (ref 4.0–10.5)
nRBC: 0.1 % (ref 0.0–0.2)

## 2020-02-16 LAB — HEPARIN LEVEL (UNFRACTIONATED): Heparin Unfractionated: 1.24 IU/mL — ABNORMAL HIGH (ref 0.30–0.70)

## 2020-02-16 MED ORDER — APIXABAN 5 MG PO TABS
5.0000 mg | ORAL_TABLET | Freq: Two times a day (BID) | ORAL | Status: DC
  Administered 2020-02-16 – 2020-02-22 (×12): 5 mg
  Filled 2020-02-16: qty 1
  Filled 2020-02-16: qty 2
  Filled 2020-02-16 (×10): qty 1

## 2020-02-16 MED ORDER — APIXABAN 5 MG PO TABS
5.0000 mg | ORAL_TABLET | Freq: Two times a day (BID) | ORAL | Status: DC
  Administered 2020-02-16: 5 mg via ORAL
  Filled 2020-02-16: qty 1

## 2020-02-16 NOTE — Progress Notes (Signed)
SUBJECTIVE: Patient is intubated and sedated.  No acute events overnight.   Vitals:   02/16/20 0200 02/16/20 0300 02/16/20 0500 02/16/20 0600  BP: 112/64 126/74 101/65 (!) 96/59  Pulse: 74 85 79 72  Resp: (!) 27 (!) 27 (!) 29 (!) 28  Temp:      TempSrc:      SpO2: 97% 97% 97% 97%  Weight:      Height:        Intake/Output Summary (Last 24 hours) at 02/16/2020 1001 Last data filed at 02/16/2020 0600 Gross per 24 hour  Intake 3086.41 ml  Output 1075 ml  Net 2011.41 ml    LABS: Basic Metabolic Panel: Recent Labs    02/14/20 0231  NA 147*  K 4.1  CL 104  CO2 33*  GLUCOSE 164*  BUN 33*  CREATININE 0.50*  CALCIUM 8.3*   Liver Function Tests: No results for input(s): AST, ALT, ALKPHOS, BILITOT, PROT, ALBUMIN in the last 72 hours. No results for input(s): LIPASE, AMYLASE in the last 72 hours. CBC: Recent Labs    02/15/20 0345 02/16/20 0817  WBC 17.3* 22.0*  HGB 9.6* 10.2*  HCT 31.9* 32.9*  MCV 95.8 95.4  PLT 312 301   Cardiac Enzymes: No results for input(s): CKTOTAL, CKMB, CKMBINDEX, TROPONINI in the last 72 hours. BNP: Invalid input(s): POCBNP D-Dimer: No results for input(s): DDIMER in the last 72 hours. Hemoglobin A1C: No results for input(s): HGBA1C in the last 72 hours. Fasting Lipid Panel: Recent Labs    02/14/20 0231  TRIG 101   Thyroid Function Tests: No results for input(s): TSH, T4TOTAL, T3FREE, THYROIDAB in the last 72 hours.  Invalid input(s): FREET3 Anemia Panel: No results for input(s): VITAMINB12, FOLATE, FERRITIN, TIBC, IRON, RETICCTPCT in the last 72 hours.   PHYSICAL EXAM General: Well developed, well nourished, in no acute distress HEENT:  Normocephalic and atramatic Neck:  No JVD.  Lungs: Rhonchorous Heart: HRRR . Normal S1 and S2 without gallops or murmurs.  Abdomen: Bowel sounds are positive, abdomen soft and non-tender  Msk:  Back normal, normal gait. Normal strength and tone for age. Extremities: No clubbing, cyanosis or  edema.   Neuro: Intubated and sedated Psych:  Good affect, responds appropriately  TELEMETRY: Normal sinus rhythm.  94/BPM  ASSESSMENT AND PLAN: Patient with a history of IPF was found to have sepsis and developed A. fib with RVR.  Patient has since required emergent intubation.  A. fib with RVR has since resolved with Cardizem infusion and digoxin. Patient has since converted to normal sinus. Patient continues to require vasopressor support and would recommend continuing to avoid norepinephrine as he has since converted to NSR.  Patient anticoagulated with heparin infusion.  We will continue to follow  Principal Problem:   Acute on chronic respiratory failure with hypoxia [facility]) Active Problems:   Gastroesophageal reflux disease   CAP (community acquired pneumonia)   Sepsis (HCC)   Pulmonary fibrosis (HCC)   Atrial fibrillation with RVR (HCC)    Maryelizabeth Kaufmann, NP-C 02/16/2020 10:01 AM

## 2020-02-16 NOTE — Progress Notes (Signed)
Name: Donald Sellers MRN: [id number] DOB: 06/27/1950    ADMISSION DATE:  Feb 21, 2020 CONSULTATION DATE:  02/11/2020  REFERRING MD :  Dr. Denton Lank  CHIEF COMPLAINT:  Acute Respiratory Distress  BRIEF PATIENT DESCRIPTION:  69 y.o. Male admitted with Acute on Chronic Hypoxic Respiratory Failure in the setting of Community Acquired Pneumonia and Acute Exacerbation of Idiopathic Pulmonary Fibrosis.  Required emergent intubation on 02/11/20.  SIGNIFICANT EVENTS  02/09/20- Patient in AFrVR during evaluation this am.  He was placed on BIPAP and is on cardizem gtt.  He may need closer monitoring since he is now on drip and NIV.  Will attempt to wean off both today and if unable to will advance care to SDU.  02/10/20- patient is improved, he has + bacterial respiratory culture with strep species.   His AFRVR is also improved and he has been seen by cariology.  He shares dyspnea and SOB is somewhat improved and he is off BIPAP during my evaluation.  02/11/20: Acute Respiratory Distress requiring emergent intubation 9/24 remains intubated 02/15/20- Weaning FiO2 , will d/c doxy and zosyn as patient completed 5d already and resp cutlures negative on tracheal aspirate 9/28- patient had respiratory distress during weaning trial today.  He is in SR now, ive dcd duOnebs and levophed to avoid recurrence of AFrVR. Plan to continue to wean down FiO2 and extubate when patient passing SBT.   STUDIES:  9/20: CXR>>Widespread fibrosis, essentially stable. It is difficult to exclude a degree of underlying emphysematous change. The appearance is essentially stable compared to prior study. No consolidation evident. Heart upper normal in size. 9/20: CTA Chest>>1. No evidence of pulmonary embolism. 2. Progression of extensive bilateral interstitial and consolidative opacities, even compared to recent chest CT performed 12/11/2019, worrisome for progressive fibrotic lung disease, nonspecific though suggestive of UIP given  probable honeycombing within the right lower lobe. 3. Improved aeration of previously noted consolidative opacities within the left upper lobe suggestive of resolved infection and/or inflammation. 4. Redemonstrated mediastinal and bilateral hilar lymphadenopathy, nonspecific though presumably reactive in etiology.  CULTURES: SARS-CoV-2 PCR 9/20>> negative Respiratory viral panel 9/20>> negative Blood culture x2 9/20>> no growth to date Urine 9/21>> no growth Sputum culture 9/21>> normal respiratory flora (no staph aureus or Pseudomonas) Sputum culture 9/23>>  ANTIBIOTICS: Zosyn  Doxycycline   CC follow up resp failure HPI End stage lung disease Severe resp failure Remains critically ill  Vent Mode: PCV FiO2 (%):  [50 %-70 %] 70 % Set Rate:  [34 bmp] 34 bmp Vt Set:  [440 mL] 440 mL PEEP:  [8 cmH20] 8 cmH20 Plateau Pressure:  [21 cmH20-26 cmH20] 21 cmH20  CBC    Component Value Date/Time   WBC 22.0 (H) 02/16/2020 0817   RBC 3.45 (L) 02/16/2020 0817   HGB 10.2 (L) 02/16/2020 0817   HCT 32.9 (L) 02/16/2020 0817   PLT 301 02/16/2020 0817   MCV 95.4 02/16/2020 0817   MCH 29.6 02/16/2020 0817   MCHC 31.0 02/16/2020 0817   RDW 14.5 02/16/2020 0817   LYMPHSABS 0.9 02/11/2020 1908   MONOABS 2.1 (H) 02/11/2020 1908   EOSABS 0.0 02/11/2020 1908   BASOSABS 0.1 02/11/2020 1908   BMP Latest Ref Rng & Units 02/14/2020 02/13/2020 02/12/2020  Glucose 70 - 99 mg/dL 409(W) 119(J) 478(G)  BUN 8 - 23 mg/dL 95(A) 21(H) 23  Creatinine 0.61 - 1.24 mg/dL 0.86(V) 7.84(O) 9.62(X)  Sodium 135 - 145 mmol/L 147(H) 147(H) 146(H)  Potassium 3.5 - 5.1 mmol/L 4.1 4.4 4.5  Chloride 98 - 111 mmol/L 104 104 103  CO2 22 - 32 mmol/L 33(H) 34(H) 35(H)  Calcium 8.9 - 10.3 mg/dL 8.3(L) 8.7(L) 8.9      REVIEW OF SYSTEMS  PATIENT IS UNABLE TO PROVIDE COMPLETE REVIEW OF SYSTEM S DUE TO SEVERE CRITICAL ILLNESS AND ENCEPHALOPATHY  VITAL SIGNS: Temp:  [98.2 F (36.8 C)-99.2 F (37.3 C)] 98.2 F  (36.8 C) (09/28 0800) Pulse Rate:  [62-96] 62 (09/28 1400) Resp:  [20-34] 20 (09/28 1400) BP: (85-126)/(56-89) 97/60 (09/28 1400) SpO2:  [87 %-98 %] 97 % (09/28 0600) FiO2 (%):  [50 %-70 %] 70 % (09/28 0816)  PHYSICAL EXAMINATION:  GENERAL:critically ill appearing, +resp distress HEAD: Normocephalic, atraumatic.  EYES: Pupils equal, round, reactive to light.  No scleral icterus.  MOUTH: Moist mucosal membrane. NECK: Supple. No thyromegaly. No nodules. No JVD.  PULMONARY: +rhonchi, +wheezing CARDIOVASCULAR: S1 and S2. Regular rate and rhythm. No murmurs, rubs, or gallops.  GASTROINTESTINAL: Soft, nontender, -distended. Positive bowel sounds.  MUSCULOSKELETAL: No swelling, clubbing, or edema.  NEUROLOGIC: obtunded SKIN:intact,warm,dry    Recent Labs  Lab 02/12/20 0544 02/13/20 0403 02/14/20 0231  NA 146* 147* 147*  K 4.5 4.4 4.1  CL 103 104 104  CO2 35* 34* 33*  BUN 23 32* 33*  CREATININE 0.55* 0.55* 0.50*  GLUCOSE 199* 181* 164*   Recent Labs  Lab 02/14/20 0231 02/15/20 0345 02/16/20 0817  HGB 10.4* 9.6* 10.2*  HCT 34.7* 31.9* 32.9*  WBC 20.6* 17.3* 22.0*  PLT 401* 312 301   ECHOCARDIOGRAM COMPLETE  Result Date: 02/15/2020    ECHOCARDIOGRAM REPORT   Patient Name:   ELAI VANWYK Date of Exam: 02/14/2020 Medical Rec #:  [id number]       Height:       70.0 in Accession #:    [id number]      Weight:       177.4 lb Date of Birth:  08-May-1951      BSA:          1.984 m Patient Age:    68 years        BP:           95/61 mmHg Patient Gender: M               HR:           93 bpm. Exam Location:  [facility] Procedure: 2D Echo, Cardiac Doppler, Color Doppler and Intracardiac            Opacification Agent Indications:     I48.91 Atrial Fibrillation  History:         Patient has prior history of Echocardiogram examinations, most                  recent 12/14/2019. Risk Factors:Former Smoker. Pulmonary                  fibrosis.  Sonographer:     Sedonia Small Rodgers-Jones Referring Phys:   Carmelina Dane Diagnosing Phys: Adrian Blackwater MD  Sonographer Comments: Technically difficult study due to poor echo windows. IMPRESSIONS  1. Left ventricular ejection fraction, by estimation, is 60 to 65%. The left ventricle has normal function. The left ventricle has no regional wall motion abnormalities. There is moderate concentric left ventricular hypertrophy. Left ventricular diastolic parameters are consistent with Grade I diastolic dysfunction (impaired relaxation).  2. Right ventricular systolic function is normal. The right ventricular size is normal.  3. Left atrial size was mildly  dilated.  4. Right atrial size was mildly dilated.  5. The mitral valve is normal in structure. Trivial mitral valve regurgitation. No evidence of mitral stenosis. Moderate to severe mitral annular calcification.  6. The aortic valve is normal in structure. Aortic valve regurgitation is not visualized. Mild aortic valve sclerosis is present, with no evidence of aortic valve stenosis.  7. The inferior vena cava is normal in size with greater than 50% respiratory variability, suggesting right atrial pressure of 3 mmHg. FINDINGS  Left Ventricle: Left ventricular ejection fraction, by estimation, is 60 to 65%. The left ventricle has normal function. The left ventricle has no regional wall motion abnormalities. Definity contrast agent was given IV to delineate the left ventricular  endocardial borders. The left ventricular internal cavity size was normal in size. There is moderate concentric left ventricular hypertrophy. Left ventricular diastolic parameters are consistent with Grade I diastolic dysfunction (impaired relaxation). Right Ventricle: The right ventricular size is normal. No increase in right ventricular wall thickness. Right ventricular systolic function is normal. Left Atrium: Left atrial size was mildly dilated. Right Atrium: Right atrial size was mildly dilated. Pericardium: There is no evidence of pericardial  effusion. Mitral Valve: The mitral valve is normal in structure. Moderate to severe mitral annular calcification. Trivial mitral valve regurgitation. No evidence of mitral valve stenosis. Tricuspid Valve: The tricuspid valve is normal in structure. Tricuspid valve regurgitation is trivial. No evidence of tricuspid stenosis. Aortic Valve: The aortic valve is normal in structure. Aortic valve regurgitation is not visualized. Mild aortic valve sclerosis is present, with no evidence of aortic valve stenosis. Pulmonic Valve: The pulmonic valve was normal in structure. Pulmonic valve regurgitation is not visualized. No evidence of pulmonic stenosis. Aorta: The aortic root is normal in size and structure. Venous: The inferior vena cava is normal in size with greater than 50% respiratory variability, suggesting right atrial pressure of 3 mmHg. IAS/Shunts: No atrial level shunt detected by color flow Doppler.  LEFT VENTRICLE PLAX 2D LVIDd:         4.61 cm Diastology LVIDs:         3.25 cm LV e' medial:    9.70 cm/s LV PW:         0.77 cm LV E/e' medial:  8.2 LV IVS:        0.77 cm LV e' lateral:   9.88 cm/s                        LV E/e' lateral: 8.1  RIGHT VENTRICLE             IVC RV S prime:     16.40 cm/s  IVC diam: 2.42 cm TAPSE (M-mode): 0.8 cm LEFT ATRIUM             Index       RIGHT ATRIUM           Index LA diam:        4.30 cm 2.17 cm/m  RA Area:     13.50 cm LA Vol (A2C):   59.7 ml 30.10 ml/m RA Volume:   36.40 ml  18.35 ml/m LA Vol (A4C):   62.4 ml 31.46 ml/m LA Biplane Vol: 61.2 ml 30.85 ml/m   AORTA Ao Root diam: 3.90 cm MITRAL VALVE               TRICUSPID VALVE MV Area (PHT): 4.31 cm    TR Peak grad:   44.1 mmHg  MV Decel Time: 176 msec    TR Vmax:        332.00 cm/s MV E velocity: 79.70 cm/s MV A velocity: 61.70 cm/s MV E/A ratio:  1.29 Adrian BlackwaterShaukat Khan MD Electronically signed by Adrian BlackwaterShaukat Khan MD Signature Date/Time: 02/15/2020/9:01:16 AM    Final     ASSESSMENT / PLAN:  Acute exacerbation of  Idiopathic pulmonary fibosis   - there is right upper lobe consolidation suggestive of acute infectious cause of current exacerbation-s/p CAP treatment with zosyn and vanco    - will obtain serology for ANA comprehensive, cryptococcal antigen, aspergillus ab, fungitell   - Respiratory viral panel    - sputum bacterial cultures   - procalcitonin trend   - MRSA PCR   - empiric IV dose steroids -increased to solumedrol 80q8h  -patient worked up in clinic and has antifibrotic therapy done-esbriet approval in progress   Atrial fibrillation with Rapid ventricular response  - patient with HR> 150 this am  - on cardizem gtt   - BP bordeline low MAP 80s  - s/p cardio evaluation - appreciate input - follow recommendations - cardizem and digoxin    GERD   -this should be very tightly controlled while in acute exacerbation of IPF - will start protonix 40 bidGI GI PROPHYLAXIS as indicated  NUTRITIONAL STATUS DIET-->TF's as tolerated Constipation protocol as indicated    NEUROLOGY Acute toxic metabolic encephalopathy, need for sedation Goal RASS -2 to -3    Critical Care Time devoted to patient care services described in this note is 32 minutes.   Overall, patient is critically ill, prognosis is guarded.  Patient with Multiorgan failure and at high risk for cardiac arrest and death.       Vida RiggerFuad Sion Thane, M.D.  Pulmonary & Critical Care Medicine  [facility]

## 2020-02-17 LAB — CBC WITH DIFFERENTIAL/PLATELET
Abs Immature Granulocytes: 1.66 10*3/uL — ABNORMAL HIGH (ref 0.00–0.07)
Basophils Absolute: 0.1 10*3/uL (ref 0.0–0.1)
Basophils Relative: 1 %
Eosinophils Absolute: 0 10*3/uL (ref 0.0–0.5)
Eosinophils Relative: 0 %
HCT: 33.4 % — ABNORMAL LOW (ref 39.0–52.0)
Hemoglobin: 9.9 g/dL — ABNORMAL LOW (ref 13.0–17.0)
Immature Granulocytes: 9 %
Lymphocytes Relative: 3 %
Lymphs Abs: 0.4 10*3/uL — ABNORMAL LOW (ref 0.7–4.0)
MCH: 28.9 pg (ref 26.0–34.0)
MCHC: 29.6 g/dL — ABNORMAL LOW (ref 30.0–36.0)
MCV: 97.7 fL (ref 80.0–100.0)
Monocytes Absolute: 1.2 10*3/uL — ABNORMAL HIGH (ref 0.1–1.0)
Monocytes Relative: 7 %
Neutro Abs: 14.5 10*3/uL — ABNORMAL HIGH (ref 1.7–7.7)
Neutrophils Relative %: 80 %
Platelets: 289 10*3/uL (ref 150–400)
RBC: 3.42 MIL/uL — ABNORMAL LOW (ref 4.22–5.81)
RDW: 14.5 % (ref 11.5–15.5)
Smear Review: NORMAL
WBC: 17.8 10*3/uL — ABNORMAL HIGH (ref 4.0–10.5)
nRBC: 0.2 % (ref 0.0–0.2)

## 2020-02-17 LAB — BASIC METABOLIC PANEL
Anion gap: 6 (ref 5–15)
BUN: 28 mg/dL — ABNORMAL HIGH (ref 8–23)
CO2: 35 mmol/L — ABNORMAL HIGH (ref 22–32)
Calcium: 8.3 mg/dL — ABNORMAL LOW (ref 8.9–10.3)
Chloride: 103 mmol/L (ref 98–111)
Creatinine, Ser: 0.34 mg/dL — ABNORMAL LOW (ref 0.61–1.24)
GFR calc Af Amer: >60 mL/min (ref >60)
GFR calc non Af Amer: >60 mL/min (ref >60)
Glucose, Bld: 155 mg/dL — ABNORMAL HIGH (ref 70–99)
Potassium: 4.9 mmol/L (ref 3.5–5.1)
Sodium: 144 mmol/L (ref 135–145)

## 2020-02-17 LAB — GLUCOSE, CAPILLARY
Glucose-Capillary: 120 mg/dL — ABNORMAL HIGH (ref 70–99)
Glucose-Capillary: 128 mg/dL — ABNORMAL HIGH (ref 70–99)
Glucose-Capillary: 137 mg/dL — ABNORMAL HIGH (ref 70–99)
Glucose-Capillary: 144 mg/dL — ABNORMAL HIGH (ref 70–99)
Glucose-Capillary: 145 mg/dL — ABNORMAL HIGH (ref 70–99)
Glucose-Capillary: 149 mg/dL — ABNORMAL HIGH (ref 70–99)
Glucose-Capillary: 159 mg/dL — ABNORMAL HIGH (ref 70–99)

## 2020-02-17 LAB — PHOSPHORUS: Phosphorus: 4.1 mg/dL (ref 2.5–4.6)

## 2020-02-17 LAB — MAGNESIUM: Magnesium: 2.6 mg/dL — ABNORMAL HIGH (ref 1.7–2.4)

## 2020-02-17 MED ORDER — PHENYLEPHRINE HCL-NACL 10-0.9 MG/250ML-% IV SOLN
0.0000 ug/min | INTRAVENOUS | Status: DC
  Administered 2020-02-17: 25 ug/min via INTRAVENOUS
  Administered 2020-02-17: 40 ug/min via INTRAVENOUS
  Administered 2020-02-17: 25 ug/min via INTRAVENOUS
  Administered 2020-02-18 (×3): 30 ug/min via INTRAVENOUS
  Administered 2020-02-18 – 2020-02-19 (×2): 40 ug/min via INTRAVENOUS
  Filled 2020-02-17 (×9): qty 250

## 2020-02-17 NOTE — Progress Notes (Signed)
SUBJECTIVE: Intubated and sedated. No acute events overnight.   Vitals:   02/17/20 1100 02/17/20 1200 02/17/20 1300 02/17/20 1400  BP: (!) 82/51 (!) 91/57 (!) 91/53 (!) 91/54  Pulse: (!) 57 (!) 52 (!) 52 (!) 56  Resp: (!) 34 (!) 34 (!) 34 (!) 34  Temp:  98.1 F (36.7 C)    TempSrc:  Oral    SpO2:      Weight:      Height:        Intake/Output Summary (Last 24 hours) at 02/17/2020 1502 Last data filed at 02/17/2020 1137 Gross per 24 hour  Intake 2952.37 ml  Output 2975 ml  Net -22.63 ml    LABS: Basic Metabolic Panel: Recent Labs    02/17/20 0445  NA 144  K 4.9  CL 103  CO2 35*  GLUCOSE 155*  BUN 28*  CREATININE 0.34*  CALCIUM 8.3*  MG 2.6*  PHOS 4.1   Liver Function Tests: No results for input(s): AST, ALT, ALKPHOS, BILITOT, PROT, ALBUMIN in the last 72 hours. No results for input(s): LIPASE, AMYLASE in the last 72 hours. CBC: Recent Labs    02/16/20 0817 02/17/20 0445  WBC 22.0* 17.8*  NEUTROABS  --  14.5*  HGB 10.2* 9.9*  HCT 32.9* 33.4*  MCV 95.4 97.7  PLT 301 289   Cardiac Enzymes: No results for input(s): CKTOTAL, CKMB, CKMBINDEX, TROPONINI in the last 72 hours. BNP: Invalid input(s): POCBNP D-Dimer: No results for input(s): DDIMER in the last 72 hours. Hemoglobin A1C: No results for input(s): HGBA1C in the last 72 hours. Fasting Lipid Panel: No results for input(s): CHOL, HDL, LDLCALC, TRIG, CHOLHDL, LDLDIRECT in the last 72 hours. Thyroid Function Tests: No results for input(s): TSH, T4TOTAL, T3FREE, THYROIDAB in the last 72 hours.  Invalid input(s): FREET3 Anemia Panel: No results for input(s): VITAMINB12, FOLATE, FERRITIN, TIBC, IRON, RETICCTPCT in the last 72 hours.   PHYSICAL EXAM General: Well developed, well nourished, in no acute distress HEENT:  Normocephalic and atramatic Neck:  No JVD.  Lungs: Intubated and rhonchorous Heart: SB . Normal S1 and S2 without gallops or murmurs.  Abdomen: Bowel sounds are positive, abdomen soft  and non-tender  Msk:  Back normal, normal gait. Normal strength and tone for age. Extremities: No clubbing, cyanosis or edema.   Neuro: Sedated Psych:  Good affect, responds appropriately  TELEMETRY: SB. 55/bpm  ASSESSMENT AND PLAN: Patient with a history of IPF was found to have sepsis and developed A. fib with RVR.  Patient has since required emergent intubation.  A. fib with RVR has since resolved. Will hold on starting other antiarrhythmics as is currently bradycardic.Patient currently in Sinus Bradycardia. Please carefully titrate propofol or precedex if started. Patient continues to require vasopressor support and would recommend continuing to avoid norepinephrine as he has since converted to sinus rhythm.  Patient now off heparin and back on eliquis. We will continue to follow  Principal Problem:   Acute on chronic respiratory failure with hypoxia [facility]) Active Problems:   Gastroesophageal reflux disease   CAP (community acquired pneumonia)   Sepsis (HCC)   Pulmonary fibrosis (HCC)   Atrial fibrillation with RVR (HCC)    Donald Kaufmann, NP-C 02/17/2020 3:02 PM

## 2020-02-17 NOTE — Progress Notes (Signed)
Name: Donald Sellers MRN: [id number] DOB: Sep 20, 1950    ADMISSION DATE:  02/11/2020 CONSULTATION DATE:  02/11/2020  REFERRING MD :  Dr. Denton Lank  CHIEF COMPLAINT:  Acute Respiratory Distress  BRIEF PATIENT DESCRIPTION:  69 y.o. Male admitted with Acute on Chronic Hypoxic Respiratory Failure in the setting of Community Acquired Pneumonia and Acute Exacerbation of Idiopathic Pulmonary Fibrosis.  Required emergent intubation on 02/11/20.  SIGNIFICANT EVENTS  02/09/20- Patient in AFrVR during evaluation this am.  He was placed on BIPAP and is on cardizem gtt.  He may need closer monitoring since he is now on drip and NIV.  Will attempt to wean off both today and if unable to will advance care to SDU.  02/10/20- patient is improved, he has + bacterial respiratory culture with strep species.   His AFRVR is also improved and he has been seen by cariology.  He shares dyspnea and SOB is somewhat improved and he is off BIPAP during my evaluation.  02/11/20: Acute Respiratory Distress requiring emergent intubation 9/24 remains intubated 02/15/20- Weaning FiO2 , will d/c doxy and zosyn as patient completed 5d already and resp cutlures negative on tracheal aspirate 9/28- patient had respiratory distress during weaning trial today.  He is in SR now, ive dcd duOnebs and levophed to avoid recurrence of AFrVR. Plan to continue to wean down FiO2 and extubate when patient passing SBT.  02/17/20- patient is on mechanical ventilation, ive been able to meet with daughter at bedside. Patient was unable to pass SBT today went in to coughing with respiratory distress.   STUDIES:  9/20: CXR>>Widespread fibrosis, essentially stable. It is difficult to exclude a degree of underlying emphysematous change. The appearance is essentially stable compared to prior study. No consolidation evident. Heart upper normal in size. 9/20: CTA Chest>>1. No evidence of pulmonary embolism. 2. Progression of extensive bilateral  interstitial and consolidative opacities, even compared to recent chest CT performed 12/11/2019, worrisome for progressive fibrotic lung disease, nonspecific though suggestive of UIP given probable honeycombing within the right lower lobe. 3. Improved aeration of previously noted consolidative opacities within the left upper lobe suggestive of resolved infection and/or inflammation. 4. Redemonstrated mediastinal and bilateral hilar lymphadenopathy, nonspecific though presumably reactive in etiology.  CULTURES: SARS-CoV-2 PCR 9/20>> negative Respiratory viral panel 9/20>> negative Blood culture x2 9/20>> no growth to date Urine 9/21>> no growth Sputum culture 9/21>> normal respiratory flora (no staph aureus or Pseudomonas) Sputum culture 9/23>>  ANTIBIOTICS: Zosyn  Doxycycline   CC follow up resp failure HPI End stage lung disease Severe resp failure Remains critically ill  Vent Mode: PCV FiO2 (%):  [50 %-60 %] 60 % Set Rate:  [34 bmp] 34 bmp PEEP:  [8 cmH20] 8 cmH20 Plateau Pressure:  [23 cmH20] 23 cmH20  CBC    Component Value Date/Time   WBC 17.8 (H) 02/17/2020 0445   RBC 3.42 (L) 02/17/2020 0445   HGB 9.9 (L) 02/17/2020 0445   HCT 33.4 (L) 02/17/2020 0445   PLT 289 02/17/2020 0445   MCV 97.7 02/17/2020 0445   MCH 28.9 02/17/2020 0445   MCHC 29.6 (L) 02/17/2020 0445   RDW 14.5 02/17/2020 0445   LYMPHSABS 0.4 (L) 02/17/2020 0445   MONOABS 1.2 (H) 02/17/2020 0445   EOSABS 0.0 02/17/2020 0445   BASOSABS 0.1 02/17/2020 0445   BMP Latest Ref Rng & Units 02/17/2020 02/14/2020 02/13/2020  Glucose 70 - 99 mg/dL 295(J) 884(Z) 660(Y)  BUN 8 - 23 mg/dL 30(Z) 60(F) 09(N)  Creatinine 0.61 -  1.24 mg/dL 8.41(Y) 6.06(T) 0.16(W)  Sodium 135 - 145 mmol/L 144 147(H) 147(H)  Potassium 3.5 - 5.1 mmol/L 4.9 4.1 4.4  Chloride 98 - 111 mmol/L 103 104 104  CO2 22 - 32 mmol/L 35(H) 33(H) 34(H)  Calcium 8.9 - 10.3 mg/dL 8.3(L) 8.3(L) 8.7(L)      REVIEW OF SYSTEMS  PATIENT IS  UNABLE TO PROVIDE COMPLETE REVIEW OF SYSTEM S DUE TO SEVERE CRITICAL ILLNESS AND ENCEPHALOPATHY  VITAL SIGNS: Temp:  [98.1 F (36.7 C)-99.3 F (37.4 C)] 98.6 F (37 C) (09/29 1600) Pulse Rate:  [52-98] 56 (09/29 1400) Resp:  [30-34] 34 (09/29 1400) BP: (82-108)/(51-65) 91/54 (09/29 1400) SpO2:  [87 %-94 %] 93 % (09/29 0600) FiO2 (%):  [50 %-60 %] 60 % (09/29 1445)  PHYSICAL EXAMINATION:  GENERAL:critically ill appearing, +resp distress HEAD: Normocephalic, atraumatic.  EYES: Pupils equal, round, reactive to light.  No scleral icterus.  MOUTH: Moist mucosal membrane. NECK: Supple. No thyromegaly. No nodules. No JVD.  PULMONARY: +rhonchi, +wheezing CARDIOVASCULAR: S1 and S2. Regular rate and rhythm. No murmurs, rubs, or gallops.  GASTROINTESTINAL: Soft, nontender, -distended. Positive bowel sounds.  MUSCULOSKELETAL: No swelling, clubbing, or edema.  NEUROLOGIC: obtunded SKIN:intact,warm,dry    Recent Labs  Lab 02/13/20 0403 02/14/20 0231 02/17/20 0445  NA 147* 147* 144  K 4.4 4.1 4.9  CL 104 104 103  CO2 34* 33* 35*  BUN 32* 33* 28*  CREATININE 0.55* 0.50* 0.34*  GLUCOSE 181* 164* 155*   Recent Labs  Lab 02/15/20 0345 02/16/20 0817 02/17/20 0445  HGB 9.6* 10.2* 9.9*  HCT 31.9* 32.9* 33.4*  WBC 17.3* 22.0* 17.8*  PLT 312 301 289   No results found.  ASSESSMENT / PLAN:  Acute exacerbation of Idiopathic pulmonary fibosis   - there is right upper lobe consolidation suggestive of acute infectious cause of current exacerbation-s/p CAP treatment with zosyn and vanco    - will obtain serology for ANA comprehensive, cryptococcal antigen, aspergillus ab, fungitell   - Respiratory viral panel    - sputum bacterial cultures   - procalcitonin trend   - MRSA PCR   - empiric IV dose steroids -increased to solumedrol 80q8h  -patient worked up in clinic and has antifibrotic therapy done-esbriet approval in progress   Atrial fibrillation with Rapid ventricular  response  - patient with HR> 150 this am  - on cardizem gtt   - BP bordeline low MAP 80s  - s/p cardio evaluation - appreciate input - follow recommendations - cardizem and digoxin   GERD   -this should be very tightly controlled while in acute exacerbation of IPF - will start protonix 40 bidGI GI PROPHYLAXIS as indicated   NUTRITIONAL STATUS DIET-->TF's as tolerated Constipation protocol as indicated   NEUROLOGY Acute toxic metabolic encephalopathy, need for sedation Goal RASS -2 to -3    Critical Care Time devoted to patient care services described in this note is 32 minutes.   Overall, patient is critically ill, prognosis is guarded.  Patient with Multiorgan failure and at high risk for cardiac arrest and death.       Vida Rigger, M.D.  Pulmonary & Critical Care Medicine  [facility]

## 2020-02-17 NOTE — Progress Notes (Signed)
Sedation turned off and pts spo2 dropped to 81% during sbt. Pt agitated biting the tube. RT and RN at the bedside, RT put previous vent settings back in place and sedation turned back on.

## 2020-02-18 LAB — CBC WITH DIFFERENTIAL/PLATELET
Abs Immature Granulocytes: 1.49 10*3/uL — ABNORMAL HIGH (ref 0.00–0.07)
Basophils Absolute: 0.1 10*3/uL (ref 0.0–0.1)
Basophils Relative: 1 %
Eosinophils Absolute: 0 10*3/uL (ref 0.0–0.5)
Eosinophils Relative: 0 %
HCT: 35.3 % — ABNORMAL LOW (ref 39.0–52.0)
Hemoglobin: 10.4 g/dL — ABNORMAL LOW (ref 13.0–17.0)
Immature Granulocytes: 8 %
Lymphocytes Relative: 4 %
Lymphs Abs: 0.7 10*3/uL (ref 0.7–4.0)
MCH: 28.5 pg (ref 26.0–34.0)
MCHC: 29.5 g/dL — ABNORMAL LOW (ref 30.0–36.0)
MCV: 96.7 fL (ref 80.0–100.0)
Monocytes Absolute: 1.3 10*3/uL — ABNORMAL HIGH (ref 0.1–1.0)
Monocytes Relative: 7 %
Neutro Abs: 15 10*3/uL — ABNORMAL HIGH (ref 1.7–7.7)
Neutrophils Relative %: 80 %
Platelets: 259 10*3/uL (ref 150–400)
RBC: 3.65 MIL/uL — ABNORMAL LOW (ref 4.22–5.81)
RDW: 14.2 % (ref 11.5–15.5)
Smear Review: NORMAL
WBC: 18.5 10*3/uL — ABNORMAL HIGH (ref 4.0–10.5)
nRBC: 0.1 % (ref 0.0–0.2)

## 2020-02-18 LAB — BASIC METABOLIC PANEL
Anion gap: 10 (ref 5–15)
BUN: 27 mg/dL — ABNORMAL HIGH (ref 8–23)
CO2: 34 mmol/L — ABNORMAL HIGH (ref 22–32)
Calcium: 8.1 mg/dL — ABNORMAL LOW (ref 8.9–10.3)
Chloride: 99 mmol/L (ref 98–111)
Creatinine, Ser: 0.41 mg/dL — ABNORMAL LOW (ref 0.61–1.24)
GFR calc Af Amer: >60 mL/min (ref >60)
GFR calc non Af Amer: >60 mL/min (ref >60)
Glucose, Bld: 185 mg/dL — ABNORMAL HIGH (ref 70–99)
Potassium: 4.8 mmol/L (ref 3.5–5.1)
Sodium: 143 mmol/L (ref 135–145)

## 2020-02-18 LAB — GLUCOSE, CAPILLARY
Glucose-Capillary: 176 mg/dL — ABNORMAL HIGH (ref 70–99)
Glucose-Capillary: 146 mg/dL — ABNORMAL HIGH (ref 70–99)
Glucose-Capillary: 136 mg/dL — ABNORMAL HIGH (ref 70–99)
Glucose-Capillary: 161 mg/dL — ABNORMAL HIGH (ref 70–99)
Glucose-Capillary: 146 mg/dL — ABNORMAL HIGH (ref 70–99)

## 2020-02-18 LAB — PHOSPHORUS: Phosphorus: 3.6 mg/dL (ref 2.5–4.6)

## 2020-02-18 LAB — MAGNESIUM: Magnesium: 2.4 mg/dL (ref 1.7–2.4)

## 2020-02-18 LAB — TRIGLYCERIDES: Triglycerides: 89 mg/dL (ref <150)

## 2020-02-18 MED ORDER — METHYLPREDNISOLONE SODIUM SUCC 40 MG IJ SOLR
40.0000 mg | Freq: Three times a day (TID) | INTRAMUSCULAR | Status: DC
  Administered 2020-02-18 – 2020-02-22 (×13): 40 mg via INTRAVENOUS
  Filled 2020-02-18 (×13): qty 1

## 2020-02-18 MED ORDER — PROSOURCE TF PO LIQD
45.0000 mL | Freq: Two times a day (BID) | ORAL | Status: DC
  Administered 2020-02-18 – 2020-02-22 (×8): 45 mL
  Filled 2020-02-18 (×7): qty 45

## 2020-02-18 MED ORDER — VITAL HIGH PROTEIN PO LIQD
1000.0000 mL | ORAL | Status: DC
  Administered 2020-02-18 – 2020-02-21 (×6): 1000 mL

## 2020-02-18 NOTE — Progress Notes (Signed)
Name: Donald Sellers MRN: [id number] DOB: 11-16-1950    ADMISSION DATE:  2020-03-02 CONSULTATION DATE:  02/11/2020  REFERRING MD :  Dr. Denton Lank  CHIEF COMPLAINT:  Acute Respiratory Distress  BRIEF PATIENT DESCRIPTION:  69 y.o. Male admitted with Acute on Chronic Hypoxic Respiratory Failure in the setting of Community Acquired Pneumonia and Acute Exacerbation of Idiopathic Pulmonary Fibrosis.  Required emergent intubation on 02/11/20.  SIGNIFICANT EVENTS  02/09/20- Patient in AFrVR during evaluation this am.  He was placed on BIPAP and is on cardizem gtt.  He may need closer monitoring since he is now on drip and NIV.  Will attempt to wean off both today and if unable to will advance care to SDU.  02/10/20- patient is improved, he has + bacterial respiratory culture with strep species.   His AFRVR is also improved and he has been seen by cariology.  He shares dyspnea and SOB is somewhat improved and he is off BIPAP during my evaluation.  02/11/20: Acute Respiratory Distress requiring emergent intubation 9/24 remains intubated 02/15/20- Weaning FiO2 , will d/c doxy and zosyn as patient completed 5d already and resp cutlures negative on tracheal aspirate 9/28- patient had respiratory distress during weaning trial today.  He is in SR now, ive dcd duOnebs and levophed to avoid recurrence of AFrVR. Plan to continue to wean down FiO2 and extubate when patient passing SBT.  02/17/20- patient is on mechanical ventilation, ive been able to meet with daughter at bedside. Patient was unable to pass SBT today went in to coughing with respiratory distress.  02/18/20- patient is at 60% on FIo2,  I was able to meet with daughter at bedside. Decreased dose of steroids today to 40 TID.   STUDIES:  9/20: CXR>>Widespread fibrosis, essentially stable. It is difficult to exclude a degree of underlying emphysematous change. The appearance is essentially stable compared to prior study. No consolidation evident. Heart  upper normal in size. 9/20: CTA Chest>>1. No evidence of pulmonary embolism. 2. Progression of extensive bilateral interstitial and consolidative opacities, even compared to recent chest CT performed 12/11/2019, worrisome for progressive fibrotic lung disease, nonspecific though suggestive of UIP given probable honeycombing within the right lower lobe. 3. Improved aeration of previously noted consolidative opacities within the left upper lobe suggestive of resolved infection and/or inflammation. 4. Redemonstrated mediastinal and bilateral hilar lymphadenopathy, nonspecific though presumably reactive in etiology.  CULTURES: SARS-CoV-2 PCR 9/20>> negative Respiratory viral panel 9/20>> negative Blood culture x2 9/20>> no growth to date Urine 9/21>> no growth Sputum culture 9/21>> normal respiratory flora (no staph aureus or Pseudomonas) Sputum culture 9/23>>  ANTIBIOTICS: Zosyn  Doxycycline   CC follow up resp failure HPI End stage lung disease Severe resp failure Remains critically ill  Vent Mode: PCV FiO2 (%):  [60 %] 60 % Set Rate:  [34 bmp] 34 bmp PEEP:  [8 cmH20] 8 cmH20 Pressure Support:  [27 cmH20] 27 cmH20  CBC    Component Value Date/Time   WBC 18.5 (H) 02/18/2020 0512   RBC 3.65 (L) 02/18/2020 0512   HGB 10.4 (L) 02/18/2020 0512   HCT 35.3 (L) 02/18/2020 0512   PLT 259 02/18/2020 0512   MCV 96.7 02/18/2020 0512   MCH 28.5 02/18/2020 0512   MCHC 29.5 (L) 02/18/2020 0512   RDW 14.2 02/18/2020 0512   LYMPHSABS 0.7 02/18/2020 0512   MONOABS 1.3 (H) 02/18/2020 0512   EOSABS 0.0 02/18/2020 0512   BASOSABS 0.1 02/18/2020 0512   BMP Latest Ref Rng & Units 02/18/2020  02/17/2020 02/14/2020  Glucose 70 - 99 mg/dL 644(I) 347(Q) 259(D)  BUN 8 - 23 mg/dL 63(O) 75(I) 43(P)  Creatinine 0.61 - 1.24 mg/dL 2.95(J) 8.84(Z) 6.60(Y)  Sodium 135 - 145 mmol/L 143 144 147(H)  Potassium 3.5 - 5.1 mmol/L 4.8 4.9 4.1  Chloride 98 - 111 mmol/L 99 103 104  CO2 22 - 32 mmol/L  34(H) 35(H) 33(H)  Calcium 8.9 - 10.3 mg/dL 8.1(L) 8.3(L) 8.3(L)      REVIEW OF SYSTEMS  PATIENT IS UNABLE TO PROVIDE COMPLETE REVIEW OF SYSTEM S DUE TO SEVERE CRITICAL ILLNESS AND ENCEPHALOPATHY  VITAL SIGNS: Temp:  [97.6 F (36.4 C)-98.2 F (36.8 C)] 98 F (36.7 C) (09/30 1600) Pulse Rate:  [51-85] 67 (09/30 1700) Resp:  [27-38] 35 (09/30 1700) BP: (87-113)/(52-66) 96/56 (09/30 1700) SpO2:  [90 %-95 %] 93 % (09/30 1700) FiO2 (%):  [60 %] 60 % (09/30 1600)  PHYSICAL EXAMINATION:  GENERAL:critically ill appearing, +resp distress HEAD: Normocephalic, atraumatic.  EYES: Pupils equal, round, reactive to light.  No scleral icterus.  MOUTH: Moist mucosal membrane. NECK: Supple. No thyromegaly. No nodules. No JVD.  PULMONARY: +rhonchi, +wheezing CARDIOVASCULAR: S1 and S2. Regular rate and rhythm. No murmurs, rubs, or gallops.  GASTROINTESTINAL: Soft, nontender, -distended. Positive bowel sounds.  MUSCULOSKELETAL: No swelling, clubbing, or edema.  NEUROLOGIC: obtunded SKIN:intact,warm,dry    Recent Labs  Lab 02/14/20 0231 02/17/20 0445 02/18/20 0512  NA 147* 144 143  K 4.1 4.9 4.8  CL 104 103 99  CO2 33* 35* 34*  BUN 33* 28* 27*  CREATININE 0.50* 0.34* 0.41*  GLUCOSE 164* 155* 185*   Recent Labs  Lab 02/16/20 0817 02/17/20 0445 02/18/20 0512  HGB 10.2* 9.9* 10.4*  HCT 32.9* 33.4* 35.3*  WBC 22.0* 17.8* 18.5*  PLT 301 289 259   No results found.  ASSESSMENT / PLAN:  Acute exacerbation of Idiopathic pulmonary fibosis   - there is right upper lobe consolidation suggestive of acute infectious cause of current exacerbation-s/p CAP treatment with zosyn and vanco    - will obtain serology for ANA comprehensive, cryptococcal antigen, aspergillus ab, fungitell   - Respiratory viral panel    - sputum bacterial cultures   - procalcitonin trend   - MRSA PCR   - empiric IV dose steroids -increased to solumedrol 80q8h>>60>>40tid  -patient worked up in clinic and has  antifibrotic therapy done-esbriet approval in progress   Atrial fibrillation with Rapid ventricular response-resolved   - patient with HR> 150 this am  - on cardizem gtt   - BP bordeline low MAP 80s  - s/p cardio evaluation - appreciate input - follow recommendations - cardizem and digoxin   GERD   -this should be very tightly controlled while in acute exacerbation of IPF - will start protonix 40 bidGI GI PROPHYLAXIS as indicated   NUTRITIONAL STATUS DIET-->TF's as tolerated Constipation protocol as indicated   NEUROLOGY Acute toxic metabolic encephalopathy, need for sedation Goal RASS -2 to -3    Critical Care Time devoted to patient care services described in this note is 32 minutes.   Overall, patient is critically ill, prognosis is guarded.  Patient with Multiorgan failure and at high risk for cardiac arrest and death.       Vida Rigger, M.D.  Pulmonary & Critical Care Medicine  [facility]

## 2020-02-18 NOTE — Progress Notes (Addendum)
Nutrition Follow-up  DOCUMENTATION CODES:   Not applicable  INTERVENTION:  Initiate new goal TF regimen of Vital High Protein at 50 mL/hr (1200 mL goal daily volume) + PROSource 45 mL BID per tube. Provides 1280 kcal, 127 grams of protein, 1008 mL H2O daily. With current propofol rate provides 2046 kcal daily.  NUTRITION DIAGNOSIS:   Inadequate oral intake related to inability to eat (pt sedated and ventilated) as evidenced by NPO status.  Ongoing.  GOAL:   Provide needs based on ASPEN/SCCM guidelines  Met with TF regimen.  MONITOR:   Vent status, Labs, Weight trends, TF tolerance, Skin, I & O's  REASON FOR ASSESSMENT:   Ventilator    ASSESSMENT:   69 y.o. Male admitted with Acute on Chronic Hypoxic Respiratory Failure in the setting of Community Acquired Pneumonia and Acute Exacerbation of Idiopathic Pulmonary Fibrosis.  Required emergent intubation on 02/11/20.  Patient is currently intubated on ventilator support MV: 15 L/min Temp (24hrs), Avg:98.1 F (36.7 C), Min:97.6 F (36.4 C), Max:98.6 F (37 C)  Propofol: 29 ml/hr (766 kcal daily)  Medications reviewed and include: free water flush 200 mL Q4hrs, Novolog 0-15 units Q4hrs, Solu-Medrol 40 mg TID, Protonix, fentnayl gtt, phenylephrine gtt at 30 mcg/min, propofol gtt.  Labs reviewed: CBG 136-176, CO2 34, BUN 27, Creatinine 0.41.  I/O: 3100 mL UOP yesterday (1.6 mL/kg/hr)  Weight trend: no wt to trend since 80.5 kg on 9/22  Enteral Access: OGT placed 9/23; enters stomach per chest x-ray 9/23 but tip not visualized; per chest x-ray 9/23 tube tip is still off field of view but below left hemidiaphragm and side hold is distal to GE junction  TF regimen: Vital AF 1.2 Cal at 55 mL/hr + PROSource TF 45 mL TID  Discussed with RN and on rounds.  Diet Order:   Diet Order            Diet NPO time specified  Diet effective now                EDUCATION NEEDS:   No education needs have been identified at this  time  Skin:  Skin Assessment: Reviewed RN Assessment  Last BM:  02/10/2020 - small type 5  Height:   Ht Readings from Last 1 Encounters:  02/09/20 5' 10"  (1.778 m)   Weight:   Wt Readings from Last 1 Encounters:  02/10/20 80.5 kg   Ideal Body Weight:  75.45 kg  BMI:  Body mass index is 25.45 kg/m.  Estimated Nutritional Needs:   Kcal:  1959 (PSU 2003b w/ MSJ 1591, Ve 15, Tmax 37)  Protein:  115-130 grams  Fluid:  >/= 2 L/day  Jacklynn Barnacle, MS, RD, LDN Pager number available on Amion

## 2020-02-18 NOTE — Progress Notes (Signed)
SUBJECTIVE: Patient remains intubated   Vitals:   02/18/20 0700 02/18/20 0730 02/18/20 0800 02/18/20 0808  BP: (!) 89/53 95/60 96/62    Pulse: 67 66 69   Resp: (!) 27 (!) 34 (!) 34   Temp:      TempSrc:      SpO2: 94% 93% 93% 94%  Weight:      Height:        Intake/Output Summary (Last 24 hours) at 02/18/2020 0833 Last data filed at 02/18/2020 0600 Gross per 24 hour  Intake 5418.22 ml  Output 3100 ml  Net 2318.22 ml    LABS: Basic Metabolic Panel: Recent Labs    02/17/20 0445 02/18/20 0512  NA 144 143  K 4.9 4.8  CL 103 99  CO2 35* 34*  GLUCOSE 155* 185*  BUN 28* 27*  CREATININE 0.34* 0.41*  CALCIUM 8.3* 8.1*  MG 2.6* 2.4  PHOS 4.1 3.6   Liver Function Tests: No results for input(s): AST, ALT, ALKPHOS, BILITOT, PROT, ALBUMIN in the last 72 hours. No results for input(s): LIPASE, AMYLASE in the last 72 hours. CBC: Recent Labs    02/17/20 0445 02/18/20 0512  WBC 17.8* 18.5*  NEUTROABS 14.5* 15.0*  HGB 9.9* 10.4*  HCT 33.4* 35.3*  MCV 97.7 96.7  PLT 289 259   Cardiac Enzymes: No results for input(s): CKTOTAL, CKMB, CKMBINDEX, TROPONINI in the last 72 hours. BNP: Invalid input(s): POCBNP D-Dimer: No results for input(s): DDIMER in the last 72 hours. Hemoglobin A1C: No results for input(s): HGBA1C in the last 72 hours. Fasting Lipid Panel: Recent Labs    02/18/20 0512  TRIG 89   Thyroid Function Tests: No results for input(s): TSH, T4TOTAL, T3FREE, THYROIDAB in the last 72 hours.  Invalid input(s): FREET3 Anemia Panel: No results for input(s): VITAMINB12, FOLATE, FERRITIN, TIBC, IRON, RETICCTPCT in the last 72 hours.   PHYSICAL EXAM General: Well developed, well nourished, in no acute distress HEENT:  Normocephalic and atramatic Neck:  No JVD.  Lungs: Clear bilaterally to auscultation and percussion. Heart: HRRR . Normal S1 and S2 without gallops or murmurs.  Abdomen: Bowel sounds are positive, abdomen soft and non-tender  Msk:  Back normal,  normal gait. Normal strength and tone for age. Extremities: No clubbing, cyanosis or edema.   Neuro: Alert and oriented X 3. Psych:  Good affect, responds appropriately  TELEMETRY: Sinus rhythm  ASSESSMENT AND PLAN: Respiratory failure with pulmonary fibrosis and atrial fibrillation with rapid ventricular response rate.  Right now patient is in sinus rhythm.  Principal Problem:   Acute on chronic respiratory failure with hypoxia (HCC) Active Problems:   Gastroesophageal reflux disease   CAP (community acquired pneumonia)   Sepsis (HCC)   Pulmonary fibrosis (HCC)   Atrial fibrillation with RVR (HCC)    02/20/20 A, MD, [facility] 02/18/2020 8:33 AM

## 2020-02-19 ENCOUNTER — Inpatient Hospital Stay: Payer: BC Managed Care – PPO

## 2020-02-19 LAB — CBC WITH DIFFERENTIAL/PLATELET
Abs Immature Granulocytes: 1.42 10*3/uL — ABNORMAL HIGH (ref 0.00–0.07)
Basophils Absolute: 0.2 10*3/uL — ABNORMAL HIGH (ref 0.0–0.1)
Basophils Relative: 1 %
Eosinophils Absolute: 0 10*3/uL (ref 0.0–0.5)
Eosinophils Relative: 0 %
HCT: 34 % — ABNORMAL LOW (ref 39.0–52.0)
Hemoglobin: 10.4 g/dL — ABNORMAL LOW (ref 13.0–17.0)
Immature Granulocytes: 6 %
Lymphocytes Relative: 2 %
Lymphs Abs: 0.4 10*3/uL — ABNORMAL LOW (ref 0.7–4.0)
MCH: 29.5 pg (ref 26.0–34.0)
MCHC: 30.6 g/dL (ref 30.0–36.0)
MCV: 96.6 fL (ref 80.0–100.0)
Monocytes Absolute: 1.6 10*3/uL — ABNORMAL HIGH (ref 0.1–1.0)
Monocytes Relative: 7 %
Neutro Abs: 18.8 10*3/uL — ABNORMAL HIGH (ref 1.7–7.7)
Neutrophils Relative %: 84 %
Platelets: 242 10*3/uL (ref 150–400)
RBC: 3.52 MIL/uL — ABNORMAL LOW (ref 4.22–5.81)
RDW: 14.2 % (ref 11.5–15.5)
Smear Review: NORMAL
WBC: 22.4 10*3/uL — ABNORMAL HIGH (ref 4.0–10.5)
nRBC: 0.1 % (ref 0.0–0.2)

## 2020-02-19 LAB — BASIC METABOLIC PANEL
Anion gap: 3 — ABNORMAL LOW (ref 5–15)
BUN: 25 mg/dL — ABNORMAL HIGH (ref 8–23)
CO2: 42 mmol/L — ABNORMAL HIGH (ref 22–32)
Calcium: 8.1 mg/dL — ABNORMAL LOW (ref 8.9–10.3)
Chloride: 97 mmol/L — ABNORMAL LOW (ref 98–111)
Creatinine, Ser: 0.32 mg/dL — ABNORMAL LOW (ref 0.61–1.24)
GFR calc Af Amer: >60 mL/min (ref >60)
GFR calc non Af Amer: >60 mL/min (ref >60)
Glucose, Bld: 152 mg/dL — ABNORMAL HIGH (ref 70–99)
Potassium: 4.7 mmol/L (ref 3.5–5.1)
Sodium: 142 mmol/L (ref 135–145)

## 2020-02-19 LAB — GLUCOSE, CAPILLARY
Glucose-Capillary: 118 mg/dL — ABNORMAL HIGH (ref 70–99)
Glucose-Capillary: 122 mg/dL — ABNORMAL HIGH (ref 70–99)
Glucose-Capillary: 148 mg/dL — ABNORMAL HIGH (ref 70–99)
Glucose-Capillary: 149 mg/dL — ABNORMAL HIGH (ref 70–99)
Glucose-Capillary: 155 mg/dL — ABNORMAL HIGH (ref 70–99)
Glucose-Capillary: 163 mg/dL — ABNORMAL HIGH (ref 70–99)
Glucose-Capillary: 178 mg/dL — ABNORMAL HIGH (ref 70–99)

## 2020-02-19 LAB — MAGNESIUM: Magnesium: 2.4 mg/dL (ref 1.7–2.4)

## 2020-02-19 LAB — PHOSPHORUS: Phosphorus: 4.2 mg/dL (ref 2.5–4.6)

## 2020-02-19 MED ORDER — SODIUM CHLORIDE 0.9 % IV SOLN
0.0000 ug/min | INTRAVENOUS | Status: DC
  Administered 2020-02-19: 50 ug/min via INTRAVENOUS
  Administered 2020-02-19 (×2): 80 ug/min via INTRAVENOUS
  Administered 2020-02-19: 200 ug/min via INTRAVENOUS
  Administered 2020-02-19: 100 ug/min via INTRAVENOUS
  Administered 2020-02-19: 50 ug/min via INTRAVENOUS
  Administered 2020-02-19: 100 ug/min via INTRAVENOUS
  Administered 2020-02-20 (×3): 80 ug/min via INTRAVENOUS
  Administered 2020-02-20: 50 ug/min via INTRAVENOUS
  Administered 2020-02-20: 80 ug/min via INTRAVENOUS
  Administered 2020-02-20: 60 ug/min via INTRAVENOUS
  Administered 2020-02-20: 70 ug/min via INTRAVENOUS
  Administered 2020-02-20: 80 ug/min via INTRAVENOUS
  Administered 2020-02-21: 30 ug/min via INTRAVENOUS
  Administered 2020-02-21: 50 ug/min via INTRAVENOUS
  Administered 2020-02-21: 40 ug/min via INTRAVENOUS
  Filled 2020-02-19: qty 1
  Filled 2020-02-19 (×2): qty 10
  Filled 2020-02-19: qty 1
  Filled 2020-02-19 (×3): qty 10
  Filled 2020-02-19: qty 1
  Filled 2020-02-19: qty 10
  Filled 2020-02-19: qty 1
  Filled 2020-02-19: qty 10
  Filled 2020-02-19: qty 1
  Filled 2020-02-19 (×3): qty 10
  Filled 2020-02-19 (×2): qty 1
  Filled 2020-02-19 (×3): qty 10

## 2020-02-19 NOTE — Progress Notes (Signed)
SUBJECTIVE: Patient remains intubated  Vitals:   02/19/20 0800 02/19/20 0806 02/19/20 0815 02/19/20 0830  BP: (!) 82/51  (!) 142/79 (!) 143/76  Pulse: 70  (!) 59 68  Resp: (!) 30  (!) 22 (!) 24  Temp:      TempSrc:      SpO2: 97% 92% 96% 94%  Weight:      Height:        Intake/Output Summary (Last 24 hours) at 02/19/2020 0835 Last data filed at 02/19/2020 0500 Gross per 24 hour  Intake 2449.55 ml  Output 2650 ml  Net -200.45 ml    LABS: Basic Metabolic Panel: Recent Labs    02/18/20 0512 02/19/20 0500  NA 143 142  K 4.8 4.7  CL 99 97*  CO2 34* 42*  GLUCOSE 185* 152*  BUN 27* 25*  CREATININE 0.41* 0.32*  CALCIUM 8.1* 8.1*  MG 2.4 2.4  PHOS 3.6 4.2   Liver Function Tests: No results for input(s): AST, ALT, ALKPHOS, BILITOT, PROT, ALBUMIN in the last 72 hours. No results for input(s): LIPASE, AMYLASE in the last 72 hours. CBC: Recent Labs    02/18/20 0512 02/19/20 0500  WBC 18.5* 22.4*  NEUTROABS 15.0* 18.8*  HGB 10.4* 10.4*  HCT 35.3* 34.0*  MCV 96.7 96.6  PLT 259 242   Cardiac Enzymes: No results for input(s): CKTOTAL, CKMB, CKMBINDEX, TROPONINI in the last 72 hours. BNP: Invalid input(s): POCBNP D-Dimer: No results for input(s): DDIMER in the last 72 hours. Hemoglobin A1C: No results for input(s): HGBA1C in the last 72 hours. Fasting Lipid Panel: Recent Labs    02/18/20 0512  TRIG 89   Thyroid Function Tests: No results for input(s): TSH, T4TOTAL, T3FREE, THYROIDAB in the last 72 hours.  Invalid input(s): FREET3 Anemia Panel: No results for input(s): VITAMINB12, FOLATE, FERRITIN, TIBC, IRON, RETICCTPCT in the last 72 hours.   PHYSICAL EXAM General: Well developed, well nourished, in no acute distress HEENT:  Normocephalic and atramatic Neck:  No JVD.  Lungs: Clear bilaterally to auscultation and percussion. Heart: HRRR . Normal S1 and S2 without gallops or murmurs.  Abdomen: Bowel sounds are positive, abdomen soft and non-tender  Msk:   Back normal, normal gait. Normal strength and tone for age. Extremities: No clubbing, cyanosis or edema.   Neuro: Alert and oriented X 3. Psych:  Good affect, responds appropriately  TELEMETRY: Sinus rhythm 70 bpm  ASSESSMENT AND PLAN: Paroxysmal atrial fibrillation currently in sinus rhythm off digoxin because dig level was high.  May need 0.125 digoxin IV every other day.  At this time I do not think because patient remains in sinus rhythm and is on in ventilator there is any need to give digoxin.  Patient has respiratory failure due to pulmonary fibrosis.  Principal Problem:   Acute on chronic respiratory failure with hypoxia (HCC) Active Problems:   Gastroesophageal reflux disease   CAP (community acquired pneumonia)   Sepsis (HCC)   Pulmonary fibrosis (HCC)   Atrial fibrillation with RVR (HCC)    Adrian Blackwater A, MD, [facility] 02/19/2020 8:35 AM

## 2020-02-19 NOTE — Progress Notes (Signed)
Name: Donald Sellers MRN: [id number] DOB: 1951-01-10    ADMISSION DATE:  01/25/2020 CONSULTATION DATE:  02/11/2020  REFERRING MD :  Dr. Denton Lank  CHIEF COMPLAINT:  Acute Respiratory Distress  BRIEF PATIENT DESCRIPTION:  69 y.o. Male admitted with Acute on Chronic Hypoxic Respiratory Failure in the setting of Community Acquired Pneumonia and Acute Exacerbation of Idiopathic Pulmonary Fibrosis.  Required emergent intubation on 02/11/20.  SIGNIFICANT EVENTS  02/09/20- Patient in AFrVR during evaluation this am.  He was placed on BIPAP and is on cardizem gtt.  He may need closer monitoring since he is now on drip and NIV.  Will attempt to wean off both today and if unable to will advance care to SDU.  02/10/20- patient is improved, he has + bacterial respiratory culture with strep species.   His AFRVR is also improved and he has been seen by cariology.  He shares dyspnea and SOB is somewhat improved and he is off BIPAP during my evaluation.  02/11/20: Acute Respiratory Distress requiring emergent intubation 9/24 remains intubated 02/15/20- Weaning FiO2 , will d/c doxy and zosyn as patient completed 5d already and resp cutlures negative on tracheal aspirate 9/28- patient had respiratory distress during weaning trial today.  He is in SR now, ive dcd duOnebs and levophed to avoid recurrence of AFrVR. Plan to continue to wean down FiO2 and extubate when patient passing SBT.  02/17/20- patient is on mechanical ventilation, ive been able to meet with daughter at bedside. Patient was unable to pass SBT today went in to coughing with respiratory distress.  02/18/20- patient is at 60% on FIo2,  I was able to meet with daughter at bedside. Decreased dose of steroids today to 40 TID.  02/19/20-patient remains critically ill.  Repeat CXR this am with no significant changes.   STUDIES:  9/20: CXR>>Widespread fibrosis, essentially stable. It is difficult to exclude a degree of underlying emphysematous change. The  appearance is essentially stable compared to prior study. No consolidation evident. Heart upper normal in size. 9/20: CTA Chest>>1. No evidence of pulmonary embolism. 2. Progression of extensive bilateral interstitial and consolidative opacities, even compared to recent chest CT performed 12/11/2019, worrisome for progressive fibrotic lung disease, nonspecific though suggestive of UIP given probable honeycombing within the right lower lobe. 3. Improved aeration of previously noted consolidative opacities within the left upper lobe suggestive of resolved infection and/or inflammation. 4. Redemonstrated mediastinal and bilateral hilar lymphadenopathy, nonspecific though presumably reactive in etiology.  CULTURES: SARS-CoV-2 PCR 9/20>> negative Respiratory viral panel 9/20>> negative Blood culture x2 9/20>> no growth to date Urine 9/21>> no growth Sputum culture 9/21>> normal respiratory flora (no staph aureus or Pseudomonas) Sputum culture 9/23>>  ANTIBIOTICS: Zosyn  Doxycycline   CC follow up resp failure HPI End stage lung disease Severe resp failure Remains critically ill  Vent Mode: PCV FiO2 (%):  [60 %-80 %] 60 % Set Rate:  [34 bmp] 34 bmp PEEP:  [8 cmH20] 8 cmH20 Plateau Pressure:  [30 cmH20-31 cmH20] 31 cmH20  CBC    Component Value Date/Time   WBC 22.4 (H) 02/19/2020 0500   RBC 3.52 (L) 02/19/2020 0500   HGB 10.4 (L) 02/19/2020 0500   HCT 34.0 (L) 02/19/2020 0500   PLT 242 02/19/2020 0500   MCV 96.6 02/19/2020 0500   MCH 29.5 02/19/2020 0500   MCHC 30.6 02/19/2020 0500   RDW 14.2 02/19/2020 0500   LYMPHSABS 0.4 (L) 02/19/2020 0500   MONOABS 1.6 (H) 02/19/2020 0500   EOSABS 0.0 02/19/2020  0500   BASOSABS 0.2 (H) 02/19/2020 0500   BMP Latest Ref Rng & Units 02/19/2020 02/18/2020 02/17/2020  Glucose 70 - 99 mg/dL 270(W) 237(S) 283(T)  BUN 8 - 23 mg/dL 51(V) 61(Y) 07(P)  Creatinine 0.61 - 1.24 mg/dL 7.10(G) 2.69(S) 8.54(O)  Sodium 135 - 145 mmol/L 142 143  144  Potassium 3.5 - 5.1 mmol/L 4.7 4.8 4.9  Chloride 98 - 111 mmol/L 97(L) 99 103  CO2 22 - 32 mmol/L 42(H) 34(H) 35(H)  Calcium 8.9 - 10.3 mg/dL 8.1(L) 8.1(L) 8.3(L)      REVIEW OF SYSTEMS  PATIENT IS UNABLE TO PROVIDE COMPLETE REVIEW OF SYSTEM S DUE TO SEVERE CRITICAL ILLNESS AND ENCEPHALOPATHY  VITAL SIGNS: Temp:  [97 F (36.1 C)-98.6 F (37 C)] 97 F (36.1 C) (10/01 1100) Pulse Rate:  [59-103] 103 (10/01 1445) Resp:  [11-36] 35 (10/01 1445) BP: (80-162)/(51-96) 133/96 (10/01 1445) SpO2:  [87 %-97 %] 90 % (10/01 1445) FiO2 (%):  [60 %-80 %] 60 % (10/01 1320)  PHYSICAL EXAMINATION:  GENERAL:critically ill appearing, +resp distress HEAD: Normocephalic, atraumatic.  EYES: Pupils equal, round, reactive to light.  No scleral icterus.  MOUTH: Moist mucosal membrane. NECK: Supple. No thyromegaly. No nodules. No JVD.  PULMONARY: +rhonchi, +wheezing CARDIOVASCULAR: S1 and S2. Regular rate and rhythm. No murmurs, rubs, or gallops.  GASTROINTESTINAL: Soft, nontender, -distended. Positive bowel sounds.  MUSCULOSKELETAL: No swelling, clubbing, or edema.  NEUROLOGIC: obtunded SKIN:intact,warm,dry    Recent Labs  Lab 02/17/20 0445 02/18/20 0512 02/19/20 0500  NA 144 143 142  K 4.9 4.8 4.7  CL 103 99 97*  CO2 35* 34* 42*  BUN 28* 27* 25*  CREATININE 0.34* 0.41* 0.32*  GLUCOSE 155* 185* 152*   Recent Labs  Lab 02/17/20 0445 02/18/20 0512 02/19/20 0500  HGB 9.9* 10.4* 10.4*  HCT 33.4* 35.3* 34.0*  WBC 17.8* 18.5* 22.4*  PLT 289 259 242   No results found.  ASSESSMENT / PLAN:  Acute exacerbation of Idiopathic pulmonary fibosis   - there is right upper lobe consolidation suggestive of acute infectious cause of current exacerbation-s/p CAP treatment with zosyn and vanco    - will obtain serology for ANA comprehensive, cryptococcal antigen, aspergillus ab, fungitell   - Respiratory viral panel    - sputum bacterial cultures   - procalcitonin trend   - MRSA PCR    - empiric IV dose steroids -increased to solumedrol 80q8h>>60>>40tid  -patient worked up in clinic and has antifibrotic therapy done-esbriet approval in progress   Atrial fibrillation with Rapid ventricular response-resolved   - patient with HR> 150 this am  - on cardizem gtt   - BP bordeline low MAP 80s  - s/p cardio evaluation - appreciate input - follow recommendations - cardizem and digoxin   GERD   -this should be very tightly controlled while in acute exacerbation of IPF - will start protonix 40 bidGI GI PROPHYLAXIS as indicated   NUTRITIONAL STATUS DIET-->TF's as tolerated Constipation protocol as indicated   NEUROLOGY Acute toxic metabolic encephalopathy, need for sedation Goal RASS -2 to -3    Critical Care Time devoted to patient care services described in this note is 32 minutes.   Overall, patient is critically ill, prognosis is guarded.  Patient with Multiorgan failure and at high risk for cardiac arrest and death.       Vida Rigger, M.D.  Pulmonary & Critical Care Medicine  [facility]

## 2020-02-19 DEATH — deceased

## 2020-02-20 LAB — GLUCOSE, CAPILLARY
Glucose-Capillary: 135 mg/dL — ABNORMAL HIGH (ref 70–99)
Glucose-Capillary: 142 mg/dL — ABNORMAL HIGH (ref 70–99)
Glucose-Capillary: 156 mg/dL — ABNORMAL HIGH (ref 70–99)
Glucose-Capillary: 191 mg/dL — ABNORMAL HIGH (ref 70–99)
Glucose-Capillary: 152 mg/dL — ABNORMAL HIGH (ref 70–99)
Glucose-Capillary: 135 mg/dL — ABNORMAL HIGH (ref 70–99)

## 2020-02-20 LAB — CBC WITH DIFFERENTIAL/PLATELET
Abs Immature Granulocytes: 1.08 10*3/uL — ABNORMAL HIGH (ref 0.00–0.07)
Basophils Absolute: 0.1 10*3/uL (ref 0.0–0.1)
Basophils Relative: 1 %
Eosinophils Absolute: 0 10*3/uL (ref 0.0–0.5)
Eosinophils Relative: 0 %
HCT: 35.2 % — ABNORMAL LOW (ref 39.0–52.0)
Hemoglobin: 10.6 g/dL — ABNORMAL LOW (ref 13.0–17.0)
Immature Granulocytes: 5 %
Lymphocytes Relative: 2 %
Lymphs Abs: 0.3 10*3/uL — ABNORMAL LOW (ref 0.7–4.0)
MCH: 29.5 pg (ref 26.0–34.0)
MCHC: 30.1 g/dL (ref 30.0–36.0)
MCV: 98.1 fL (ref 80.0–100.0)
Monocytes Absolute: 1.2 10*3/uL — ABNORMAL HIGH (ref 0.1–1.0)
Monocytes Relative: 6 %
Neutro Abs: 17.6 10*3/uL — ABNORMAL HIGH (ref 1.7–7.7)
Neutrophils Relative %: 86 %
Platelets: 215 10*3/uL (ref 150–400)
RBC: 3.59 MIL/uL — ABNORMAL LOW (ref 4.22–5.81)
RDW: 14.2 % (ref 11.5–15.5)
WBC: 20.3 10*3/uL — ABNORMAL HIGH (ref 4.0–10.5)
nRBC: 0.1 % (ref 0.0–0.2)

## 2020-02-20 LAB — MAGNESIUM: Magnesium: 2.3 mg/dL (ref 1.7–2.4)

## 2020-02-20 LAB — BASIC METABOLIC PANEL
Anion gap: 3 — ABNORMAL LOW (ref 5–15)
BUN: 30 mg/dL — ABNORMAL HIGH (ref 8–23)
CO2: 41 mmol/L — ABNORMAL HIGH (ref 22–32)
Calcium: 8.4 mg/dL — ABNORMAL LOW (ref 8.9–10.3)
Chloride: 98 mmol/L (ref 98–111)
Creatinine, Ser: 0.44 mg/dL — ABNORMAL LOW (ref 0.61–1.24)
GFR calc Af Amer: >60 mL/min (ref >60)
GFR calc non Af Amer: >60 mL/min (ref >60)
Glucose, Bld: 166 mg/dL — ABNORMAL HIGH (ref 70–99)
Potassium: 5.1 mmol/L (ref 3.5–5.1)
Sodium: 142 mmol/L (ref 135–145)

## 2020-02-20 LAB — PHOSPHORUS: Phosphorus: 4 mg/dL (ref 2.5–4.6)

## 2020-02-20 LAB — TRIGLYCERIDES: Triglycerides: 106 mg/dL (ref <150)

## 2020-02-20 NOTE — Progress Notes (Signed)
Name: Donald Sellers MRN: [id number] DOB: 1951-03-25    ADMISSION DATE:  01/25/2020 CONSULTATION DATE:  02/11/2020  REFERRING MD :  Dr. Arbutus Ped  CHIEF COMPLAINT:  Acute Respiratory Distress  BRIEF PATIENT DESCRIPTION:  69 y.o. Male admitted with Acute on Chronic Hypoxic Respiratory Failure in the setting of Community Acquired Pneumonia and Acute Exacerbation of Idiopathic Pulmonary Fibrosis.  Required emergent intubation on 02/11/20.  SIGNIFICANT EVENTS  02/09/20- Patient in [place] during evaluation this am.  He was placed on BIPAP and is on cardizem gtt.  He may need closer monitoring since he is now on drip and NIV.  Will attempt to wean off both today and if unable to will advance care to SDU.  02/10/20- patient is improved, he has + bacterial respiratory culture with strep species.   His AFRVR is also improved and he has been seen by cariology.  He shares dyspnea and SOB is somewhat improved and he is off BIPAP during my evaluation.  02/11/20: Acute Respiratory Distress requiring emergent intubation 9/24 remains intubated 02/15/20- Weaning FiO2 , will d/c doxy and zosyn as patient completed 5d already and resp cutlures negative on tracheal aspirate 9/28- patient had respiratory distress during weaning trial today.  He is in SR now, ive dcd duOnebs and levophed to avoid recurrence of AFrVR. Plan to continue to wean down FiO2 and extubate when patient passing SBT.  02/17/20- patient is on mechanical ventilation, ive been able to meet with daughter at bedside. Patient was unable to pass SBT today went in to coughing with respiratory distress.  02/18/20- patient is at 60% on FIo2,  I was able to meet with daughter at bedside. Decreased dose of steroids today to 40 TID.  02/19/20-patient remains critically ill.  Repeat CXR this am with no significant changes.  02/20/20- Daughter at bedside this am, I have met with her, we discussed care plan and essentially no improvement x 5d already in ICU.   Unfortunately patient is with poor prognosis. Will ask PALS to evaluate.   STUDIES:  9/20: CXR>>Widespread fibrosis, essentially stable. It is difficult to exclude a degree of underlying emphysematous change. The appearance is essentially stable compared to prior study. No consolidation evident. Heart upper normal in size. 9/20: CTA Chest>>1. No evidence of pulmonary embolism. 2. Progression of extensive bilateral interstitial and consolidative opacities, even compared to recent chest CT performed 12/11/2019, worrisome for progressive fibrotic lung disease, nonspecific though suggestive of UIP given probable honeycombing within the right lower lobe. 3. Improved aeration of previously noted consolidative opacities within the left upper lobe suggestive of resolved infection and/or inflammation. 4. Redemonstrated mediastinal and bilateral hilar lymphadenopathy, nonspecific though presumably reactive in etiology.  CULTURES: SARS-CoV-2 PCR 9/20>> negative Respiratory viral panel 9/20>> negative Blood culture x2 9/20>> no growth to date Urine 9/21>> no growth Sputum culture 9/21>> normal respiratory flora (no staph aureus or Pseudomonas) Sputum culture 9/23>>  ANTIBIOTICS: Zosyn  Doxycycline   CC follow up resp failure HPI End stage lung disease Severe resp failure Remains critically ill  Vent Mode: PCV FiO2 (%):  [60 %] 60 % Set Rate:  [34 bmp] 34 bmp PEEP:  [8 cmH20] 8 cmH20 Pressure Support:  [27 cmH20] 27 cmH20 Plateau Pressure:  [33 cmH20] 33 cmH20  CBC    Component Value Date/Time   WBC 20.3 (H) 02/20/2020 0419   RBC 3.59 (L) 02/20/2020 0419   HGB 10.6 (L) 02/20/2020 0419   HCT 35.2 (L) 02/20/2020 0419   PLT 215 02/20/2020 0419  MCV 98.1 02/20/2020 0419   MCH 29.5 02/20/2020 0419   MCHC 30.1 02/20/2020 0419   RDW 14.2 02/20/2020 0419   LYMPHSABS 0.3 (L) 02/20/2020 0419   MONOABS 1.2 (H) 02/20/2020 0419   EOSABS 0.0 02/20/2020 0419   BASOSABS 0.1 02/20/2020  0419   BMP Latest Ref Rng & Units 02/20/2020 02/19/2020 02/18/2020  Glucose 70 - 99 mg/dL 166(H) 152(H) 185(H)  BUN 8 - 23 mg/dL 30(H) 25(H) 27(H)  Creatinine 0.61 - 1.24 mg/dL 0.44(L) 0.32(L) 0.41(L)  Sodium 135 - 145 mmol/L 142 142 143  Potassium 3.5 - 5.1 mmol/L 5.1 4.7 4.8  Chloride 98 - 111 mmol/L 98 97(L) 99  CO2 22 - 32 mmol/L 41(H) 42(H) 34(H)  Calcium 8.9 - 10.3 mg/dL 8.4(L) 8.1(L) 8.1(L)      REVIEW OF SYSTEMS  PATIENT IS UNABLE TO PROVIDE COMPLETE REVIEW OF SYSTEM S DUE TO SEVERE CRITICAL ILLNESS AND ENCEPHALOPATHY  VITAL SIGNS: Temp:  [97.4 F (36.3 C)-98.4 F (36.9 C)] 97.8 F (36.6 C) (10/02 1230) Pulse Rate:  [80-93] 80 (10/02 1545) Resp:  [27-34] 34 (10/02 1545) BP: (78-96)/(55-69) 94/67 (10/02 1545) SpO2:  [88 %-96 %] 91 % (10/02 1545) FiO2 (%):  [60 %] 60 % (10/02 1443)  PHYSICAL EXAMINATION:  GENERAL:critically ill appearing, +resp distress HEAD: Normocephalic, atraumatic.  EYES: Pupils equal, round, reactive to light.  No scleral icterus.  MOUTH: Moist mucosal membrane. NECK: Supple. No thyromegaly. No nodules. No JVD.  PULMONARY: +rhonchi, +wheezing CARDIOVASCULAR: S1 and S2. Regular rate and rhythm. No murmurs, rubs, or gallops.  GASTROINTESTINAL: Soft, nontender, -distended. Positive bowel sounds.  MUSCULOSKELETAL: No swelling, clubbing, or edema.  NEUROLOGIC: obtunded SKIN:intact,warm,dry    Recent Labs  Lab 02/18/20 0512 02/19/20 0500 02/20/20 0419  NA 143 142 142  K 4.8 4.7 5.1  CL 99 97* 98  CO2 34* 42* 41*  BUN 27* 25* 30*  CREATININE 0.41* 0.32* 0.44*  GLUCOSE 185* 152* 166*   Recent Labs  Lab 02/18/20 0512 02/19/20 0500 02/20/20 0419  HGB 10.4* 10.4* 10.6*  HCT 35.3* 34.0* 35.2*  WBC 18.5* 22.4* 20.3*  PLT 259 242 215   DG Chest Port 1 View  Result Date: 02/19/2020 CLINICAL DATA:  Abnormal chest x-ray.  Intubated patient. EXAM: PORTABLE CHEST 1 VIEW COMPARISON:  Most recent radiograph 02/13/2020. Most recent CT  01/29/2020 FINDINGS: The endotracheal tube tip is 4 cm from the carina. Enteric tube in place with tip below the diaphragm not included in the field of view. Left upper extremity PICC in the SVC. Stable heart size and mediastinal contours. Stable appearance of diffuse ground-glass opacities throughout both lungs, bronchiectasis seen on prior CT. Previous subcutaneous emphysema in the neck has resolved. No visualized pneumothorax. No large pleural effusion. IMPRESSION: 1. Stable support apparatus. 2. No significant change in diffuse ground-glass opacities throughout both lungs, with bronchiectasis and fibrosis seen on prior CT. Electronically Signed   By: Keith Rake M.D.   On: 02/19/2020 15:42    ASSESSMENT / PLAN:  Acute exacerbation of Idiopathic pulmonary fibosis   - there is right upper lobe consolidation suggestive of acute infectious cause of current exacerbation-s/p CAP treatment with zosyn and vanco    - will obtain serology for ANA comprehensive, cryptococcal antigen, aspergillus ab, fungitell   - Respiratory viral panel    - sputum bacterial cultures   - procalcitonin trend   - MRSA PCR   - empiric IV dose steroids -increased to solumedrol 80q8h>>60>>40tid>40bid  -patient worked up in clinic and  has antifibrotic therapy done-esbriet approval in progress   Atrial fibrillation with Rapid ventricular response-resolved   - patient with HR> 150 this am  - on cardizem gtt   - BP bordeline low MAP 80s  - s/p cardio evaluation - appreciate input - follow recommendations - cardizem and digoxin   GERD   -this should be very tightly controlled while in acute exacerbation of IPF - will start protonix 40 bidGI GI PROPHYLAXIS as indicated   NUTRITIONAL STATUS DIET-->TF's as tolerated Constipation protocol as indicated   NEUROLOGY Acute toxic metabolic encephalopathy, need for sedation Goal RASS -2 to -3  Critical care provider statement:    Critical care time (minutes):   32   Critical care time was exclusive of:  Separately billable procedures and  treating other patients   Critical care was necessary to treat or prevent imminent or  life-threatening deterioration of the following conditions:  acute on chronic hypoxemic respiratory failure, acute exacerbation of idiopathic pulmonary fibrosis.    Critical care was time spent personally by me on the following  activities:  Development of treatment plan with patient or surrogate,  discussions with consultants, evaluation of patient's response to  treatment, examination of patient, obtaining history from patient or  surrogate, ordering and performing treatments and interventions, ordering  and review of laboratory studies and re-evaluation of patient's condition   I assumed direction of critical care for this patient from another  provider in my specialty: no     Critical Care Time devoted to patient care services described in this note is 32 minutes.   Overall, patient is critically ill, prognosis is guarded.  Patient with Multiorgan failure and at high risk for cardiac arrest and death.       Ottie Glazier, M.D.  Pulmonary & [place]

## 2020-02-21 LAB — BASIC METABOLIC PANEL
Anion gap: 4 — ABNORMAL LOW (ref 5–15)
BUN: 39 mg/dL — ABNORMAL HIGH (ref 8–23)
CO2: 39 mmol/L — ABNORMAL HIGH (ref 22–32)
Calcium: 7.7 mg/dL — ABNORMAL LOW (ref 8.9–10.3)
Chloride: 98 mmol/L (ref 98–111)
Creatinine, Ser: <0.3 mg/dL — ABNORMAL LOW (ref 0.61–1.24)
Glucose, Bld: 193 mg/dL — ABNORMAL HIGH (ref 70–99)
Potassium: 4.9 mmol/L (ref 3.5–5.1)
Sodium: 141 mmol/L (ref 135–145)

## 2020-02-21 LAB — CBC WITH DIFFERENTIAL/PLATELET
Abs Immature Granulocytes: 1.91 10*3/uL — ABNORMAL HIGH (ref 0.00–0.07)
Basophils Absolute: 0.1 10*3/uL (ref 0.0–0.1)
Basophils Relative: 1 %
Eosinophils Absolute: 0 10*3/uL (ref 0.0–0.5)
Eosinophils Relative: 0 %
HCT: 33.9 % — ABNORMAL LOW (ref 39.0–52.0)
Hemoglobin: 10 g/dL — ABNORMAL LOW (ref 13.0–17.0)
Immature Granulocytes: 7 %
Lymphocytes Relative: 2 %
Lymphs Abs: 0.6 10*3/uL — ABNORMAL LOW (ref 0.7–4.0)
MCH: 29 pg (ref 26.0–34.0)
MCHC: 29.5 g/dL — ABNORMAL LOW (ref 30.0–36.0)
MCV: 98.3 fL (ref 80.0–100.0)
Monocytes Absolute: 1 10*3/uL (ref 0.1–1.0)
Monocytes Relative: 4 %
Neutro Abs: 23.6 10*3/uL — ABNORMAL HIGH (ref 1.7–7.7)
Neutrophils Relative %: 86 %
Platelets: 137 10*3/uL — ABNORMAL LOW (ref 150–400)
RBC: 3.45 MIL/uL — ABNORMAL LOW (ref 4.22–5.81)
RDW: 14.6 % (ref 11.5–15.5)
WBC: 27.2 10*3/uL — ABNORMAL HIGH (ref 4.0–10.5)
nRBC: 0.2 % (ref 0.0–0.2)

## 2020-02-21 LAB — GLUCOSE, CAPILLARY
Glucose-Capillary: 149 mg/dL — ABNORMAL HIGH (ref 70–99)
Glucose-Capillary: 154 mg/dL — ABNORMAL HIGH (ref 70–99)
Glucose-Capillary: 170 mg/dL — ABNORMAL HIGH (ref 70–99)
Glucose-Capillary: 172 mg/dL — ABNORMAL HIGH (ref 70–99)
Glucose-Capillary: 179 mg/dL — ABNORMAL HIGH (ref 70–99)
Glucose-Capillary: 182 mg/dL — ABNORMAL HIGH (ref 70–99)
Glucose-Capillary: 183 mg/dL — ABNORMAL HIGH (ref 70–99)
Glucose-Capillary: 186 mg/dL — ABNORMAL HIGH (ref 70–99)

## 2020-02-21 LAB — PHOSPHORUS: Phosphorus: 2.1 mg/dL — ABNORMAL LOW (ref 2.5–4.6)

## 2020-02-21 LAB — MAGNESIUM: Magnesium: 2.3 mg/dL (ref 1.7–2.4)

## 2020-02-21 MED ORDER — SODIUM PHOSPHATES 45 MMOLE/15ML IV SOLN
20.0000 mmol | Freq: Once | INTRAVENOUS | Status: AC
  Administered 2020-02-21: 20 mmol via INTRAVENOUS
  Filled 2020-02-21: qty 6.67

## 2020-02-21 MED ORDER — CHLORHEXIDINE GLUCONATE 0.12 % MT SOLN
OROMUCOSAL | Status: AC
  Administered 2020-02-21: 15 mL via OROMUCOSAL
  Filled 2020-02-21: qty 15

## 2020-02-21 MED ORDER — PHENYLEPHRINE HCL-NACL 10-0.9 MG/250ML-% IV SOLN
0.0000 ug/min | INTRAVENOUS | Status: DC
  Administered 2020-02-21: 20 ug/min via INTRAVENOUS
  Administered 2020-02-21: 5 ug/min via INTRAVENOUS
  Administered 2020-02-22: 20 ug/min via INTRAVENOUS
  Filled 2020-02-21 (×2): qty 250

## 2020-02-21 NOTE — Progress Notes (Addendum)
SUBJECTIVE: Patient continues to be critically ill, intubated and sedated.   Vitals:   02/21/20 0800 02/21/20 0813 02/21/20 0900 02/21/20 0950  BP: 113/70  106/66 99/74  Pulse: 73  75 88  Resp: (!) 33  (!) 32 (!) 38  Temp: (!) 97.4 F (36.3 C)     TempSrc: Axillary     SpO2: 92% 90% (!) 87% 91%  Weight:      Height:        Intake/Output Summary (Last 24 hours) at 02/21/2020 1014 Last data filed at 02/21/2020 0802 Gross per 24 hour  Intake 2678.28 ml  Output 2875 ml  Net -196.72 ml    LABS: Basic Metabolic Panel: Recent Labs    02/20/20 0419 02/21/20 0502  NA 142 141  K 5.1 4.9  CL 98 98  CO2 41* 39*  GLUCOSE 166* 193*  BUN 30* 39*  CREATININE 0.44* <0.30*  CALCIUM 8.4* 7.7*  MG 2.3 2.3  PHOS 4.0 2.1*   Liver Function Tests: No results for input(s): AST, ALT, ALKPHOS, BILITOT, PROT, ALBUMIN in the last 72 hours. No results for input(s): LIPASE, AMYLASE in the last 72 hours. CBC: Recent Labs    02/20/20 0419 02/21/20 0502  WBC 20.3* 27.2*  NEUTROABS 17.6* 23.6*  HGB 10.6* 10.0*  HCT 35.2* 33.9*  MCV 98.1 98.3  PLT 215 137*   Cardiac Enzymes: No results for input(s): CKTOTAL, CKMB, CKMBINDEX, TROPONINI in the last 72 hours. BNP: Invalid input(s): POCBNP D-Dimer: No results for input(s): DDIMER in the last 72 hours. Hemoglobin A1C: No results for input(s): HGBA1C in the last 72 hours. Fasting Lipid Panel: Recent Labs    02/20/20 0419  TRIG 106   Thyroid Function Tests: No results for input(s): TSH, T4TOTAL, T3FREE, THYROIDAB in the last 72 hours.  Invalid input(s): FREET3 Anemia Panel: No results for input(s): VITAMINB12, FOLATE, FERRITIN, TIBC, IRON, RETICCTPCT in the last 72 hours.   PHYSICAL EXAM General: Well developed, well nourished, in no acute distress HEENT:  Normocephalic and atramatic Neck:  No JVD.  Lungs: Intubated. Scattered rhonchi and expiratory wheezes. Heart: HRRR . Normal S1 and S2 without gallops or murmurs.  Abdomen:  Bowel sounds are positive, abdomen soft and non-tender  Msk:  Back normal, normal gait. Normal strength and tone for age. Extremities: No clubbing, cyanosis or edema.   Neuro: Intubated and sedated Psych:  UTA  TELEMETRY: NSR. 95/bpm  ASSESSMENT AND PLAN: Patient with a history of IPF was found to have sepsis and developed A. fib with RVR. A. fib with RVR remains resolved and patient continues to be in sinus. HR slightly elevated today, if starts to remain above 100/bpm can start digoxin 0.125 IV every other day. Patient continues to require vasopressor support and would recommend continuing to avoid norepinephrine as he has since converted to sinus rhythm. Patient now off heparin and back on eliquis. We will continue to follow  Principal Problem:   Acute on chronic respiratory failure with hypoxia [facility]) Active Problems:   Gastroesophageal reflux disease   CAP (community acquired pneumonia)   Sepsis (HCC)   Pulmonary fibrosis (HCC)   Atrial fibrillation with RVR (HCC)    Maryelizabeth Kaufmann, NP-C 02/21/2020 10:14 AM

## 2020-02-21 NOTE — Progress Notes (Signed)
Name: RAMAN FEATHERSTON MRN: [id number] DOB: 1950-10-16    ADMISSION DATE:  02/14/2020 CONSULTATION DATE:  02/11/2020  REFERRING MD :  Dr. Arbutus Ped  CHIEF COMPLAINT:  Acute Respiratory Distress  BRIEF PATIENT DESCRIPTION:  69 y.o. Male admitted with Acute on Chronic Hypoxic Respiratory Failure in the setting of Community Acquired Pneumonia and Acute Exacerbation of Idiopathic Pulmonary Fibrosis.  Required emergent intubation on 02/11/20.  SIGNIFICANT EVENTS  02/09/20- Patient in [place] during evaluation this am.  He was placed on BIPAP and is on cardizem gtt.  He may need closer monitoring since he is now on drip and NIV.  Will attempt to wean off both today and if unable to will advance care to SDU.  02/10/20- patient is improved, he has + bacterial respiratory culture with strep species.   His AFRVR is also improved and he has been seen by cariology.  He shares dyspnea and SOB is somewhat improved and he is off BIPAP during my evaluation.  02/11/20: Acute Respiratory Distress requiring emergent intubation 9/24 remains intubated 02/15/20- Weaning FiO2 , will d/c doxy and zosyn as patient completed 5d already and resp cutlures negative on tracheal aspirate 9/28- patient had respiratory distress during weaning trial today.  He is in SR now, ive dcd duOnebs and levophed to avoid recurrence of AFrVR. Plan to continue to wean down FiO2 and extubate when patient passing SBT.  02/17/20- patient is on mechanical ventilation, ive been able to meet with daughter at bedside. Patient was unable to pass SBT today went in to coughing with respiratory distress.  02/18/20- patient is at 60% on FIo2,  I was able to meet with daughter at bedside. Decreased dose of steroids today to 40 TID.  02/19/20-patient remains critically ill.  Repeat CXR this am with no significant changes.  02/20/20- Daughter at bedside this am, I have met with her, we discussed care plan and essentially no improvement x 5d already in ICU.   Unfortunately patient is with poor prognosis. Will ask PALS to evaluate.  02/21/20- unfortunately have not reached notable improvement on mechanical ventilation. Patient with poor prognosis  STUDIES:  9/20: CXR>>Widespread fibrosis, essentially stable. It is difficult to exclude a degree of underlying emphysematous change. The appearance is essentially stable compared to prior study. No consolidation evident. Heart upper normal in size. 9/20: CTA Chest>>1. No evidence of pulmonary embolism. 2. Progression of extensive bilateral interstitial and consolidative opacities, even compared to recent chest CT performed 12/11/2019, worrisome for progressive fibrotic lung disease, nonspecific though suggestive of UIP given probable honeycombing within the right lower lobe. 3. Improved aeration of previously noted consolidative opacities within the left upper lobe suggestive of resolved infection and/or inflammation. 4. Redemonstrated mediastinal and bilateral hilar lymphadenopathy, nonspecific though presumably reactive in etiology.  CULTURES: SARS-CoV-2 PCR 9/20>> negative Respiratory viral panel 9/20>> negative Blood culture x2 9/20>> no growth to date Urine 9/21>> no growth Sputum culture 9/21>> normal respiratory flora (no staph aureus or Pseudomonas) Sputum culture 9/23>>  ANTIBIOTICS: Zosyn  Doxycycline   CC follow up resp failure HPI End stage lung disease Severe resp failure Remains critically ill  Vent Mode: PCV FiO2 (%):  [60 %-65 %] 60 % Set Rate:  [34 bmp] 34 bmp PEEP:  [8 cmH20-10 cmH20] 10 cmH20 Plateau Pressure:  [32 cmH20-34 cmH20] 32 cmH20  CBC    Component Value Date/Time   WBC 27.2 (H) 02/21/2020 0502   RBC 3.45 (L) 02/21/2020 0502   HGB 10.0 (L) 02/21/2020 0502   HCT 33.9 (  L) 02/21/2020 0502   PLT 137 (L) 02/21/2020 0502   MCV 98.3 02/21/2020 0502   MCH 29.0 02/21/2020 0502   MCHC 29.5 (L) 02/21/2020 0502   RDW 14.6 02/21/2020 0502   LYMPHSABS 0.6 (L)  02/21/2020 0502   MONOABS 1.0 02/21/2020 0502   EOSABS 0.0 02/21/2020 0502   BASOSABS 0.1 02/21/2020 0502   BMP Latest Ref Rng & Units 02/21/2020 02/20/2020 02/19/2020  Glucose 70 - 99 mg/dL 193(H) 166(H) 152(H)  BUN 8 - 23 mg/dL 39(H) 30(H) 25(H)  Creatinine 0.61 - 1.24 mg/dL <0.30(L) 0.44(L) 0.32(L)  Sodium 135 - 145 mmol/L 141 142 142  Potassium 3.5 - 5.1 mmol/L 4.9 5.1 4.7  Chloride 98 - 111 mmol/L 98 98 97(L)  CO2 22 - 32 mmol/L 39(H) 41(H) 42(H)  Calcium 8.9 - 10.3 mg/dL 7.7(L) 8.4(L) 8.1(L)      REVIEW OF SYSTEMS  PATIENT IS UNABLE TO PROVIDE COMPLETE REVIEW OF SYSTEM S DUE TO SEVERE CRITICAL ILLNESS AND ENCEPHALOPATHY  VITAL SIGNS: Temp:  [97.4 F (36.3 C)-98.7 F (37.1 C)] 97.5 F (36.4 C) (10/03 1129) Pulse Rate:  [67-100] 67 (10/03 1129) Resp:  [22-38] 35 (10/03 1129) BP: (85-120)/(63-84) 99/64 (10/03 1129) SpO2:  [87 %-94 %] 94 % (10/03 1129) FiO2 (%):  [60 %-65 %] 60 % (10/03 1448)  PHYSICAL EXAMINATION:  GENERAL:critically ill appearing, +resp distress HEAD: Normocephalic, atraumatic.  EYES: Pupils equal, round, reactive to light.  No scleral icterus.  MOUTH: Moist mucosal membrane. NECK: Supple. No thyromegaly. No nodules. No JVD.  PULMONARY: +rhonchi, +wheezing CARDIOVASCULAR: S1 and S2. Regular rate and rhythm. No murmurs, rubs, or gallops.  GASTROINTESTINAL: Soft, nontender, -distended. Positive bowel sounds.  MUSCULOSKELETAL: No swelling, clubbing, or edema.  NEUROLOGIC: obtunded SKIN:intact,warm,dry    Recent Labs  Lab 02/19/20 0500 02/20/20 0419 02/21/20 0502  NA 142 142 141  K 4.7 5.1 4.9  CL 97* 98 98  CO2 42* 41* 39*  BUN 25* 30* 39*  CREATININE 0.32* 0.44* <0.30*  GLUCOSE 152* 166* 193*   Recent Labs  Lab 02/19/20 0500 02/20/20 0419 02/21/20 0502  HGB 10.4* 10.6* 10.0*  HCT 34.0* 35.2* 33.9*  WBC 22.4* 20.3* 27.2*  PLT 242 215 137*   No results found.  ASSESSMENT / PLAN:  Acute exacerbation of Idiopathic pulmonary  fibosis   - there is right upper lobe consolidation suggestive of acute infectious cause of current exacerbation-s/p CAP treatment with zosyn and vanco    - will obtain serology for ANA comprehensive, cryptococcal antigen, aspergillus ab, fungitell-negative    - Respiratory viral panel-negative     - sputum bacterial cultures-negative   - procalcitonin trend-monitoring   - MRSA PCR-negative   - empiric IV dose steroids -increased to solumedrol 80q8h>>60>>40tid>40bid  -patient worked up in clinic and has antifibrotic therapy ordered -esbriet approval in progress   Atrial fibrillation with Rapid ventricular response-resolved   - patient with HR> 150 this am  - on cardizem gtt   - BP bordeline low MAP 80s  - s/p cardio evaluation - appreciate input - follow recommendations - cardizem and digoxin   GERD   -this should be very tightly controlled while in acute exacerbation of IPF - will start protonix 40 bidGI GI PROPHYLAXIS as indicated   NUTRITIONAL STATUS DIET-->TF's as tolerated Constipation protocol as indicated   NEUROLOGY Acute toxic metabolic encephalopathy, need for sedation Goal RASS -2 to -3  Critical care provider statement:    Critical care time (minutes):  32   Critical care  time was exclusive of:  Separately billable procedures and  treating other patients   Critical care was necessary to treat or prevent imminent or  life-threatening deterioration of the following conditions:  acute on chronic hypoxemic respiratory failure, acute exacerbation of idiopathic pulmonary fibrosis.    Critical care was time spent personally by me on the following  activities:  Development of treatment plan with patient or surrogate,  discussions with consultants, evaluation of patient's response to  treatment, examination of patient, obtaining history from patient or  surrogate, ordering and performing treatments and interventions, ordering  and review of laboratory studies and  re-evaluation of patient's condition   I assumed direction of critical care for this patient from another  provider in my specialty: no     Critical Care Time devoted to patient care services described in this note is 32 minutes.   Overall, patient is critically ill, prognosis is guarded.  Patient with Multiorgan failure and at high risk for cardiac arrest and death.       Ottie Glazier, M.D.  Pulmonary & [place]

## 2020-02-22 ENCOUNTER — Inpatient Hospital Stay: Payer: BC Managed Care – PPO

## 2020-02-22 DIAGNOSIS — Z515 Encounter for palliative care: Secondary | ICD-10-CM | POA: Diagnosis not present

## 2020-02-22 DIAGNOSIS — J9621 Acute and chronic respiratory failure with hypoxia: Secondary | ICD-10-CM | POA: Diagnosis not present

## 2020-02-22 DIAGNOSIS — Z7189 Other specified counseling: Secondary | ICD-10-CM | POA: Diagnosis not present

## 2020-02-22 LAB — CBC WITH DIFFERENTIAL/PLATELET
Abs Immature Granulocytes: 1.24 10*3/uL — ABNORMAL HIGH (ref 0.00–0.07)
Basophils Absolute: 0.1 10*3/uL (ref 0.0–0.1)
Basophils Relative: 0 %
Eosinophils Absolute: 0 10*3/uL (ref 0.0–0.5)
Eosinophils Relative: 0 %
HCT: 29.7 % — ABNORMAL LOW (ref 39.0–52.0)
Hemoglobin: 9.7 g/dL — ABNORMAL LOW (ref 13.0–17.0)
Immature Granulocytes: 6 %
Lymphocytes Relative: 2 %
Lymphs Abs: 0.4 10*3/uL — ABNORMAL LOW (ref 0.7–4.0)
MCH: 31.5 pg (ref 26.0–34.0)
MCHC: 32.7 g/dL (ref 30.0–36.0)
MCV: 96.4 fL (ref 80.0–100.0)
Monocytes Absolute: 0.8 10*3/uL (ref 0.1–1.0)
Monocytes Relative: 4 %
Neutro Abs: 19.3 10*3/uL — ABNORMAL HIGH (ref 1.7–7.7)
Neutrophils Relative %: 88 %
Platelets: 107 10*3/uL — ABNORMAL LOW (ref 150–400)
RBC: 3.08 MIL/uL — ABNORMAL LOW (ref 4.22–5.81)
RDW: 15.1 % (ref 11.5–15.5)
WBC Morphology: INCREASED
WBC: 21.7 10*3/uL — ABNORMAL HIGH (ref 4.0–10.5)
nRBC: 0.2 % (ref 0.0–0.2)

## 2020-02-22 LAB — BASIC METABOLIC PANEL
Anion gap: 15 (ref 5–15)
BUN: 31 mg/dL — ABNORMAL HIGH (ref 8–23)
CO2: 26 mmol/L (ref 22–32)
Calcium: 7.5 mg/dL — ABNORMAL LOW (ref 8.9–10.3)
Chloride: 95 mmol/L — ABNORMAL LOW (ref 98–111)
Creatinine, Ser: <0.3 mg/dL — ABNORMAL LOW (ref 0.61–1.24)
Glucose, Bld: 209 mg/dL — ABNORMAL HIGH (ref 70–99)
Potassium: 4.8 mmol/L (ref 3.5–5.1)
Sodium: 136 mmol/L (ref 135–145)

## 2020-02-22 LAB — GLUCOSE, CAPILLARY
Glucose-Capillary: 167 mg/dL — ABNORMAL HIGH (ref 70–99)
Glucose-Capillary: 185 mg/dL — ABNORMAL HIGH (ref 70–99)
Glucose-Capillary: 144 mg/dL — ABNORMAL HIGH (ref 70–99)
Glucose-Capillary: 144 mg/dL — ABNORMAL HIGH (ref 70–99)

## 2020-02-22 LAB — TROPONIN I (HIGH SENSITIVITY)
Troponin I (High Sensitivity): 57 ng/L — ABNORMAL HIGH (ref <18)
Troponin I (High Sensitivity): 66 ng/L — ABNORMAL HIGH (ref <18)

## 2020-02-22 LAB — PROTIME-INR
INR: 1.7 — ABNORMAL HIGH (ref 0.8–1.2)
Prothrombin Time: 19.4 seconds — ABNORMAL HIGH (ref 11.4–15.2)

## 2020-02-22 LAB — PLATELET COUNT: Platelets: 115 10*3/uL — ABNORMAL LOW (ref 150–400)

## 2020-02-22 LAB — FIBRINOGEN: Fibrinogen: 371 mg/dL (ref 210–475)

## 2020-02-22 LAB — MAGNESIUM: Magnesium: 2.3 mg/dL (ref 1.7–2.4)

## 2020-02-22 LAB — PROCALCITONIN: Procalcitonin: <0.1 ng/mL

## 2020-02-22 LAB — PHOSPHORUS: Phosphorus: 2.1 mg/dL — ABNORMAL LOW (ref 2.5–4.6)

## 2020-02-22 LAB — HEMOGLOBIN AND HEMATOCRIT, BLOOD
HCT: 29.5 % — ABNORMAL LOW (ref 39.0–52.0)
Hemoglobin: 9.6 g/dL — ABNORMAL LOW (ref 13.0–17.0)

## 2020-02-22 LAB — APTT: aPTT: 30 seconds (ref 24–36)

## 2020-02-22 MED ORDER — METOPROLOL TARTRATE 5 MG/5ML IV SOLN
INTRAVENOUS | Status: AC
  Administered 2020-02-22: 5 mg via INTRAVENOUS
  Filled 2020-02-22: qty 5

## 2020-02-22 MED ORDER — FREE WATER
50.0000 mL | Status: DC
  Administered 2020-02-22 (×2): 50 mL

## 2020-02-22 MED ORDER — SODIUM PHOSPHATES 45 MMOLE/15ML IV SOLN
20.0000 mmol | Freq: Once | INTRAVENOUS | Status: AC
  Administered 2020-02-22: 20 mmol via INTRAVENOUS
  Filled 2020-02-22: qty 6.67

## 2020-02-22 MED ORDER — METOPROLOL TARTRATE 5 MG/5ML IV SOLN
2.5000 mg | Freq: Once | INTRAVENOUS | Status: AC
  Administered 2020-02-22: 2.5 mg via INTRAVENOUS
  Filled 2020-02-22: qty 5

## 2020-02-22 MED ORDER — METOPROLOL TARTRATE 5 MG/5ML IV SOLN: 5.0000 mg | Freq: Once | INTRAVENOUS | Status: AC

## 2020-02-22 MED ORDER — DIGOXIN 0.25 MG/ML IJ SOLN
0.1250 mg | INTRAMUSCULAR | Status: DC
  Administered 2020-02-22: 0.125 mg via INTRAVENOUS
  Filled 2020-02-22: qty 2

## 2020-02-22 MED ORDER — FUROSEMIDE 10 MG/ML IJ SOLN
40.0000 mg | Freq: Once | INTRAMUSCULAR | Status: AC
  Administered 2020-02-22: 40 mg via INTRAVENOUS
  Filled 2020-02-22: qty 4

## 2020-02-23 NOTE — Progress Notes (Signed)
Name: Donald Sellers MRN: [id number] DOB: [birth year]    ADMISSION DATE:  02/06/2020 CONSULTATION DATE:  02/11/2020  REFERRING MD :  Dr. Arbutus Ped  CHIEF COMPLAINT:  Acute Respiratory Distress  BRIEF PATIENT DESCRIPTION:  69 y.o. Male admitted with Acute on Chronic Hypoxic Respiratory Failure in the setting of Community Acquired Pneumonia and Acute Exacerbation of Idiopathic Pulmonary Fibrosis.  Required emergent intubation on 02/11/20.  SIGNIFICANT EVENTS  02/09/20- Patient in [place] during evaluation this am.  He was placed on BIPAP and is on cardizem gtt.  He may need closer monitoring since he is now on drip and NIV.  Will attempt to wean off both today and if unable to will advance care to SDU.  02/10/20- patient is improved, he has + bacterial respiratory culture with strep species.   His AFRVR is also improved and he has been seen by cariology.  He shares dyspnea and SOB is somewhat improved and he is off BIPAP during my evaluation.  02/11/20: Acute Respiratory Distress requiring emergent intubation 02/13/20: Remains intubated, mechanically ventilated, remains with very high FiO2 requirements  STUDIES:  9/20: CXR>>Widespread fibrosis, essentially stable. It is difficult to exclude a degree of underlying emphysematous change. The appearance is essentially stable compared to prior study. No consolidation evident. Heart upper normal in size. 9/20: CTA Chest>>1. No evidence of pulmonary embolism. 2. Progression of extensive bilateral interstitial and consolidative opacities, even compared to recent chest CT performed 12/11/2019, worrisome for progressive fibrotic lung disease, nonspecific though suggestive of UIP given probable honeycombing within the right lower lobe. 3. Improved aeration of previously noted consolidative opacities within the left upper lobe suggestive of resolved infection and/or inflammation. 4. Redemonstrated mediastinal and bilateral hilar  lymphadenopathy, nonspecific though presumably reactive in etiology.  CULTURES: SARS-CoV-2 PCR 9/20>> negative Respiratory viral panel 9/20>> negative Blood culture x2 9/20>> no growth to date Urine 9/21>> no growth Sputum culture 9/21>> normal respiratory flora (no staph aureus or Pseudomonas) Sputum culture 9/23>>  ANTIBIOTICS: Zosyn  Doxycycline  PATIENT PROFILE:   Donald Sellers is a 69 year old male with a past medical history significant for pulmonary fibrosis on 4 L/min home O2 who presented to [facility] ED on 01/29/2020 due to worsening shortness of breath from his baseline with increased O2 requirements and associated productive cough.  Of note he was recently diagnosed with pulmonary fibrosis in July 20 1:21 year of progressive shortness of breath.  Upon presentation to the ED he was noted to be hypoxic with O2 sats in the low 80s on his home 4 L of O2, which he was placed on nonrebreather mask.  Initial work-up in the ED showed leukocytosis with left shift, normal lactic acid.  CTA chest was negative for PE, did show progression of extensive bilateral interstitial and consolidative opacities which were worrisome for progressive fibrotic lung disease and questionable pneumonia.    He was admitted by the hospitalist for further work-up and treatment of acute on chronic hypoxic respiratory failure in the setting of community-acquired pneumonia and acute exacerbation of pulmonary fibrosis.  Pulmonary was consulted.  On 02/11/2020 he developed acute respiratory distress requiring transfer to ICU and subsequent intubation.  PCCM is consulted for further management.  No Known Allergies   Current inpatient medications reviewed.  REVIEW OF SYSTEMS:   Unable to assess due to critical illness, intubation and sedation  SUBJECTIVE:  Unable to assess due to critical illness, intubation and sedation  VITAL SIGNS: Temp:  [97.6 F (36.4 C)-97.8 F (36.6 C)] 97.6 F (36.4  C) (10/04  1700) Pulse Rate:  [29-132] 29 (10/04 1930) Resp:  [6-35] 6 (10/04 1945) BP: (92-136)/(65-75) 136/73 (10/04 1915) SpO2:  [4 %-96 %] 4 % (10/04 1930) FiO2 (%):  [80 %-100 %] 100 % (10/04 1839) Weight:  [80.5 kg] 80.5 kg (10/04 2100)   Ventilator parameters reviewed with RT  PHYSICAL EXAMINATION: General:  Critically ill appearing male, laying in bed, intubated and sedated, synchronous with the ventilator  Neuro:  Heavily sedated, pupils PERRLA HEENT:  Atraumatic, normocephalic, neck supple, no JVD Cardiovascular:  Tachycardia, irregularly irregular rhythm, no M/R/G, 1+ distal pulses Lungs:  Coarse breath sounds to auscultation bilaterally, vent assisted, even Abdomen:  Soft, nondistended,BS+ x4 Musculoskeletal:  Normal bulk and tone, no deformities, no edema Skin:  Warm and dry. No obvious rashes, lesions, or ulcerations   Laboratory data and chest x-ray reviewed.    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 Hx: Baseline 4-5 L O2 requirement -Continue full Vent support -Wean FiO2 & PEEP as tolerated to maintain O2 sats >92% -Follow intermittent CXR & ABG as needed -VAP Protocol -Spontaneous breathing trials when respiratory parameters met -Continue Steroids -Continue Doxycycline & Zosyn -PRN Bronchodilators -Not ready to wean still requiring very high FiO2   Severe Sepsis secondary to Community Acquired Pneumonia -Monitor fever curve -Trend WBC's & Procalcitonin -Follow cultures as above -Continue Zosyn & Doxycycline   Atrial Fibrillation w/ RVR -Continuous cardiac monitoring -Maintain MAP >65 -Cardiology following, appreciate input ~ follow up recommendations -Continue Cardizem & Heparin gtts -Avoiding amiodarone due to pulmonary fibrosis concerns   Hyperglycemia -CBG's -SSI -Follow ICU Hypo/Hyperglycemia  protocol   GERD -NPO -IV Protonix BID   Sedation needs in setting of mechanical ventilation -Maintain RASS goal 0 to -1 -Fentanyl and Propofol gtts as needed to maintain RASS goal -Avoid sedating meds as able -Daily wake up assessment -Provide supportive care    Pt is critically ill, prognosis is guarded.  He is at high risk for cardiac arrest and death.   BEST PRACTICES: DISPOSITION: ICU GOALS OF CARE: Full Code VTE PROPHYLAXIS/ANTICOAGULATION: Heparin gtt SUP: IV Protonix CONSULS: Cardiology, Dr. Humphrey Rolls UPDATES: As requested  Critical care time: 35 minutes.    Renold Don, MD [facility] 02/23/2020, 11:41 AM    *This note was dictated using voice recognition software/Dragon.  Despite best efforts to proofread, errors can occur which can change the meaning.  Any change was purely unintentional.

## 2020-02-23 NOTE — Progress Notes (Signed)
Called and talk to wife at midnight.  She doesn't know of the funeral home and told that patient placement will contact her tomorrow.

## 2020-02-23 NOTE — Progress Notes (Signed)
Obtained medications from pharmacy and sent funeral home in seal bag.  Oxygen tank and other items sent as well - three bags of personal belongings.  Wasted 225 ml of Neo-Syn and 50 ml of Fentanyl with Research scientist (medical).

## 2020-03-21 NOTE — Progress Notes (Signed)
Patient passed away before family could arrive.  Pronounced with house supervisor at [phone]

## 2020-03-21 NOTE — Progress Notes (Signed)
Patient had coded during shift change and passed away.  Wife was contacted by 1st shift RN.  All patients lines removed full intacted and bathed for family arrival.

## 2020-03-21 NOTE — Death Summary Note (Signed)
DEATH SUMMARY   Patient Details  Name: Donald Sellers MRN: [id number] DOB: 1950/07/05  Admission/Discharge Information   Admit Date:  February 22, 2020  Date of Death:  03-07-2020  Time of Death:  19:45  Length of Stay: 14  Referring Physician: Patient, No Pcp Per   Reason(s) for Hospitalization  Acute on Chronic Hypoxic Respiratory Failure Acute Exacerbation of Idiopathic Pulmonary Fibrosis Community Acquired Pneumonia Sepsis Atrial Fibrillation with RVR Acute Toxic Metabolic Encephalopathy  Diagnoses  Preliminary cause of death:   Acute on Chronic Hypoxic Respiratory Failure Secondary Diagnoses (including complications and co-morbidities):  Principal Problem:   Acute on chronic respiratory failure with hypoxia (HCC) Active Problems:   Gastroesophageal reflux disease   CAP (community acquired pneumonia)   Sepsis (HCC)   Pulmonary fibrosis (HCC)   Atrial fibrillation with RVR (HCC)   Goals of care, counseling/discussion   Palliative care by specialist   Brief Hospital Course (including significant findings, care, treatment, and services provided and events leading to death)   Donald Sellers is a 69 y.o. male with medical history significant for pulmonary fibrosis on 4 L of oxygen continuous who presents to the emergency room on 22-Feb-2020 for evaluation of worsening shortness of breath from his baseline and drop in his pulse oximetry on 4 L of oxygen with any form of exertion.  He has a cough productive of greenish phlegm.  On 4 L patient had pulse oximetry in the low 80s and is currently on a nonrebreather mask.  He has marked leukocytosis with a left shift and imaging is suggestive of possible pneumonia.  He received a dose of vancomycin and cefepime as well as IV Solu-Medrol in the ER.   He was admitted to the hospital for further evaluation and treatment of Acute on Chronic Hypoxic Respiratory Failure in the setting of Community Acquired Pneumonia and Acute Exacerbation of  Idiopathic Pulmonary Fibrosis.    He required emergent intubation on 02/11/20 due to Acute Respiratory Distress.  He was unable to tolerate subsequent spontaneous breathing trials due to respiratory distress.  On 03/07/20 he was noted to have atrial fibrillation with RVR, of which Cardiology was following and placed pt on digoxin.  Palliative Care was consulted to assist with goals of care discussion with family.   Late in the evening on March 07, 2020, he suddenly became bradycardic which rapidly progressed to asystole.  CPR and ACLS were begun immediately.  During resuscitative efforts he was noted to have large amounts of frank blood coming from his ET tube.  His family was notified during resuscitative efforts, and they elected to make him a DNR/DNI and to stop with resuscitative efforts.  He passed away shortly thereafter.   Pertinent Labs and Studies  Significant Diagnostic Studies DG Chest 1 View  Result Date: 02/09/2020 CLINICAL DATA:  Shortness of breath.  Cough. EXAM: CHEST  1 VIEW COMPARISON:  Chest x-ray 02/29/2020, 12/11/2019 CT 03/02/2020. FINDINGS: Mediastinum and hilar structures appear stable. Heart size appears stable. Diffuse severe interstitial changes are again noted again. Chronic interstitial lung disease again could present this fashion. Superimposed acute process including pneumonitis cannot be excluded. No pleural effusion or pneumothorax. IMPRESSION: Diffuse severe interstitial changes are again noted. Chronic interstitial lung disease again could present this fashion. Superimposed acute process including pneumonitis cannot be excluded. No significant interim change from prior studies. Electronically Signed   By: Maisie Fus  Register   On: 02/09/2020 08:25   CT Angio Chest PE W and/or Wo Contrast  Result Date: Feb 22, 2020 CLINICAL DATA:  Shortness of breath. Evaluate for pulmonary embolism. EXAM: CT ANGIOGRAPHY CHEST WITH CONTRAST TECHNIQUE: Multidetector CT imaging of the chest was  performed using the standard protocol during bolus administration of intravenous contrast. Multiplanar CT image reconstructions and MIPs were obtained to evaluate the vascular anatomy. CONTRAST:  75mL OMNIPAQUE IOHEXOL 350 MG/ML SOLN COMPARISON:  Chest CT-12/11/2019; chest radiograph-earlier same day; 12/16/2019 FINDINGS: Vascular Findings: There is adequate opacification of the pulmonary arterial system with the main pulmonary artery measuring 303 Hounsfield units. There are no discrete filling defects within the pulmonary arterial tree to suggest pulmonary embolism. Borderline enlarged caliber the main pulmonary artery measuring 3.3 cm in diameter (image 44, series 4). Cardiomegaly. No pericardial effusion. Coronary artery calcifications. No evidence of thoracic aortic aneurysm or dissection. Conventional configuration of the aortic arch. The branch vessels of the aortic arch appear patent throughout their imaged courses. There is a minimal amount of atherosclerotic plaque within the descending thoracic aorta, not resulting in a hemodynamically significant stenosis. Review of the MIP images confirms the above findings. ---------------------------------------------------------------------------------- Nonvascular Findings: Mediastinum/Lymph Nodes: Redemonstrated mediastinal and bilateral hilar lymphadenopathy with index high right posterior paratracheal lymph node measuring 1.1 cm in greatest short axis diameter image 20, series 4), index precarinal lymph node measuring 1.0 cm (image 36), index left suprahilar lymph node measuring 1.2 cm (image 41), index left infrahilar lymph node measuring 1.4 cm (image 52), and index right infrahilar lymph node measuring 1.4 cm (image 58), grossly unchanged compared to the 12/11/2019 examination. No axillary lymphadenopathy. Lungs/Pleura: Improved aeration of the previously noted consolidative opacities within left upper lobe suggestive resolved infection and/or inflammation.  Interval worsening extensive though basilar predominant interstitial and now consolidative airspace opacities with associated air bronchograms, progressed even compared to recent chest CT performed 11/2019. There is mild sparing of the left lung apex, with otherwise extensive involvement of the bilateral lungs with basilar predominance and questionable honeycombing within the subpleural aspect of the right lower lobe (representative image 64, series 6). No pleural effusion or pneumothorax. The central pulmonary airways appear patent. There is a punctate granuloma within the right lower lobe (image 69, series 6), unchanged. Evaluation for additional discrete pulmonary nodules is degraded secondary to extensive background parenchymal abnormalities. Upper abdomen: Limited early arterial phase evaluation of the upper abdomen demonstrates a splenule about the anterior tip of the spleen. Musculoskeletal: No acute or aggressive osseous abnormalities. Stigmata of dish within the thoracic spine. IMPRESSION: 1. No evidence of pulmonary embolism. 2. Progression of extensive bilateral interstitial and consolidative opacities, even compared to recent chest CT performed 12/11/2019, worrisome for progressive fibrotic lung disease, nonspecific though suggestive of UIP given probable honeycombing within the right lower lobe. 3. Improved aeration of previously noted consolidative opacities within the left upper lobe suggestive of resolved infection and/or inflammation. 4. Redemonstrated mediastinal and bilateral hilar lymphadenopathy, nonspecific though presumably reactive in etiology. Electronically Signed   By: Simonne Come M.D.   On: 02/11/2020 10:37   DG Chest Port 1 View  Result Date: 2020/03/06 CLINICAL DATA:  Acute respiratory failure. EXAM: PORTABLE CHEST 1 VIEW COMPARISON:  02/19/2020 FINDINGS: 0434 hours. Endotracheal tube tip is 4.7 cm above the base of the carina. The NG tube passes into the stomach although the distal  tip position is not included on the film. Left PICC line tip overlies the distal SVC. Cardiopericardial silhouette is at upper limits of normal for size. Diffuse interstitial and alveolar opacity is similar to prior. No substantial pleural effusion. Telemetry leads overlie the chest. IMPRESSION:  1. Stable exam. 2. Diffuse interstitial and alveolar opacity, similar to prior. Previous CT documents changes of pulmonary fibrosis with probable superimposed edema/infection today. Electronically Signed   By: Kennith CenterEric  Mansell M.D.   On: 03/16/2020 07:45   DG Chest Port 1 View  Result Date: 02/19/2020 CLINICAL DATA:  Abnormal chest x-ray.  Intubated patient. EXAM: PORTABLE CHEST 1 VIEW COMPARISON:  Most recent radiograph 02/13/2020. Most recent CT 11-15-19 FINDINGS: The endotracheal tube tip is 4 cm from the carina. Enteric tube in place with tip below the diaphragm not included in the field of view. Left upper extremity PICC in the SVC. Stable heart size and mediastinal contours. Stable appearance of diffuse ground-glass opacities throughout both lungs, bronchiectasis seen on prior CT. Previous subcutaneous emphysema in the neck has resolved. No visualized pneumothorax. No large pleural effusion. IMPRESSION: 1. Stable support apparatus. 2. No significant change in diffuse ground-glass opacities throughout both lungs, with bronchiectasis and fibrosis seen on prior CT. Electronically Signed   By: Narda RutherfordMelanie  Sanford M.D.   On: 02/19/2020 15:42   DG Chest Port 1 View  Result Date: 02/13/2020 CLINICAL DATA:  History of pulmonary fibrosis with shortness of breath EXAM: PORTABLE CHEST 1 VIEW COMPARISON:  02/12/2020 FINDINGS: Cardiac shadow is stable. Endotracheal tube, gastric catheter and left-sided PICC line are noted in satisfactory position. Diffuse airspace opacity is again identified and stable. Persistent subcutaneous emphysema is noted. The degree of pneumomediastinum has decreased somewhat. No pneumothorax is noted.  IMPRESSION: Tubes and lines as described above. Stable airspace opacities when compared with the prior exam. Electronically Signed   By: Alcide CleverMark  Lukens M.D.   On: 02/13/2020 12:19   DG Chest Port 1 View  Result Date: 02/12/2020 CLINICAL DATA:  Line placements EXAM: PORTABLE CHEST 1 VIEW COMPARISON:  02/12/2020 FINDINGS: An endotracheal tube is present with tip measuring 7.4 cm above the carina. Enteric tube tip is off the field of view but below the left hemidiaphragm. Proximal side hole is projected distal to the expected location of the EG junction. Left PICC line placed with tip over the cavoatrial junction region. There is evidence of new subcutaneous emphysema in the right supraclavicular region with new pneumo mediastinum. No definite pneumothorax although pneumothorax could be obscured by the right chest/neck process. Low lung volumes with diffuse interstitial pattern to the lungs. No pleural effusions. Cardiac enlargement. IMPRESSION: 1. Appliances appear in satisfactory position. 2. New subcutaneous emphysema in the right supraclavicular region with new pneumo mediastinum. 3. Cardiac enlargement. 4. Low lung volumes with diffuse interstitial pattern to the lungs. Critical Value/emergent results were called by telephone at the time of interpretation on 02/12/2020 at 8:31 pm to [facility], who verbally acknowledged these results. Electronically Signed   By: Burman NievesWilliam  Stevens M.D.   On: 02/12/2020 20:42   Portable Chest xray  Result Date: 02/12/2020 CLINICAL DATA:  Hypoxia EXAM: PORTABLE CHEST 1 VIEW COMPARISON:  February 11, 2020 chest radiograph and chest CT February 08, 2020 FINDINGS: Endotracheal tube tip is 4.4 cm from the carina. Nasogastric tube tip and side port are below the diaphragm. No pneumothorax. Widespread bilateral airspace opacity persists without appreciable change. Underlying changes of fibrosis noted. Heart size and pulmonary vascularity are within normal limits. No evident  adenopathy. No bone lesions. IMPRESSION: Tube positions as described without pneumothorax. Extensive multifocal airspace opacity bilaterally, superimposed on fibrosis. Suspect multifocal pneumonia, although pulmonary edema and/or ARDS superimposed may well be present. No new opacity evident. Stable cardiac silhouette. Electronically Signed   By: Chrissie NoaWilliam  Margarita Grizzle III M.D.   On: 02/12/2020 07:57   Portable Chest x-ray  Result Date: 02/11/2020 CLINICAL DATA:  Endotracheal tube placement. EXAM: PORTABLE CHEST 1 VIEW COMPARISON:  02/09/2020 FINDINGS: Endotracheal tube in good position. NG tube enters the stomach with the tip not visualized Severe diffuse bilateral airspace disease. Mild improvement in lung volume since the prior study. No pneumothorax or effusion IMPRESSION: Endotracheal tube in good position. Severe bilateral lung disease with improved lung volume. Electronically Signed   By: Marlan Palau M.D.   On: 02/11/2020 19:43   DG Chest Portable 1 View  Result Date: Feb 28, 2020 CLINICAL DATA:  Shortness of breath EXAM: PORTABLE CHEST 1 VIEW COMPARISON:  December 16, 2019 FINDINGS: Widespread fibrosis remains. No new opacity is appreciable. Heart is upper normal in size with pulmonary vascularity normal. No adenopathy. No bone lesions IMPRESSION: Widespread fibrosis, essentially stable. It is difficult to exclude a degree of underlying emphysematous change. The appearance is essentially stable compared to prior study. No consolidation evident. Heart upper normal in size. Electronically Signed   By: Bretta Bang III M.D.   On: 02/28/20 07:55   ECHOCARDIOGRAM COMPLETE  Result Date: 02/15/2020    ECHOCARDIOGRAM REPORT   Patient Name:   Donald Sellers Date of Exam: 02/14/2020 Medical Rec #:  [id number]       Height:       70.0 in Accession #:    [id number]      Weight:       177.4 lb Date of Birth:  May 12, 1951      BSA:          1.984 m Patient Age:    68 years        BP:           95/61 mmHg Patient  Gender: M               HR:           93 bpm. Exam Location:  [facility] Procedure: 2D Echo, Cardiac Doppler, Color Doppler and Intracardiac            Opacification Agent Indications:     I48.91 Atrial Fibrillation  History:         Patient has prior history of Echocardiogram examinations, most                  recent 12/14/2019. Risk Factors:Former Smoker. Pulmonary                  fibrosis.  Sonographer:     Sedonia Small Rodgers-Jones Referring Phys:  Carmelina Dane Diagnosing Phys: Adrian Blackwater MD  Sonographer Comments: Technically difficult study due to poor echo windows. IMPRESSIONS  1. Left ventricular ejection fraction, by estimation, is 60 to 65%. The left ventricle has normal function. The left ventricle has no regional wall motion abnormalities. There is moderate concentric left ventricular hypertrophy. Left ventricular diastolic parameters are consistent with Grade I diastolic dysfunction (impaired relaxation).  2. Right ventricular systolic function is normal. The right ventricular size is normal.  3. Left atrial size was mildly dilated.  4. Right atrial size was mildly dilated.  5. The mitral valve is normal in structure. Trivial mitral valve regurgitation. No evidence of mitral stenosis. Moderate to severe mitral annular calcification.  6. The aortic valve is normal in structure. Aortic valve regurgitation is not visualized. Mild aortic valve sclerosis is present, with no evidence of aortic valve stenosis.  7. The inferior vena cava is normal  in size with greater than 50% respiratory variability, suggesting right atrial pressure of 3 mmHg. FINDINGS  Left Ventricle: Left ventricular ejection fraction, by estimation, is 60 to 65%. The left ventricle has normal function. The left ventricle has no regional wall motion abnormalities. Definity contrast agent was given IV to delineate the left ventricular  endocardial borders. The left ventricular internal cavity size was normal in size. There is moderate  concentric left ventricular hypertrophy. Left ventricular diastolic parameters are consistent with Grade I diastolic dysfunction (impaired relaxation). Right Ventricle: The right ventricular size is normal. No increase in right ventricular wall thickness. Right ventricular systolic function is normal. Left Atrium: Left atrial size was mildly dilated. Right Atrium: Right atrial size was mildly dilated. Pericardium: There is no evidence of pericardial effusion. Mitral Valve: The mitral valve is normal in structure. Moderate to severe mitral annular calcification. Trivial mitral valve regurgitation. No evidence of mitral valve stenosis. Tricuspid Valve: The tricuspid valve is normal in structure. Tricuspid valve regurgitation is trivial. No evidence of tricuspid stenosis. Aortic Valve: The aortic valve is normal in structure. Aortic valve regurgitation is not visualized. Mild aortic valve sclerosis is present, with no evidence of aortic valve stenosis. Pulmonic Valve: The pulmonic valve was normal in structure. Pulmonic valve regurgitation is not visualized. No evidence of pulmonic stenosis. Aorta: The aortic root is normal in size and structure. Venous: The inferior vena cava is normal in size with greater than 50% respiratory variability, suggesting right atrial pressure of 3 mmHg. IAS/Shunts: No atrial level shunt detected by color flow Doppler.  LEFT VENTRICLE PLAX 2D LVIDd:         4.61 cm Diastology LVIDs:         3.25 cm LV e' medial:    9.70 cm/s LV PW:         0.77 cm LV E/e' medial:  8.2 LV IVS:        0.77 cm LV e' lateral:   9.88 cm/s                        LV E/e' lateral: 8.1  RIGHT VENTRICLE             IVC RV S prime:     16.40 cm/s  IVC diam: 2.42 cm TAPSE (M-mode): 0.8 cm LEFT ATRIUM             Index       RIGHT ATRIUM           Index LA diam:        4.30 cm 2.17 cm/m  RA Area:     13.50 cm LA Vol (A2C):   59.7 ml 30.10 ml/m RA Volume:   36.40 ml  18.35 ml/m LA Vol (A4C):   62.4 ml 31.46 ml/m LA  Biplane Vol: 61.2 ml 30.85 ml/m   AORTA Ao Root diam: 3.90 cm MITRAL VALVE               TRICUSPID VALVE MV Area (PHT): 4.31 cm    TR Peak grad:   44.1 mmHg MV Decel Time: 176 msec    TR Vmax:        332.00 cm/s MV E velocity: 79.70 cm/s MV A velocity: 61.70 cm/s MV E/A ratio:  1.29 Adrian Blackwater MD Electronically signed by Adrian Blackwater MD Signature Date/Time: 02/15/2020/9:01:16 AM    Final    [place] EKG SITE RITE  Result Date: 02/12/2020 If Site Rite image not attached,  placement could not be confirmed due to current cardiac rhythm.   Microbiology No results found for this or any previous visit (from the past 240 hour(s)).  Lab Basic Metabolic Panel: Recent Labs  Lab 02/18/20 0512 02/19/20 0500 02/20/20 0419 02/21/20 0502 03/08/2020 0500  NA 143 142 142 141 136  K 4.8 4.7 5.1 4.9 4.8  CL 99 97* 98 98 95*  CO2 34* 42* 41* 39* 26  GLUCOSE 185* 152* 166* 193* 209*  BUN 27* 25* 30* 39* 31*  CREATININE 0.41* 0.32* 0.44* <0.30* <0.30*  CALCIUM 8.1* 8.1* 8.4* 7.7* 7.5*  MG 2.4 2.4 2.3 2.3 2.3  PHOS 3.6 4.2 4.0 2.1* 2.1*   Liver Function Tests: No results for input(s): AST, ALT, ALKPHOS, BILITOT, PROT, ALBUMIN in the last 168 hours. No results for input(s): LIPASE, AMYLASE in the last 168 hours. No results for input(s): AMMONIA in the last 168 hours. CBC: Recent Labs  Lab 02/18/20 0512 02/18/20 0512 02/19/20 0500 02/20/20 0419 02/21/20 0502 03/19/2020 0500 02/23/2020 1451  WBC 18.5*  --  22.4* 20.3* 27.2* 21.7*  --   NEUTROABS 15.0*  --  18.8* 17.6* 23.6* 19.3*  --   HGB 10.4*   < > 10.4* 10.6* 10.0* 9.7* 9.6*  HCT 35.3*   < > 34.0* 35.2* 33.9* 29.7* 29.5*  MCV 96.7  --  96.6 98.1 98.3 96.4  --   PLT 259   < > 242 215 137* 107* 115*   < > = values in this interval not displayed.   Cardiac Enzymes: No results for input(s): CKTOTAL, CKMB, CKMBINDEX, TROPONINI in the last 168 hours. Sepsis Labs: Recent Labs  Lab 02/19/20 0500 02/20/20 0419 02/21/20 0502 03/02/2020 0500  02/24/2020 0542  PROCALCITON  --   --   --   --  <0.10  WBC 22.4* 20.3* 27.2* 21.7*  --     Procedures/Operations  Endotracheal Intubation 02/11/20 Left Brachial PICC Placement 02/12/20        Harlon Ditty, AGACNP-BC [facility] Pager: [phone]  Judithe Modest 03/14/2020, 8:15 PM

## 2020-03-21 NOTE — Consult Note (Signed)
Consultation Note Date: 03/10/20   Patient Name: Donald Sellers  DOB: 05/16/1951  MRN: [id number]  Age / Sex: 69 y.o., male  PCP: Donald Sellers Referring Physician: Tyler Pita, MD  Reason for Consultation: Establishing goals of care  HPI/Patient Profile: 69 y.o. male  with past medical history of pulmonary fibrosis on 4 L oxygen at home admitted on 02/02/2020 with worsening shortness of breath. He required emergent intubation on 02/11/20. Has not been able to tolerate SBTs, essentially no improvement.  HR elevated in 130s in a fib. PMT consulted for Donald Sellers discussion.   Clinical Assessment and Goals of Care: I have reviewed medical records including EPIC notes, labs and imaging, received report from RN, assessed the patient and then met with patient's daughter, Donald Sellers,  to discuss diagnosis prognosis, GOC, EOL wishes, disposition and options.  Donald Sellers shares that she and her mother/patient's wife have been alternating days visiting the patient and today is her day; however, patient's wife Donald Sellers is Copy.  I introduced Palliative Medicine as specialized medical care for people living with serious illness. It focuses on providing relief from the symptoms and stress of a serious illness. The goal is to improve quality of life for both the patient and the family.  We discussed a brief life review of the patient. Patient goes by "Donald Sellers". Donald Sellers shares patient works in Engineer, technical sales at [employer] She tells me he is somewhat religious.   As far as functional and nutritional status, she tells me he was doing very well until about July of this year when he was hospitalized and diagnosed with pulm fibrosis. Since that time he has been on oxygen at home and sustained a fall late July/august tripping over oxygen tubing - he fractured his ankle and required OR for treatment. He was able to ambulate and care for himself independently. He  maintained good appetite.    We discussed patient's current illness and what it means in the larger context of patient's on-going co-morbidities.  Natural disease trajectory and expectations at EOL were discussed. Discussed concerns about his continued dependence on mechanical ventilation. Discussed need to consider how to move forward. Donald Sellers asks "like stopping what we are doing?" We discuss that could be part of the conversation. Briefly discussed what long-term support would involve including tracheostomy.   I attempted to elicit values and goals of care important to the patient.  Donald Sellers shares she is not sure what her dad's wishes would be given current circumstances. She shares she knows he "would not want to be a vegetable".   We planned a goals of care discussion to include daughter and wife tomorrow morning (10/5) at 9 am.   Daughter was tearful throughout conversation. Emotional support was provided. Chaplain support offered but she shares she is not religious. She does state patient may appreciate prayer as he is "the most religious of all of [place]".   Questions and concerns were addressed. The family was encouraged to call with questions or concerns.   Primary Decision Maker NEXT OF KIN - wife Donald Sellers   SUMMARY OF RECOMMENDATIONS   - [place] discussion with wife and daughter scheduled for 10/5 at 9 am  Code Status/Advance Care Planning:  Full code  Prognosis:   Unable to determine  Discharge Planning: To Be Determined      Primary Diagnoses: Present on Admission: . CAP (community acquired pneumonia) . Sepsis ([place]) . Gastroesophageal reflux disease . Pulmonary fibrosis ([place]) . Acute on chronic respiratory  failure with hypoxia ([place]) . Atrial fibrillation with RVR ([place])   I have reviewed the medical record, interviewed the patient and family, and examined the patient. The following aspects are pertinent.  Past Medical History:  Diagnosis Date  . Patient denies  medical problems   . Pulmonary fibrosis ([facility])    Social History   Socioeconomic History  . Marital status: Married    Spouse name: Not on file  . Number of children: Not on file  . Years of education: Not on file  . Highest education level: Not on file  Occupational History  . Not on file  Tobacco Use  . Smoking status: Former Smoker    Packs/day: 1.00    Years: 11.00    Pack years: 11.00    Types: Cigarettes    Quit date: 1980    Years since quitting: 41.7  . Smokeless tobacco: Never Used  Vaping Use  . Vaping Use: Never used  Substance and Sexual Activity  . Alcohol use: Yes  . Drug use: Never  . Sexual activity: Not Currently    Birth control/protection: None  Other Topics Concern  . Not on file  Social History Narrative  . Not on file   Social Determinants of Health   Financial Resource Strain:   . Difficulty of Paying Living Expenses: Not on file  Food Insecurity:   . Worried About Charity fundraiser in the Last Year: Not on file  . Ran Out of Food in the Last Year: Not on file  Transportation Needs:   . Lack of Transportation (Medical): Not on file  . Lack of Transportation (Non-Medical): Not on file  Physical Activity:   . Days of Exercise Sellers Week: Not on file  . Minutes of Exercise Sellers Session: Not on file  Stress:   . Feeling of Stress : Not on file  Social Connections:   . Frequency of Communication with Friends and Family: Not on file  . Frequency of Social Gatherings with Friends and Family: Not on file  . Attends Religious Services: Not on file  . Active Member of Clubs or Organizations: Not on file  . Attends Archivist Meetings: Not on file  . Marital Status: Not on file   Family History  Problem Relation Age of Onset  . Pulmonary fibrosis Neg Hx    Scheduled Meds: . apixaban  5 mg Sellers Tube BID  . chlorhexidine gluconate (MEDLINE KIT)  15 mL Mouth Rinse BID  . Chlorhexidine Gluconate Cloth  6 each Topical Daily  . digoxin   0.125 mg Intravenous QODAY  . feeding supplement (PROSource TF)  45 mL Sellers Tube BID  . free water  50 mL Sellers Tube Q4H  . insulin aspart  0-15 Units Subcutaneous Q4H  . mouth rinse  15 mL Mouth Rinse 10 times Sellers day  . methylPREDNISolone (SOLU-MEDROL) injection  40 mg Intravenous TID  . pantoprazole (PROTONIX) IV  40 mg Intravenous Q24H  . sodium chloride flush  10-40 mL Intracatheter Q12H   Continuous Infusions: . sodium chloride Stopped (02/13/20 0029)  . dexmedetomidine (PRECEDEX) IV infusion Stopped (02/11/20 1840)  . feeding supplement (VITAL HIGH PROTEIN) 50 mL/hr at 02/21/20 2353  . fentaNYL infusion INTRAVENOUS 375 mcg/hr (Mar 19, 2020 1238)  . phenylephrine (NEO-SYNEPHRINE) Adult infusion 20 mcg/min (03-19-20 0911)  . propofol (DIPRIVAN) infusion 70 mcg/kg/min (19-Mar-2020 1241)  . sodium phosphate  Dextrose 5% IVPB 20 mmol (2020/03/19 1230)   PRN Meds:.sodium chloride, LORazepam, sodium chloride, sodium  chloride flush No Known Allergies Review of Systems  Unable to perform ROS: Intubated    Physical Exam Constitutional:      Comments: Intubated, heavily sedated  Cardiovascular:     Rate and Rhythm: Tachycardia present. Rhythm irregular.  Musculoskeletal:     Right lower leg: Edema present.     Left lower leg: Edema present.     Comments: Edematous upper extremities     Vital Signs: BP 92/66 (BP Location: Right Arm)   Pulse (!) 132   Temp 97.8 F (36.6 C) (Axillary)   Resp (!) 35   Ht 5' 10"  (1.778 m)   Wt 80.5 kg   SpO2 90%   BMI 25.45 kg/m  Pain Scale: CPOT   Pain Score: 0-No pain   SpO2: SpO2: 90 % O2 Device:SpO2: 90 % O2 Flow Rate: .O2 Flow Rate (L/min): 55 L/min  IO: Intake/output summary:   Intake/Output Summary (Last 24 hours) at 2020-03-13 1322 Last data filed at 2020-03-13 1243 Gross Sellers 24 hour  Intake 10420.58 ml  Output 1850 ml  Net 8570.58 ml    LBM: Last BM Date: 02/10/20 Baseline Weight: Weight: 77.9 kg Most recent weight: Weight:   (patient could not be weight at the time. nurse notified.)     Palliative Assessment/Data:  PPS 30% with tube feeding    Time Total: 50 minutes Greater than 50%  of this time was spent counseling and coordinating care related to the above assessment and plan.  Juel Burrow, DNP, AGNP-C Palliative Medicine Team [phone] Pager: [phone]

## 2020-03-21 NOTE — Progress Notes (Signed)
Name: Donald Sellers MRN: [id number] DOB: 24-Aug-1950    ADMISSION DATE:  02/14/2020 CONSULTATION DATE:  02/11/2020  REFERRING MD :  Dr. Arbutus Ped  CHIEF COMPLAINT:  Acute Respiratory Distress  BRIEF PATIENT DESCRIPTION:  69 y.o. Male admitted with Acute on Chronic Hypoxic Respiratory Failure in the setting of Community Acquired Pneumonia and Acute Exacerbation of Idiopathic Pulmonary Fibrosis.  Required emergent intubation on 02/11/20.  SIGNIFICANT EVENTS  02/09/20- Patient in [place] during evaluation this am.  He was placed on BIPAP and is on cardizem gtt.  He may need closer monitoring since he is now on drip and NIV.  Will attempt to wean off both today and if unable to will advance care to SDU.  02/10/20- patient is improved, he has + bacterial respiratory culture with strep species.   His AFRVR is also improved and he has been seen by cariology.  He shares dyspnea and SOB is somewhat improved and he is off BIPAP during my evaluation.  02/11/20: Acute Respiratory Distress requiring emergent intubation 9/24 remains intubated 02/15/20- Weaning FiO2 , will d/c doxy and zosyn as patient completed 5d already and resp cutlures negative on tracheal aspirate 9/28- patient had respiratory distress during weaning trial today.  He is in SR now, ive dcd duOnebs and levophed to avoid recurrence of AFrVR. Plan to continue to wean down FiO2 and extubate when patient passing SBT.  02/17/20- patient is on mechanical ventilation, ive been able to meet with daughter at bedside. Patient was unable to pass SBT today went in to coughing with respiratory distress.  02/18/20- patient is at 60% on FIo2,  I was able to meet with daughter at bedside. Decreased dose of steroids today to 40 TID.  02/19/20-patient remains critically ill.  Repeat CXR this am with no significant changes.  02/20/20- Daughter at bedside this am, I have met with her, we discussed care plan and essentially no improvement x 5d already in ICU.   Unfortunately patient is with poor prognosis. Will ask PALS to evaluate.  02/21/20- unfortunately have not reached notable improvement on mechanical ventilation. Patient with poor prognosis 2020-03-08- Patient remains critically ill. Heart rate increasing to the 130s in atrial fibrillation, starting digoxin 0.125 IV every other day as per cardiology  STUDIES:  9/20: CXR>>Widespread fibrosis, essentially stable. It is difficult to exclude a degree of underlying emphysematous change. The appearance is essentially stable compared to prior study. No consolidation evident. Heart upper normal in size. 9/20: CTA Chest>>1. No evidence of pulmonary embolism. 2. Progression of extensive bilateral interstitial and consolidative opacities, even compared to recent chest CT performed 12/11/2019, worrisome for progressive fibrotic lung disease, nonspecific though suggestive of UIP given probable honeycombing within the right lower lobe. 3. Improved aeration of previously noted consolidative opacities within the left upper lobe suggestive of resolved infection and/or inflammation. 4. Redemonstrated mediastinal and bilateral hilar lymphadenopathy, nonspecific though presumably reactive in etiology.  CULTURES: SARS-CoV-2 PCR 9/20>> negative Respiratory viral panel 9/20>> negative Blood culture x2 9/20>> no growth to date Urine 9/21>> no growth Sputum culture 9/21>> normal respiratory flora (no staph aureus or Pseudomonas) Sputum culture 9/23>>  ANTIBIOTICS: Zosyn  Doxycycline   CC follow up resp failure HPI End stage lung disease Severe resp failure Remains critically ill  Vent Mode: PCV FiO2 (%):  [60 %-65 %] 60 % Set Rate:  [34 bmp] 34 bmp PEEP:  [10 cmH20] 10 cmH20 Plateau Pressure:  [33 cmH20-35 cmH20] 35 cmH20  CBC    Component Value Date/Time  WBC 21.7 (H) 03-06-20 0500   RBC 3.08 (L) 06-Mar-2020 0500   HGB 9.7 (L) 03-06-20 0500   HCT 29.7 (L) 03/06/2020 0500   PLT 107 (L)  2020/03/06 0500   MCV 96.4 03/06/2020 0500   MCH 31.5 06-Mar-2020 0500   MCHC 32.7 03/06/2020 0500   RDW 15.1 03/06/20 0500   LYMPHSABS 0.4 (L) 03-06-2020 0500   MONOABS 0.8 03-06-2020 0500   EOSABS 0.0 2020/03/06 0500   BASOSABS 0.1 Mar 06, 2020 0500   BMP Latest Ref Rng & Units 03/06/20 02/21/2020 02/20/2020  Glucose 70 - 99 mg/dL 209(H) 193(H) 166(H)  BUN 8 - 23 mg/dL 31(H) 39(H) 30(H)  Creatinine 0.61 - 1.24 mg/dL <0.30(L) <0.30(L) 0.44(L)  Sodium 135 - 145 mmol/L 136 141 142  Potassium 3.5 - 5.1 mmol/L 4.8 4.9 5.1  Chloride 98 - 111 mmol/L 95(L) 98 98  CO2 22 - 32 mmol/L 26 39(H) 41(H)  Calcium 8.9 - 10.3 mg/dL 7.5(L) 7.7(L) 8.4(L)      REVIEW OF SYSTEMS  PATIENT IS UNABLE TO PROVIDE COMPLETE REVIEW OF SYSTEM S DUE TO SEVERE CRITICAL ILLNESS AND ENCEPHALOPATHY  VITAL SIGNS: Temp:  [97.5 F (36.4 C)-98 F (36.7 C)] 97.8 F (36.6 C) (10/03 2300) Pulse Rate:  [67-146] 123 (10/04 0645) Resp:  [19-40] 34 (10/04 0645) BP: (79-166)/(63-104) 89/63 (10/04 0645) SpO2:  [86 %-95 %] 94 % (10/04 0645) FiO2 (%):  [60 %-65 %] 60 % (10/04 0248)  PHYSICAL EXAMINATION:  GENERAL:critically ill appearing, +resp distress HEAD: Normocephalic, atraumatic.  EYES: Pupils equal, round, reactive to light.  No scleral icterus.  MOUTH: Moist mucosal membrane. NECK: Supple. No thyromegaly. No nodules. No JVD.  PULMONARY: +rhonchi, +wheezing CARDIOVASCULAR: S1 and S2. Regular rate and rhythm. No murmurs, rubs, or gallops.  GASTROINTESTINAL: Soft, nontender, -distended. Positive bowel sounds.  MUSCULOSKELETAL: No swelling, clubbing, or edema.  NEUROLOGIC: obtunded SKIN:intact,warm,dry    Recent Labs  Lab 02/20/20 0419 02/21/20 0502 03-06-2020 0500  NA 142 141 136  K 5.1 4.9 4.8  CL 98 98 95*  CO2 41* 39* 26  BUN 30* 39* 31*  CREATININE 0.44* <0.30* <0.30*  GLUCOSE 166* 193* 209*   Recent Labs  Lab 02/20/20 0419 02/21/20 0502 2020/03/06 0500  HGB 10.6* 10.0* 9.7*  HCT 35.2*  33.9* 29.7*  WBC 20.3* 27.2* 21.7*  PLT 215 137* 107*   DG Chest Port 1 View  Result Date: 03/06/2020 CLINICAL DATA:  Acute respiratory failure. EXAM: PORTABLE CHEST 1 VIEW COMPARISON:  02/19/2020 FINDINGS: 0434 hours. Endotracheal tube tip is 4.7 cm above the base of the carina. The NG tube passes into the stomach although the distal tip position is not included on the film. Left PICC line tip overlies the distal SVC. Cardiopericardial silhouette is at upper limits of normal for size. Diffuse interstitial and alveolar opacity is similar to prior. No substantial pleural effusion. Telemetry leads overlie the chest. IMPRESSION: 1. Stable exam. 2. Diffuse interstitial and alveolar opacity, similar to prior. Previous CT documents changes of pulmonary fibrosis with probable superimposed edema/infection today. Electronically Signed   By: Misty Stanley M.D.   On: 2020/03/06 07:45    ASSESSMENT / PLAN:  Acute exacerbation of Idiopathic pulmonary fibosis   - there is right upper lobe consolidation suggestive of acute infectious cause of current exacerbation-s/p CAP treatment with zosyn and vanco    - will obtain serology for ANA comprehensive, cryptococcal antigen, aspergillus ab, fungitell-negative    - Respiratory viral panel-negative     - sputum bacterial cultures-negative   -  procalcitonin trend-monitoring   - MRSA PCR-negative   - empiric IV dose steroids -increased to solumedrol 80q8h>>60>>40tid>40bid  -patient worked up in clinic and has antifibrotic therapy ordered -esbriet approval in progress   Atrial fibrillation with Rapid ventricular response-resolved   - patient with HR> 130 this am  - started on digoxin 0.125 IV every other day per cards, previously on cardizem gtt   - BP bordeline low MAP 80s  - s/p cardio evaluation - appreciate input - follow recommendations - cardizem and digoxin   GERD   -this should be very tightly controlled while in acute exacerbation of IPF - will  start protonix 40 bidGI GI PROPHYLAXIS as indicated   NUTRITIONAL STATUS DIET-->TF's as tolerated Constipation protocol as indicated   NEUROLOGY Acute toxic metabolic encephalopathy, need for sedation Goal RASS -2 to -3  Critical care provider statement:    Critical care time (minutes):  32   Critical care time was exclusive of:  Separately billable procedures and  treating other patients   Critical care was necessary to treat or prevent imminent or  life-threatening deterioration of the following conditions:  acute on chronic hypoxemic respiratory failure, acute exacerbation of idiopathic pulmonary fibrosis.    Critical care was time spent personally by me on the following  activities:  Development of treatment plan with patient or surrogate,  discussions with consultants, evaluation of patient's response to  treatment, examination of patient, obtaining history from patient or  surrogate, ordering and performing treatments and interventions, ordering  and review of laboratory studies and re-evaluation of patient's condition   I assumed direction of critical care for this patient from another  provider in my specialty: no     Critical Care Time devoted to patient care services described in this note is 32 minutes.   Overall, patient is critically ill, prognosis is guarded.  Patient with Multiorgan failure and at high risk for cardiac arrest and death.      Jorje Guild, M.D., M.P.H.  Pulmonary & Critical Care Medicine

## 2020-03-21 NOTE — Progress Notes (Signed)
   03-16-20 1900  Clinical Encounter Type  Visited With Health care provider  Visit Type Initial;Code  Referral From Nurse  Consult/Referral To Chaplain  Spiritual Encounters  Spiritual Needs Prayer  Chaplain received page for code blue and made pastoral presence known. Care team work with patient and family was called. Chaplain maintain presence with silent prayer until care team stop working on patient. Chaplain will return if family needs the Chaplain.

## 2020-03-21 NOTE — Plan of Care (Signed)
Discussed in front of patient with RT and on-call MD plan of care for the evening, need for sedation and paralytic to stay in sync with the ventilator with no evidence of learning for the patient.  Patient may have to be plane in prone position.  ABG obtained by RT when SpO2 was less than 75%.

## 2020-03-21 NOTE — Significant Event (Addendum)
Mr. Imhof lost his pulse and CPR was initiated. I called his family and spoke to Mr. Eckerman wife, Mrs. Tyren Dugar. I explained that Mr. Higinbotham heart had stopped beating and that the medical team was actively performing CPR. I also explained that the likelihood of being able to have meaningful recovery after CPR was very low and that he was actively dying. At this time Mr. Mo additionally started having bloody secretions from his ET tube during chest compressions. Mrs. Zorn stated understanding of the situation and agreed with the recommendation of transitioning to DNR. Compressions were stopped.

## 2020-03-21 DEATH — deceased

## 2021-10-02 IMAGING — DX DG CHEST 1V PORT
1 series · 1 of 1 positions shown · non-contrast
Comparison: None.

CLINICAL DATA: Shortness of breath

EXAM:
PORTABLE CHEST 1 VIEW

[chest ap]
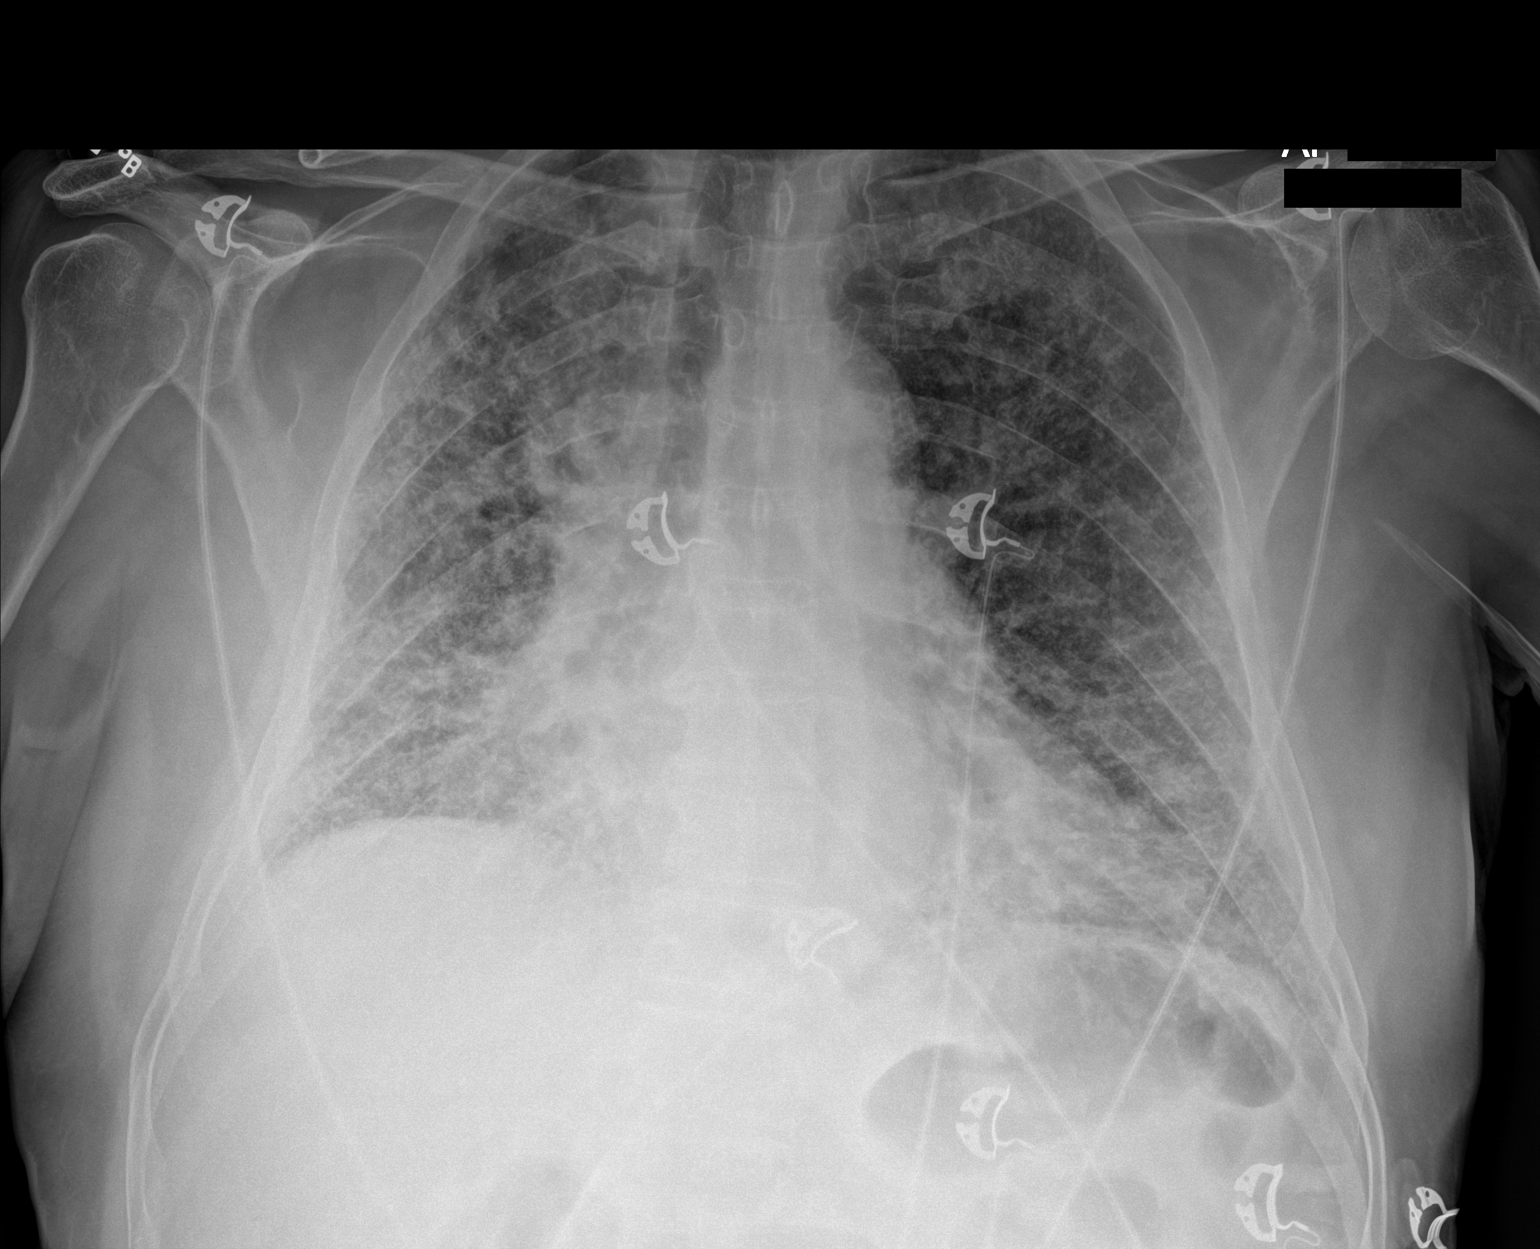

[1 of 1 positions shown; findings below may reference images not displayed]

FINDINGS: Heart size is within normal limits. Low lung volumes. Extensive
bilateral interstitial opacities throughout both lungs. No large
pleural fluid collection. No pneumothorax.
IMPRESSION: Extensive bilateral interstitial opacities throughout both lungs.
Findings may represent pulmonary edema versus multifocal
atypical/viral infection.
